# Patient Record
Sex: Male | Born: 1955 | Race: Asian | Hispanic: No | Marital: Married
Health system: Midwestern US, Community
[De-identification: ages and names within clinical notes are randomized; demographics above are authoritative.]

## PROBLEM LIST (undated history)

## (undated) DIAGNOSIS — R079 Chest pain, unspecified: Principal | ICD-10-CM

## (undated) DIAGNOSIS — L4050 Arthropathic psoriasis, unspecified: Secondary | ICD-10-CM

## (undated) DIAGNOSIS — M5030 Other cervical disc degeneration, unspecified cervical region: Secondary | ICD-10-CM

## (undated) DIAGNOSIS — I2510 Atherosclerotic heart disease of native coronary artery without angina pectoris: Secondary | ICD-10-CM

## (undated) DIAGNOSIS — T884XXA Failed or difficult intubation, initial encounter: Secondary | ICD-10-CM

## (undated) DIAGNOSIS — Z955 Presence of coronary angioplasty implant and graft: Secondary | ICD-10-CM

## (undated) DIAGNOSIS — Z87898 Personal history of other specified conditions: Secondary | ICD-10-CM

## (undated) DIAGNOSIS — Z8679 Personal history of other diseases of the circulatory system: Secondary | ICD-10-CM

## (undated) DIAGNOSIS — L409 Psoriasis, unspecified: Secondary | ICD-10-CM

## (undated) DIAGNOSIS — B0229 Other postherpetic nervous system involvement: Secondary | ICD-10-CM

## (undated) DIAGNOSIS — R1013 Epigastric pain: Secondary | ICD-10-CM

## (undated) DIAGNOSIS — R0789 Other chest pain: Secondary | ICD-10-CM

## (undated) DIAGNOSIS — Z0000 Encounter for general adult medical examination without abnormal findings: Secondary | ICD-10-CM

## (undated) DIAGNOSIS — R109 Unspecified abdominal pain: Secondary | ICD-10-CM

## (undated) DIAGNOSIS — M792 Neuralgia and neuritis, unspecified: Secondary | ICD-10-CM

## (undated) DIAGNOSIS — R202 Paresthesia of skin: Secondary | ICD-10-CM

## (undated) DIAGNOSIS — Z7184 Encounter for health counseling related to travel: Secondary | ICD-10-CM

## (undated) DIAGNOSIS — Z1211 Encounter for screening for malignant neoplasm of colon: Secondary | ICD-10-CM

## (undated) HISTORY — DX: Atherosclerotic heart disease of native coronary artery without angina pectoris: I25.10

## (undated) HISTORY — DX: Personal history of other specified conditions: Z87.898

## (undated) HISTORY — DX: Psoriasis, unspecified: L40.9

## (undated) HISTORY — DX: Other cervical disc degeneration, unspecified cervical region: M50.30

## (undated) HISTORY — PX: HERNIA REPAIR: SHX51

## (undated) HISTORY — DX: Arthropathic psoriasis, unspecified: L40.50

## (undated) HISTORY — DX: Personal history of other diseases of the circulatory system: Z86.79

## (undated) HISTORY — DX: Presence of coronary angioplasty implant and graft: Z95.5

## (undated) HISTORY — PX: COLONOSCOPY: SHX174

## (undated) HISTORY — PX: UPPER GI ENDOSCOPY: SHX6162

---
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
## 1995-11-04 HISTORY — PX: THORACOTOMY: SUR1349

## 1999-11-04 HISTORY — PX: PILONIDAL CYST / SINUS EXCISION: SUR543

## 2002-11-03 HISTORY — PX: HEMORRHOID SURGERY: SHX153

## 2007-11-04 HISTORY — PX: CYSTOSCOPY: SUR368

## 2012-04-27 MED ORDER — ETANERCEPT 50 MG/ML (0.98 ML) SUB-Q SYRINGE
50 mg/mL (1 mL) | INJECTION | SUBCUTANEOUS | Status: DC
Start: 2012-04-27 — End: 2018-01-01

## 2012-04-27 NOTE — Patient Instructions (Signed)
Well Visit, Men 50 to 65: After Your Visit  Your Care Instructions  Physical exams can help you stay healthy. Your doctor has checked your overall health and may have suggested ways to take good care of yourself. He or she also may have recommended tests. At home, you can help prevent illness with healthy eating, regular exercise, and other steps.  Follow-up care is a key part of your treatment and safety. Be sure to make and go to all appointments, and call your doctor if you are having problems. It's also a good idea to know your test results and keep a list of the medicines you take.  How can you care for yourself at home?  ?? Reach and stay at a healthy weight. This will lower your risk for many problems, such as obesity, diabetes, heart disease, and high blood pressure.   ?? Get at least 30 minutes of exercise on most days of the week. Walking is a good choice. You also may want to do other activities, such as running, swimming, cycling, or playing tennis or team sports.   ?? Do not smoke. Smoking can make health problems worse. If you need help quitting, talk to your doctor about stop-smoking programs and medicines. These can increase your chances of quitting for good.   ?? Always wear sunscreen on exposed skin. Make sure the sunscreen blocks ultraviolet rays (both UVA and UVB) and has a sun protection factor (SPF) of at least 15. Use it every day, even when it is cloudy. Some doctors may recommend a higher SPF, such as 30.   ?? See a dentist one or two times a year for checkups and to have your teeth cleaned.   ?? Wear a seat belt in the car.   ?? Limit alcohol to 2 drinks a day. Too much alcohol can cause health problems.   Follow your doctor's advice about when to have certain tests. These tests can spot problems early.  ?? Cholesterol. Your doctor will tell you how often to have this done based on your overall health and other things that can increase your risk for heart disease. Usually, an adult who has  coronary artery disease or diabetes should have cholesterol testing at least once a year. If you are being treated for high cholesterol, how often you have tests depends on your cholesterol level and the type of treatment.   ?? Blood pressure. Experts suggest that healthy adults with normal blood pressure (119/79 mm Hg or below) have their blood pressure checked at least every 1 to 2 years. This can be done during a routine doctor visit. If you have slightly higher or high blood pressure, or are at risk for heart disease, your doctor will suggest more frequent tests.   ?? Prostate exam. Talk to your doctor about whether and how often you should have a blood test (called a PSA test) for prostate cancer. Experts differ on how often men should have this test. They recommend that you discuss the benefits and risks of the test with your doctor.   ?? Diabetes. Ask your doctor whether you should have tests for diabetes.   ?? Vision. Some experts recommend that you have yearly exams for glaucoma and other age-related eye problems starting at age 50.   ?? Hearing. Tell your doctor if you notice any change in your hearing. You can have tests to find out how well you hear.   ?? Colon cancer. You should begin tests for colon cancer   at age 50. You may have one of several tests. Your doctor will tell you how often to have tests based on your age and risk. Risks include whether you already had a precancerous polyp removed from your colon or whether your parent, brother, sister, or child has had colon cancer.   ?? Coronary artery disease. Every 5 years, you should have your risks for heart disease assessed. This test uses factors such as your age, blood pressure, cholesterol, and whether you smoke or have diabetes to show what your risk for a heart attack is over the next 10 years.   ?? Abdominal aortic aneurysm. Ask your doctor whether you should have a test to check for an aneurysm. You may need a test if you ever smoked or if your  parent, brother, sister, or child has had an aneurysm.   When should you call for help?  Watch closely for changes in your health, and be sure to contact your doctor if you have any problems or symptoms that concern you.    Where can you learn more?    Go to http://www.healthwise.net/[facility]   Enter K916 in the search box to learn more about "Well Visit, Men 50 to 65: After Your Visit."    ?? 2006-2013 Healthwise, Inc[employer]. Care instructions adapted under license by [employer] (which disclaims liability or warranty for this information). This care instruction is for use with your licensed healthcare professional. If you have questions about a medical condition or this instruction, always ask your healthcare professional. Healthwise, Incorporated disclaims any warranty or liability for your use of this information.  Content Version: 9.7.130178; Last Revised: May 29, 2010

## 2012-04-27 NOTE — Progress Notes (Signed)
Patient here for wellness physical.  Patient's father history:  Cardiac  by pass and Abdominal aneurysm.

## 2012-04-27 NOTE — Progress Notes (Signed)
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 He has no complaints. He has history of psoriatic arthritis, stable at this time.    Patient Active Problem List   Diagnoses Code   ??? Psoriatic arthritis 696.0   ??? Vitamin d deficiency 268.9     ROS:  Feeling well. No dyspnea, no  chest pain.   No abdominal pain, change in bowel habits, black or bloody stools.   No urinary tract or prostatic symptoms.   No neurological complaints.    Specific concerns today: Needs refill for medications     Objective:  Blood pressure 123/82, pulse 60, temperature 97.7 ??F (36.5 ??C), temperature source Oral, resp. rate 16,   height 5' 8.25" (1.734 m),  weight 162 lb (73.483 kg), SpO2 98.00%.  The patient appears well, alert, oriented x 3, in no distress.   ENT:  Neck supple. No adenopathy or thyromegaly. PERL.   Lungs are clear with good air entry.  CVS:  S1 and S2 normal, no murmurs,   Abdomen: soft without tenderness, guarding, mass or organomegaly.   Skin: psoriatic lesions on face and trunk  GU exam: Deferred.   Ext: no edema, normal peripheral pulses.   Neurological: without focal findings.    Assessment/Plan:  1. Physical exam, annual  METABOLIC PANEL, COMPREHENSIVE, CBC WITH AUTOMATED DIFF, TSH, 3RD GENERATION, LIPID PANEL   2. Thyroid disorder screen  TSH, 3RD GENERATION   3. Screening cholesterol level  LIPID PANEL   4. Vitamin d deficiency  VITAMIN D, 1, 25 DIHYDROXY   5. Psoriatic arthritis  etanercept (ENBREL) 50 mg/mL (0.98 mL) injection

## 2012-04-27 NOTE — Telephone Encounter (Signed)
[facility] pharmacy called to get prior authorization for medication Enbrel 50 mg/ mL.  Patient was approved for one year :   Mar 28, 2012 to April 27, 2013.  Patient can receive 5 per month / 15 for 90 days.  [facility] was called and informed of approval.

## 2012-04-30 LAB — CBC WITH AUTOMATED DIFF
ABS. BASOPHILS: 0.1 10*3/uL (ref 0.0–0.2)
ABS. EOSINOPHILS: 0.3 10*3/uL (ref 0.0–0.4)
ABS. IMM. GRANS.: 0 10*3/uL (ref 0.0–0.1)
ABS. MONOCYTES: 0.5 10*3/uL (ref 0.1–1.0)
ABS. NEUTROPHILS: 3.2 10*3/uL (ref 1.8–7.8)
Abs Lymphocytes: 1.8 10*3/uL (ref 0.7–4.5)
BASOPHILS: 1 % (ref 0–3)
EOSINOPHILS: 5 % (ref 0–7)
HCT: 41.8 % (ref 37.5–51.0)
HGB: 13.9 g/dL (ref 12.6–17.7)
IMMATURE GRANULOCYTES: 0 % (ref 0–2)
Lymphocytes: 31 % (ref 14–46)
MCH: 30 pg (ref 26.6–33.0)
MCHC: 33.3 g/dL (ref 31.5–35.7)
MCV: 90 fL (ref 79–97)
MONOCYTES: 8 % (ref 4–13)
NEUTROPHILS: 55 % (ref 40–74)
PLATELET: 250 10*3/uL (ref 140–415)
RBC: 4.64 x10E6/uL (ref 4.14–5.80)
RDW: 13.9 % (ref 12.3–15.4)
WBC: 5.8 10*3/uL (ref 4.0–10.5)

## 2012-04-30 LAB — METABOLIC PANEL, COMPREHENSIVE
A-G Ratio: 1.7 (ref 1.1–2.5)
ALT (SGPT): 21 IU/L (ref 0–44)
AST (SGOT): 17 IU/L (ref 0–40)
Albumin: 4.5 g/dL (ref 3.5–5.5)
Alk. phosphatase: 62 IU/L (ref 25–150)
BUN/Creatinine ratio: 16 (ref 9–20)
BUN: 14 mg/dL (ref 6–24)
Bilirubin, total: 0.4 mg/dL (ref 0.0–1.2)
CO2: 26 mmol/L (ref 19–28)
Calcium: 9.5 mg/dL (ref 8.7–10.2)
Chloride: 101 mmol/L (ref 97–108)
Creatinine: 0.86 mg/dL (ref 0.76–1.27)
GFR est non-AA: 98 mL/min/{1.73_m2} (ref >59)
GLOBULIN, TOTAL: 2.7 g/dL (ref 1.5–4.5)
Glucose: 81 mg/dL (ref 65–99)
Potassium: 4.1 mmol/L (ref 3.5–5.2)
Protein, total: 7.2 g/dL (ref 6.0–8.5)
Sodium: 139 mmol/L (ref 134–144)
eGFR If African American: 113 mL/min/{1.73_m2} (ref >59)

## 2012-04-30 LAB — LIPID PANEL
Cholesterol, total: 201 mg/dL — ABNORMAL HIGH (ref 100–199)
HDL Cholesterol: 40 mg/dL (ref >39)
LDL, calculated: 124 mg/dL — ABNORMAL HIGH (ref 0–99)
Triglyceride: 186 mg/dL — ABNORMAL HIGH (ref 0–149)
VLDL, calculated: 37 mg/dL (ref 5–40)

## 2012-04-30 LAB — TSH 3RD GENERATION: TSH: 1.57 u[IU]/mL (ref 0.450–4.500)

## 2012-05-02 LAB — VITAMIN D, 1, 25 DIHYDROXY: Calcitriol (Vit D 1, 25 di-OH): 36.6 pg/mL (ref 10.0–75.0)

## 2012-05-04 NOTE — Progress Notes (Signed)
Quick Note:    Pls let pt know that his triglyceride and LDL components are elevated. He needs to come for discussion, or we can start him on treatment. He also needs to begin lifestyle modification( low fat/cholesterol diet and exercise).  ______

## 2012-05-07 NOTE — Progress Notes (Signed)
Quick Note:    Patient was called and left voice mail that lab results are available on MyChart , any questions to call our office. Informed that will send out lab note via mail to him at address on file.  ______

## 2014-08-22 ENCOUNTER — Encounter: Attending: Internal Medicine | Primary: Internal Medicine

## 2014-09-19 ENCOUNTER — Encounter: Attending: Internal Medicine | Primary: Internal Medicine

## 2014-10-24 ENCOUNTER — Encounter: Attending: Internal Medicine | Primary: Internal Medicine

## 2014-11-28 ENCOUNTER — Encounter: Attending: Internal Medicine | Primary: Internal Medicine

## 2015-01-16 ENCOUNTER — Ambulatory Visit
Admit: 2015-01-16 | Discharge: 2015-01-16 | Payer: PRIVATE HEALTH INSURANCE | Attending: Family | Primary: Internal Medicine

## 2015-01-16 ENCOUNTER — Inpatient Hospital Stay: Admit: 2015-01-16 | Payer: PRIVATE HEALTH INSURANCE | Primary: Internal Medicine

## 2015-01-16 DIAGNOSIS — Z8611 Personal history of tuberculosis: Secondary | ICD-10-CM

## 2015-01-16 DIAGNOSIS — J101 Influenza due to other identified influenza virus with other respiratory manifestations: Secondary | ICD-10-CM

## 2015-01-16 LAB — AMB POC SOFIA INFLUENZA A/B TEST
Influenza A Ag POC: NEGATIVE Pos/Neg
Influenza B Ag POC: POSITIVE Pos/Neg

## 2015-01-16 LAB — AMB POC RAPID STREP A: Group A Strep Ag: NEGATIVE

## 2015-01-16 MED ORDER — OSELTAMIVIR PHOSPHATE 75 MG CAP
75 mg | ORAL_CAPSULE | Freq: Two times a day (BID) | ORAL | Status: AC
Start: 2015-01-16 — End: 2015-01-21

## 2015-01-16 NOTE — Patient Instructions (Signed)
Influenza (Flu): Care Instructions  Your Care Instructions  Influenza (flu) is an infection in the lungs and breathing passages. It is caused by the influenza virus. There are different strains, or types, of the flu virus from year to year. Unlike the common cold, the flu comes on suddenly and the symptoms, such as a cough, congestion, fever, chills, fatigue, aches, and pains, are more severe. These symptoms may last up to 10 days. Although the flu can make you feel very sick, it usually doesn't cause serious health problems.  Home treatment is usually all you need for flu symptoms. But your doctor may prescribe antiviral medicine to prevent other health problems, such as pneumonia, from developing. Older people and those who have a long-term health condition, such as lung disease, are most at risk for having pneumonia or other health problems.  Follow-up care is a key part of your treatment and safety. Be sure to make and go to all appointments, and call your doctor if you are having problems. It???s also a good idea to know your test results and keep a list of the medicines you take.  How can you care for yourself at home?  ?? Get plenty of rest.  ?? Drink plenty of fluids, enough so that your urine is light yellow or clear like water. If you have kidney, heart, or liver disease and have to limit fluids, talk with your doctor before you increase the amount of fluids you drink.  ?? Take an over-the-counter pain medicine if needed, such as acetaminophen (Tylenol), ibuprofen (Advil, Motrin), or naproxen (Aleve), to relieve fever, headache, and muscle aches. Read and follow all instructions on the label. No one younger than 20 should take aspirin. It has been linked to Reye syndrome, a serious illness.  ?? Do not smoke. Smoking can make the flu worse. If you need help quitting, talk to your doctor about stop-smoking programs and medicines. These can increase your chances of quitting for good.   ?? Breathe moist air from a hot shower or from a sink filled with hot water to help clear a stuffy nose.  ?? Before you use cough and cold medicines, check the label. These medicines may not be safe for young children or for people with certain health problems.  ?? If the skin around your nose and lips becomes sore, put some petroleum jelly on the area.  ?? To ease coughing:  ?? Drink fluids to soothe a scratchy throat.  ?? Suck on cough drops or plain hard candy.  ?? Take an over-the-counter cough medicine that contains dextromethorphan to help you get some sleep. Read and follow all instructions on the label.  ?? Raise your head at night with an extra pillow. This may help you rest if coughing keeps you awake.  ?? Take any prescribed medicine exactly as directed. Call your doctor if you think you are having a problem with your medicine.  To avoid spreading the flu  ?? Wash your hands regularly, and keep your hands away from your face.  ?? Stay home from school, work, and other public places until you are feeling better and your fever has been gone for at least 24 hours. The fever needs to have gone away on its own without the help of medicine.  ?? Ask people living with you to talk to their doctors about preventing the flu. They may get antiviral medicine to keep from getting the flu from you.  ?? To prevent the flu in the   future, get a flu vaccine every fall. Encourage people living with you to get the vaccine.  ?? Cover your mouth when you cough or sneeze.  When should you call for help?  Call 911 anytime you think you may need emergency care. For example, call if:  ?? You have severe trouble breathing.  Call your doctor now or seek immediate medical care if:  ?? You have new or worse trouble breathing.  ?? You seem to be getting much sicker.  ?? You feel very sleepy or confused.  ?? You have a new or higher fever.  ?? You get a new rash.  Watch closely for changes in your health, and be sure to contact your doctor if:   ?? You begin to get better and then get worse.  ?? You are not getting better after 1 week.   Where can you learn more?   Go to http://www.healthwise.net/[facility]  Enter L652 in the search box to learn more about "Influenza (Flu): Care Instructions."   ?? 2006-2015 Healthwise, Incorporated. Care instructions adapted under license by [employer] (which disclaims liability or warranty for this information). This care instruction is for use with your licensed healthcare professional. If you have questions about a medical condition or this instruction, always ask your healthcare professional. Healthwise, Incorporated disclaims any warranty or liability for your use of this information.  Content Version: 10.7.482551; Current as of: June 23, 2014

## 2015-01-16 NOTE — Progress Notes (Signed)
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 Temp max over weekend 102.3. Last dose of Tylenol yesterday evening.  He denies dyspnea or wheezing.  Smoking status: non-smoker.    Flu vaccine status: vaccinated currently.  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    Review of Systems  Pertinent items are noted in HPI.    Patient Active Problem List   Diagnosis Code   ??? Psoriatic arthritis (HCC) L40.50   ??? Vitamin d deficiency E55.9     Current Outpatient Prescriptions   Medication Sig Dispense Refill   ??? oseltamivir (TAMIFLU) 75 mg capsule Take 1 Cap by mouth two (2) times a day for 5 days. 10 Cap 0   ??? etanercept (ENBREL) 50 mg/mL (0.98 mL) injection 50 mg by SubCUTAneous route every seven (7) days. 4 Syringe 4     No Known Allergies  Past Medical History   Diagnosis Date   ??? Arthritis    ??? Psoriatic arthritis [facility](HCC)      Past Surgical History   Procedure Laterality Date   ??? Hx hernia repair     ??? Hx thoracotomy       Family History   Problem Relation Age of Onset   ??? Family history unknown: Yes     History   Substance Use Topics   ??? Smoking status: Never Smoker    ??? Smokeless tobacco: Not on file   ??? Alcohol Use: Yes      Comment: Social       Objective:   BP 102/68 mmHg   Pulse 97   Temp(Src) 100.3 ??F (37.9 ??C) (Oral)   Resp 17   Ht 5' 8.31" (1.735 m)   Wt 160 lb (72.576 kg)   BMI 24.11 kg/m2   SpO2 97%    Appears moderately ill but not toxic; temperature as noted in vitals.   Throat and pharynx erythematous.  Neck supple. No adenopathy in the neck.   Heart sounds S1 & S2 normal.  The chest is clear.    Assessment/Plan:   Rapid flu positive type B, rapid strep negative  Start Tamiflu  Symptomatic therapy suggested: rest, increase fluids, OTC acetaminophen.   Call or return to clinic prn if these symptoms worsen or fail to improve as anticipated.     ICD-10-CM ICD-9-CM     1. Influenza B J10.1 487.1 oseltamivir (TAMIFLU) 75 mg capsule   2. History of TB (tuberculosis) Z86.11 V12.01 XR CHEST PA LAT   .

## 2015-01-16 NOTE — Addendum Note (Signed)
Addended by: Lorane GellSTANLEY, Zacarias Krauter L on: 01/16/2015 10:48 AM      Modules accepted: Orders

## 2015-01-16 NOTE — Progress Notes (Signed)
Chief Complaint   Patient presents with   ??? Cough     pt c/o cough that started Friday and fever of 102.3 that started Sunday with generalized body aches.

## 2015-01-18 NOTE — Telephone Encounter (Signed)
Patient called about xray results from Tuesday, March 15. He would also like to speak with Normajean BaxterMelissa Stanley about releasing him back to work. He can be reached on his cell phone at [phone].    Thanks!   Whitney

## 2015-01-18 NOTE — Telephone Encounter (Signed)
Follow up call from Noralee Spaceavi Sahota, MD for visit on 3.15.16. Pt verified self and birth date for privacy precautions. Patient states he is feeling better and would like to return to work sooner then expected. He was informed he needs to make an appointment and be seen by EWS to be cleared. Pt was also advised per MLS, NP his xray results were normal. Daleen BoRavi was advised the office is open Monday through Friday 8AM until 6PM should he need to be seen again. Patient verbalizes understanding.

## 2015-01-24 NOTE — Telephone Encounter (Signed)
Left detailed message on home number listed.  Ashyr Hedgepath W Eryka Dolinger, LPN

## 2015-01-25 NOTE — Telephone Encounter (Signed)
No return call as of now.  Kary Colaizzi W Brock Mokry, LPN

## 2015-02-13 ENCOUNTER — Ambulatory Visit
Admit: 2015-02-13 | Discharge: 2015-02-13 | Payer: PRIVATE HEALTH INSURANCE | Attending: Internal Medicine | Primary: Internal Medicine

## 2015-02-13 DIAGNOSIS — Z0000 Encounter for general adult medical examination without abnormal findings: Secondary | ICD-10-CM

## 2015-02-13 NOTE — Progress Notes (Signed)
RM #[id number]    Chief Complaint   Patient presents with   ??? Establish Care     Patient is not fasting.    Patient [facility]     Patient states no advance directive. Information given with AVS.

## 2015-02-13 NOTE — Patient Instructions (Addendum)
Reviewed 10-year ASCVD risk with ASCVD calculator--currently with:  Age 59yo; Total Cholesterol 201; HDL40; SBP 123, no-treated BP, no-- diabetes mellitus, non-smoker, risk 8.1%.  With lipid panel 2013.  Usually goal is 7.5% or lower.    Please obtain labs at one of the [facility] locations as reviewed.  Can review by phone or in MyChart once completed.      Please have colonoscopy report sent to [place] once you complete.    If you can provide [place] a copy of your employee health vaccines and screenings, can update in your computer record also.    Advance Directives: Care Instructions  Your Care Instructions  An advance directive is a legal way to state your wishes at the end of your life. It tells your family and your doctor what to do if you can no longer say what you want.  There are two main types of advance directives. You can change them any time that your wishes change.  ?? A living will tells your family and your doctor your wishes about life support and other treatment.  ?? A medical power of attorney lets you name a person to make treatment decisions for you when you can't speak for yourself. This person is called a health care agent.  If you do not have an advance directive, decisions about your medical care may be made by a doctor or a judge who doesn't know you.  It may help to think of an advance directive as a gift to the people who care for you. If you have one, they won't have to make tough decisions by themselves.  Follow-up care is a key part of your treatment and safety. Be sure to make and go to all appointments, and call your doctor if you are having problems. It's also a good idea to know your test results and keep a list of the medicines you take.  How can you care for yourself at home?  ?? Discuss your wishes with your loved ones and your doctor. This way, there are no surprises.  ?? Many states have a unique form. Or you might use a universal form that  has been approved by many states. This kind of form can sometimes be completed and stored online. Your electronic copy will then be available wherever you have a connection to the Internet. In most cases, doctors will respect your wishes even if you have a form from a different state.  ?? You don't need a lawyer to do an advance directive. But you may want to get legal advice.  ?? Think about these questions when you prepare an advance directive:  ?? Who do you want to make decisions about your medical care if you are not able to? Many people choose a family member, close friend, or doctor.  ?? Do you know enough about life support methods that might be used? If not, talk to your doctor so you understand.  ?? What are you most afraid of that might happen? You might be afraid of having pain, losing your independence, or being kept alive by machines.  ?? Where would you prefer to die? Choices include your home, a hospital, or a nursing home.  ?? Would you like to have information about hospice care to support you and your family?  ?? Do you want to donate organs when you die?  ?? Do you want certain religious practices performed before you die? If so, put your wishes in the advance  directive.  ?? Read your advance directive every year, and make changes as needed.  When should you call for help?  Be sure to contact your doctor if you have any questions.   Where can you learn more?   Go to Metropolitan[employer]  Enter R264 in the search box to learn more about "Advance Directives: Care Instructions."   ?? 2006-2015 Healthwise, Incorporated. Care instructions adapted under license by [employer] (which disclaims liability or warranty for this information). This care instruction is for use with your licensed healthcare professional. If you have questions about a medical condition or this instruction, always ask your healthcare professional. Healthwise,  Incorporated disclaims any warranty or liability for your use of this information.  Content Version: 10.7.482551; Current as of: December 23, 2013              Well Visit, Men 50 to 76: Care Instructions  Your Care Instructions  Physical exams can help you stay healthy. Your doctor has checked your overall health and may have suggested ways to take good care of yourself. He or she also may have recommended tests. At home, you can help prevent illness with healthy eating, regular exercise, and other steps.  Follow-up care is a key part of your treatment and safety. Be sure to make and go to all appointments, and call your doctor if you are having problems. It's also a good idea to know your test results and keep a list of the medicines you take.  How can you care for yourself at home?  ?? Reach and stay at a healthy weight. This will lower your risk for many problems, such as obesity, diabetes, heart disease, and high blood pressure.  ?? Get at least 30 minutes of exercise on most days of the week. Walking is a good choice. You also may want to do other activities, such as running, swimming, cycling, or playing tennis or team sports.  ?? Do not smoke. Smoking can make health problems worse. If you need help quitting, talk to your doctor about stop-smoking programs and medicines. These can increase your chances of quitting for good.  ?? Always wear sunscreen on exposed skin. Make sure to use a broad-spectrum sunscreen that has a sun protection factor (SPF) of 30 or higher. Use it every day, even when it is cloudy.  ?? See a dentist one or two times a year for checkups and to have your teeth cleaned.  ?? Wear a seat belt in the car.  ?? Limit alcohol to 2 drinks a day. Too much alcohol can cause health problems.  Follow your doctor's advice about when to have certain tests. These tests can spot problems early.  ?? Cholesterol. Your doctor will tell you how often to have this done based  on your overall health and other things that can increase your risk for heart attack and stroke.  ?? Blood pressure. You will likely have your blood pressure checked at any visit to your doctor. If you are healthy and have a blood pressure below 120/80 mm Hg, have your blood pressure checked at least every 1 to 2 years. This can be done during a routine doctor visit. If you have slightly higher or high blood pressure, or are at risk for heart disease, your doctor will suggest more frequent tests.  ?? Prostate exam. Talk to your doctor about whether you should have a blood test (called a PSA test) for prostate cancer. Experts disagree on whether men should have this  test. Some experts recommend that you discuss the benefits and risks of the test with your doctor.  ?? Diabetes. Ask your doctor whether you should have tests for diabetes.  ?? Vision. Some experts recommend that you have yearly exams for glaucoma and other age-related eye problems starting at age 59.  ?? Hearing. Tell your doctor if you notice any change in your hearing. You can have tests to find out how well you hear.  ?? Colon cancer. You should begin tests for colon cancer at age 59. You may have one of several tests. Your doctor will tell you how often to have tests based on your age and risk. Risks include whether you already had a precancerous polyp removed from your colon or whether your parent, brother, sister, or child has had colon cancer.  ?? Heart attack and stroke risk. At least every 4 to 6 years, you should have your risk for heart attack and stroke assessed. Your doctor uses factors such as your age, blood pressure, cholesterol, and whether you smoke or have diabetes to show what your risk for a heart attack or stroke is over the next 10 years.  ?? Abdominal aortic aneurysm. Ask your doctor whether you should have a test to check for an aneurysm. You may need a test if you ever smoked or  if your parent, brother, sister, or child has had an aneurysm.  When should you call for help?  Watch closely for changes in your health, and be sure to contact your doctor if you have any problems or symptoms that concern you.   Where can you learn more?   Go to [email or web address].[email or web address].net/[email or web address]  Enter [phone] [id number] in the search box to learn more about "Well Visit, Men 50 to 65: Care Instructions."   ?? 2006-2015 Healthwise, Incorporated. Care instructions adapted under license by [employer] (which disclaims liability or warranty for this information). This care instruction is for use with your licensed healthcare professional. If you have questions about a medical condition or this instruction, always ask your healthcare professional. Healthwise, Incorporated disclaims any warranty or liability for your use of this information.  Content Version: 10.7.482551; Current as of: December 23, 2013

## 2015-02-13 NOTE — Progress Notes (Signed)
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    HPI  Here to establish care.    He does labs with Rheum--Dr. Coutlakis.    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    Reviewed 10-year ASCVD risk with ASCVD calculator--currently with:  Age 89yo; Total Cholesterol 201; HDL40; SBP 123, no-treated BP, no-- diabetes mellitus, non-smoker, risk 8.1%.  With lipid panel 2013.  Plan review with updated lipids as below.    Past Medical History   Diagnosis Date   ??? Arthritis    ??? Psoriatic arthritis (HCC)      Dr. Towanda Malkinoutlakis   ??? 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  Last seizure 1992-1993.   ??? Right lateral epicondylitis          ROS      Blood pressure 123/75, pulse 78, temperature 98.2 ??F (36.8 ??C), temperature source Oral, resp. rate 16, height 5' 8.5" (1.74 m), weight 159 lb 9.6 oz (72.394 kg), SpO2 97 %.    Physical Exam   Constitutional: He appears well-developed and well-nourished. No distress.   HENT:   Head: Normocephalic and atraumatic.   Mouth/Throat: Oropharynx is clear and moist. No oropharyngeal exudate.   Eyes: Conjunctivae are normal. Right eye exhibits no discharge. Left eye exhibits no discharge. No scleral icterus.   Undilated fundoscopic exam normal bilaterally.   Neck: Normal range of motion. Neck supple. No tracheal deviation present. No thyromegaly present.   Cardiovascular: Normal rate, regular rhythm, normal heart sounds and intact distal pulses.  Exam reveals no gallop and no friction rub.    No murmur heard.  Pulmonary/Chest: Effort normal and breath sounds normal. No stridor. No respiratory distress. He has no wheezes. He has no rales.   Abdominal: Soft. Bowel sounds are normal. He exhibits no distension. There is no tenderness. There is no rebound and no guarding.   Genitourinary:   Pt deferred.   Musculoskeletal: He exhibits no edema or tenderness.   Lymphadenopathy:     He has no cervical adenopathy.    Neurological: He is alert. He exhibits normal muscle tone. Coordination normal.   Skin: Skin is warm. No rash noted. He is not diaphoretic. No erythema. No pallor.   Psychiatric: He has a normal mood and affect. His behavior is normal. Judgment and thought content normal.       ASSESSMENT and PLAN    ICD-10-CM ICD-9-CM    1. Well adult exam Z00.00 V70.0 CBC WITH AUTOMATED DIFF      METABOLIC PANEL, COMPREHENSIVE      HEMOGLOBIN A1C      LIPID PANEL      CK   2. FH: CAD (coronary artery disease) Z82.49 V17.3 REFERRAL TO CARDIOLOGY      HEMOGLOBIN A1C      LIPID PANEL      CK    Father with CABG at 6760; AAA at 8447yr old; non-smoker.   3. Screening for colon cancer Z12.11 V76.51 REFERRAL TO GASTROENTEROLOGY   4. Screening for prostate cancer Z12.5 V76.44 PSA W/ REFLX FREE PSA       2.  Cardiology referral reviewed if pt interested.    3,4.  Screening reviewed.      Follow-up Disposition:  Return in about 1 year (around 02/13/2016), or if symptoms worsen or fail to improve, for yearly physical.  lab results and schedule of future lab studies reviewed with patient  reviewed  diet, exercise and weight control  reviewed medications and side effects in detail    For additional documentation of information and/or recommendations discussed this visit, please see notes in instructions.

## 2015-03-07 ENCOUNTER — Encounter

## 2015-03-08 ENCOUNTER — Inpatient Hospital Stay: Admit: 2015-03-08 | Payer: PRIVATE HEALTH INSURANCE | Attending: Gastroenterology | Primary: Internal Medicine

## 2015-03-08 DIAGNOSIS — R109 Unspecified abdominal pain: Secondary | ICD-10-CM

## 2015-03-08 LAB — CBC WITH AUTOMATED DIFF
ABS. BASOPHILS: 0.1 10*3/uL (ref 0.0–0.2)
ABS. EOSINOPHILS: 0.2 10*3/uL (ref 0.0–0.4)
ABS. IMM. GRANS.: 0 10*3/uL (ref 0.0–0.1)
ABS. MONOCYTES: 0.4 10*3/uL (ref 0.1–0.9)
ABS. NEUTROPHILS: 3.5 10*3/uL (ref 1.4–7.0)
Abs Lymphocytes: 1.9 10*3/uL (ref 0.7–3.1)
BASOPHILS: 1 %
EOSINOPHILS: 4 %
HCT: 42.5 % (ref 37.5–51.0)
HGB: 14 g/dL (ref 12.6–17.7)
IMMATURE GRANULOCYTES: 0 %
Lymphocytes: 31 %
MCH: 29.9 pg (ref 26.6–33.0)
MCHC: 32.9 g/dL (ref 31.5–35.7)
MCV: 91 fL (ref 79–97)
MONOCYTES: 7 %
NEUTROPHILS: 57 %
PLATELET: 291 10*3/uL (ref 150–379)
RBC: 4.69 x10E6/uL (ref 4.14–5.80)
RDW: 13.9 % (ref 12.3–15.4)
WBC: 6.1 10*3/uL (ref 3.4–10.8)

## 2015-03-08 LAB — LIPID PANEL
Cholesterol, total: 189 mg/dL (ref 100–199)
HDL Cholesterol: 39 mg/dL — ABNORMAL LOW (ref >39)
LDL, calculated: 114 mg/dL — ABNORMAL HIGH (ref 0–99)
Triglyceride: 181 mg/dL — ABNORMAL HIGH (ref 0–149)
VLDL, calculated: 36 mg/dL (ref 5–40)

## 2015-03-08 LAB — METABOLIC PANEL, COMPREHENSIVE
A-G Ratio: 1.8 (ref 1.1–2.5)
ALT (SGPT): 18 IU/L (ref 0–44)
AST (SGOT): 18 IU/L (ref 0–40)
Albumin: 4.5 g/dL (ref 3.5–5.5)
Alk. phosphatase: 64 IU/L (ref 39–117)
BUN/Creatinine ratio: 15 (ref 9–20)
BUN: 11 mg/dL (ref 6–24)
Bilirubin, total: 0.4 mg/dL (ref 0.0–1.2)
CO2: 26 mmol/L (ref 18–29)
Calcium: 9.7 mg/dL (ref 8.7–10.2)
Chloride: 101 mmol/L (ref 97–108)
Creatinine: 0.73 mg/dL — ABNORMAL LOW (ref 0.76–1.27)
GLOBULIN, TOTAL: 2.5 g/dL (ref 1.5–4.5)
Glucose: 84 mg/dL (ref 65–99)
Potassium: 4.3 mmol/L (ref 3.5–5.2)
Protein, total: 7 g/dL (ref 6.0–8.5)
Sodium: 142 mmol/L (ref 134–144)

## 2015-03-08 LAB — PSA W/ REFLX FREE PSA: Prostate Specific Ag: 0.7 ng/mL (ref 0.0–4.0)

## 2015-03-08 LAB — HEMOGLOBIN A1C W/O EAG: Hemoglobin A1c: 5.6 % (ref 4.8–5.6)

## 2015-03-08 LAB — CK: Creatine Kinase,Total: 124 U/L (ref 24–204)

## 2015-03-20 ENCOUNTER — Inpatient Hospital Stay: Payer: PRIVATE HEALTH INSURANCE

## 2015-03-20 MED ORDER — SODIUM CHLORIDE 0.9 % IJ SYRG
INTRAMUSCULAR | Status: DC | PRN
Start: 2015-03-20 — End: 2015-03-20

## 2015-03-20 MED ORDER — SODIUM CHLORIDE 0.9 % IJ SYRG
Freq: Three times a day (TID) | INTRAMUSCULAR | Status: DC
Start: 2015-03-20 — End: 2015-03-20

## 2015-03-20 MED ORDER — FLUMAZENIL 0.1 MG/ML IV SOLN
0.1 mg/mL | INTRAVENOUS | Status: DC | PRN
Start: 2015-03-20 — End: 2015-03-20

## 2015-03-20 MED ORDER — PROPOFOL 10 MG/ML IV EMUL
10 mg/mL | INTRAVENOUS | Status: DC | PRN
Start: 2015-03-20 — End: 2015-03-20
  Administered 2015-03-20 (×25): via INTRAVENOUS

## 2015-03-20 MED ORDER — PHENYLEPHRINE 10 MG/ML INJECTION
10 mg/mL | INTRAMUSCULAR | Status: DC | PRN
Start: 2015-03-20 — End: 2015-03-20
  Administered 2015-03-20 (×2): via INTRAVENOUS

## 2015-03-20 MED ORDER — NALOXONE 0.4 MG/ML INJECTION
0.4 mg/mL | INTRAMUSCULAR | Status: DC | PRN
Start: 2015-03-20 — End: 2015-03-20

## 2015-03-20 MED ORDER — FENTANYL CITRATE (PF) 50 MCG/ML IJ SOLN
50 mcg/mL | INTRAMUSCULAR | Status: DC | PRN
Start: 2015-03-20 — End: 2015-03-20

## 2015-03-20 MED ORDER — ATROPINE 0.1 MG/ML SYRINGE
0.1 mg/mL | Freq: Once | INTRAMUSCULAR | Status: DC | PRN
Start: 2015-03-20 — End: 2015-03-20

## 2015-03-20 MED ORDER — MIDAZOLAM 1 MG/ML IJ SOLN
1 mg/mL | INTRAMUSCULAR | Status: DC | PRN
Start: 2015-03-20 — End: 2015-03-20

## 2015-03-20 MED ORDER — SODIUM CHLORIDE 0.9 % IV
INTRAVENOUS | Status: DC
Start: 2015-03-20 — End: 2015-03-20

## 2015-03-20 MED ORDER — EPINEPHRINE 0.1 MG/ML SYRINGE
0.1 mg/mL | Freq: Once | INTRAMUSCULAR | Status: DC | PRN
Start: 2015-03-20 — End: 2015-03-20

## 2015-03-20 MED ORDER — SIMETHICONE 40 MG/0.6 ML ORAL DROPS, SUSP
40 mg/0.6 mL | ORAL | Status: DC | PRN
Start: 2015-03-20 — End: 2015-03-20

## 2015-03-20 MED ORDER — SODIUM CHLORIDE 0.9 % IV
INTRAVENOUS | Status: DC | PRN
Start: 2015-03-20 — End: 2015-03-20
  Administered 2015-03-20 (×2): via INTRAVENOUS

## 2015-03-20 MED FILL — SODIUM CHLORIDE 0.9 % IV: INTRAVENOUS | Qty: 1000

## 2015-03-20 MED FILL — DIPRIVAN 10 MG/ML INTRAVENOUS EMULSION: 10 mg/mL | INTRAVENOUS | Qty: 420

## 2015-03-20 MED FILL — SODIUM CHLORIDE 0.9 % IV: INTRAVENOUS | Qty: 500

## 2015-03-20 MED FILL — VAZCULEP 10 MG/ML INJECTION SOLUTION: 10 mg/mL | INTRAMUSCULAR | Qty: 80

## 2015-03-20 NOTE — Other (Signed)
Anthony Spaceavi Haskett, MD  1955-11-29  [id number]    Situation:  Verbal report received from: Zella RicherHadassah Martin  Procedure: Procedure(s):  ESOPHAGOGASTRODUODENOSCOPY (EGD)  COLONOSCOPY  ESOPHAGOGASTRODUODENAL (EGD) BIOPSY    Background:    Preoperative diagnosis: Abdominal pain  Screening colonoscopy  Postoperative diagnosis: 1. Normal EGD  2. Normal Colon    Operator:  Dr. Janetta HoraAbou-Assi  Assistant(s): Endoscopy Technician-1: Barkley BoardsJoe Durham  Endoscopy RN-1: Red ChristiansHadassah B Martin, RN    Specimens:   ID Type Source Tests Collected by Time Destination   1 : Duodenal Biopsy Preservative   Wilfred CurtisSouheil Abou-Assi, MD 03/20/2015 1017 Pathology   2 : Gastric Biopsy Preservative   Souheil Abou-Assi, MD 03/20/2015 1017 Pathology     H. Pylori  no    Assessment:  Intra-procedure medications       Anesthesia gave intra-procedure sedation and medications, see anesthesia flow sheet yes    Intravenous fluids: NS@ KVO     Vital signs stable  yes    Abdominal assessment: round and soft  yes    Recommendation:  Discharge patient per MD order yes.    Family   Permission to share finding with family  yes

## 2015-03-20 NOTE — Anesthesia Post-Procedure Evaluation (Signed)
Post-Anesthesia Evaluation and Assessment    Patient: Anthony SpaceRavi Reposa, MD MRN: [id number]  SSN: [id number]    Date of Birth: 1956/05/28  Age: 59 y.o.  Sex: male       Cardiovascular Function/Vital Signs  Visit Vitals   Item Reading   ??? BP 118/89 mmHg   ??? Pulse 69   ??? Temp 36.8 ??C (98.3 ??F)   ??? Resp 15   ??? SpO2 100%       Patient is status post MAC anesthesia for Procedure(s):  ESOPHAGOGASTRODUODENOSCOPY (EGD)  COLONOSCOPY  ESOPHAGOGASTRODUODENAL (EGD) BIOPSY.    Nausea/Vomiting: None    Postoperative hydration reviewed and adequate.    Pain:  Pain Scale 1: Numeric (0 - 10) (03/20/15 1117)  Pain Intensity 1: 0 (03/20/15 1117)   Managed    Neurological Status:       At baseline    Mental Status and Level of Consciousness: Alert and oriented     Pulmonary Status:   O2 Device: Room air (03/20/15 1117)   Adequate oxygenation and airway patent    Complications related to anesthesia: None    Post-anesthesia assessment completed. No concerns    Signed By: Rexene EdisonMary K Young Brim, MD     Mar 20, 2015

## 2015-03-20 NOTE — Anesthesia Pre-Procedure Evaluation (Signed)
Anesthetic History   No history of anesthetic complications            Review of Systems / Medical History  Patient summary reviewed, nursing notes reviewed and pertinent labs reviewed    Pulmonary  Within defined limits                 Neuro/Psych     seizures         Cardiovascular  Within defined limits                     GI/Hepatic/Renal  Within defined limits              Endo/Other        Arthritis     Other Findings   Comments: Remote sz history  Psoriasis  Psoriatic arthritis               Physical Exam    Airway  Mallampati: II  TM Distance: > 6 cm  Neck ROM: normal range of motion   Mouth opening: Normal     Cardiovascular  Regular rate and rhythm,  S1 and S2 normal,  no murmur, click, rub, or gallop             Dental  No notable dental hx       Pulmonary  Breath sounds clear to auscultation               Abdominal  GI exam deferred       Other Findings            Anesthetic Plan    ASA: 2  Anesthesia type: MAC          Induction: Intravenous  Anesthetic plan and risks discussed with: Patient

## 2015-03-20 NOTE — Procedures (Signed)
Procedures  by Wilfred CurtisAbou-Assi, Ashlay Altieri, MD at 03/20/15 1043                Author: Wilfred CurtisAbou-Assi, Muadh Creasy, MD  Service: Gastroenterology  Author Type: Physician       Filed: 03/20/15 1044  Date of Service: 03/20/15 1043  Status: Signed          Editor: Wilfred CurtisAbou-Assi, Hiba Garry, MD (Physician)            Pre-procedure Diagnoses        1. Personal history of colonic polyps [Z86.010]                           Post-procedure Diagnoses        1. Personal history of colonic polyps [Z86.010]                           Procedures        1. COLONOSCOPY,DIAGNOSTIC [[id number]][[id number]]                              [facility]   [address]   [place], [place] [id number]         Colonoscopy Operative Report      Noralee SpaceRavi Pilat, MD   [id number]   1956-02-24         Procedure Type:   Colonoscopy --diagnostic       Indications:    Personal history of colon polyps (screening only)    Date of last colonoscopy: 5 years, Polyps  Yes      Pre-operative Diagnosis: see indication above      Post-operative Diagnosis:  See findings below      Operator:  Wilfred CurtisSouheil  Abou-Assi, MD         Referring Provider: Donzetta MattersLARENCE G CHILDRESS, MD         Sedation:  MAC anesthesia Propofol         Procedure Details:  After informed consent was obtained with all risks and benefits of procedure explained and preoperative exam completed,  the patient was taken to the endoscopy suite and placed in the left lateral decubitus position.  Upon sequential sedation as per above, a digital rectal exam was performed demonstrating internal  hemorrhoids.  The Olympus videocolonoscope  was inserted in the rectum and carefully advanced to the cecum, which was identified by the ileocecal valve and appendiceal orifice .  The cecum was identified by the ileocecal valve and appendiceal orifice.  The quality of preparation was excellent.  The colonoscope was slowly withdrawn with careful evaluation between folds. Retroflexion in the rectum was completed .       Findings:     Rectum: normal   Sigmoid: normal   Descending Colon: normal   Transverse Colon: normal   Ascending Colon: normal   Cecum: normal   Terminal Ileum: normal         Specimen Removed:  none      Complications: None.       EBL:  None.      Impression:    normal colonic mucosa throughout      Recommendations: --Repeat colonoscopy in 5 years.        F/u with me in 6 to 8 weeks         Signed By:  Wilfred CurtisSouheil  Abou-Assi, MD  03/20/2015  10:43 AM

## 2015-03-20 NOTE — Procedures (Signed)
[place] [facility]  [address]  [place], [place] [id number]        [facility]- Gastroduodenoscopy (EGD) Procedure Note    Noralee SpaceRavi Mosco, MD  1956-01-01  [id number]      Procedure: Endoscopic Gastroduodenoscopy with biopsy    Indication:  Abdominal pain, epigastric , h/o gastritis and intestinal dysplasia    Pre-operative Diagnosis: see indication above    Post-operative Diagnosis: see findings below    Operator: Wilfred CurtisSouheil  Abou-Assi, MD    Referring Provider:  Donzetta MattersLARENCE G CHILDRESS, MD      Anesthesia/Sedation:  MAC anesthesia Propofol        Procedure Details     After infomed consent was obtained for the procedure, with all risks and benefits of procedure explained the patient was taken to the endoscopy suite and placed in the left lateral decubitus position.  Following sequential administration of sedation as per above, the endoscope was inserted into the mouth and advanced under direct vision to third portion of the duodenum.  A careful inspection was made as the gastroscope was withdrawn, including a retroflexed view of the proximal stomach; findings and interventions are described below.      Findings:   Esophagus:normal  Stomach: normal , random biopsies taken from antrum  Duodenum/jejunum: normal, random biopsies taken      Therapies:  none    Specimens: as above         EBL: None      Complications:   None; patient tolerated the procedure well.           Impression:    -See post-procedure diagnoses.    Recommendations:  -colonoscopy today    Signed By: Wilfred CurtisSouheil  Abou-Assi, MD     03/20/2015  10:21 AM

## 2015-03-20 NOTE — Procedures (Signed)
[place] [facility]  [address]  [place], [place] [id number]      Colonoscopy Operative Report    Noralee SpaceRavi Huberty, MD  [id number]  1956-04-21      Procedure Type:   Colonoscopy --diagnostic     Indications:    Personal history of colon polyps (screening only)   Date of last colonoscopy: 5 years, Polyps  Yes    Pre-operative Diagnosis: see indication above    Post-operative Diagnosis:  See findings below    Operator:  Wilfred CurtisSouheil  Abou-Assi, MD      Referring Provider: Donzetta MattersLARENCE G CHILDRESS, MD      Sedation:  MAC anesthesia Propofol      Procedure Details:  After informed consent was obtained with all risks and benefits of procedure explained and preoperative exam completed, the patient was taken to the endoscopy suite and placed in the left lateral decubitus position.  Upon sequential sedation as per above, a digital rectal exam was performed demonstrating internal hemorrhoids.  The Olympus videocolonoscope  was inserted in the rectum and carefully advanced to the cecum, which was identified by the ileocecal valve and appendiceal orifice.  The cecum was identified by the ileocecal valve and appendiceal orifice.  The quality of preparation was excellent.  The colonoscope was slowly withdrawn with careful evaluation between folds. Retroflexion in the rectum was completed .     Findings:   Rectum: normal  Sigmoid: normal  Descending Colon: normal  Transverse Colon: normal  Ascending Colon: normal  Cecum: normal  Terminal Ileum: normal      Specimen Removed:  none    Complications: None.     EBL:  None.    Impression:    normal colonic mucosa throughout    Recommendations: --Repeat colonoscopy in 5 years.      F/u with me in 6 to 8 weeks    Signed By: Wilfred CurtisSouheil  Abou-Assi, MD     03/20/2015  10:43 AM

## 2015-03-20 NOTE — Procedures (Signed)
Procedures  by Wilfred CurtisAbou-Assi, Destiny Trickey, MD at 03/20/15 1021                Author: Wilfred CurtisAbou-Assi, Thereasa Iannello, MD  Service: Gastroenterology  Author Type: Physician       Filed: 03/20/15 1022  Date of Service: 03/20/15 1021  Status: Signed          Editor: Wilfred CurtisAbou-Assi, Caydn Justen, MD (Physician)            Pre-procedure Diagnoses        1. Epigastric pain [R10.13]                           Post-procedure Diagnoses        1. Epigastric pain [R10.13]                           Procedures        1. UPPER GI ENDOSCOPY,BIOPSY [[id number]][[id number]]                              [facility]   [address]   [place], [place] [id number]           Esophago- Gastroduodenoscopy (EGD) Procedure Note      Noralee SpaceRavi Valadez, MD   07-Jun-1956   [id number]         Procedure: Endoscopic Gastroduodenoscopy with biopsy      Indication:  Abdominal pain, epigastric , h/o gastritis and intestinal dysplasia      Pre-operative Diagnosis: see indication above      Post-operative Diagnosis: see findings below      Operator: Wilfred CurtisSouheil  Abou-Assi, MD      Referring Provider:  Donzetta MattersLARENCE G CHILDRESS, MD         Anesthesia/Sedation:  MAC anesthesia Propofol           Procedure Details       After infomed consent was obtained for the procedure, with all risks and benefits of procedure explained the patient was taken to the endoscopy suite and placed in the left lateral decubitus position.  Following sequential administration of sedation as  per above, the endoscope was inserted into the mouth and advanced under direct vision to third portion of the duodenum.  A careful inspection was made as the gastroscope was withdrawn, including a retroflexed view of the proximal stomach; findings and  interventions are described below.        Findings:    Esophagus:normal   Stomach: normal , random biopsies taken from antrum   Duodenum/jejunum: normal, random  biopsies taken         Therapies:  none      Specimens: as above           EBL: None        Complications:   None;  patient tolerated the procedure well.             Impression:     -See post-procedure diagnoses.      Recommendations:   -colonoscopy today         Signed By:  Wilfred CurtisSouheil  Abou-Assi, MD           03/20/2015  10:21 AM

## 2015-03-20 NOTE — Progress Notes (Signed)
Initial RN admission and assessment performed and documented in Endoscopy navigator.     Patient evaluated by anesthesia in pre-procedure holding.     All procedural vital signs, airway assessment, and level of consciousness information monitored and recorded by anesthesia staff on the anesthesia record.     Report received from CRNA post procedure.  Patient transported to recovery area by RN.

## 2015-03-20 NOTE — H&P (Signed)
The patient is a 59 year old male who presents with a complaint of Abdominal Pain. The patient presents for new symptoms. The onset of the abdominal pain has been sudden and has been occurring for 1 week. The abdominal pain is described as is described as being located in the epigastrium and upper abdomen . ??The symptoms have been associated with abdominal distention, ??while the symptoms have not been associated with bloating, bloody stools, black stools, bulky stools, bone pain, chest pain, constipation, dark urine, diarrhea, dysuria, early satiety, family history of colon cancer, fever, heartburn, hematemesis, hematuria, jaundice, melena, nausea, passing worms, pica, use of alcohol, vomiting, weight loss or other. Note for "Abdominal Pain": he had colonoscopy in 2010= one polyp, egd then= gastritis with intestinal metaplasia, h/o right kidney stones, symptoms started after eating at [employer] last week, the pain is not severe, exacerbated by eating, also c/o nausea, c/o more flatulence, works as physician at [employer]      Problem List/Past Medical ([place]; 03/07/2015 10:42 AM)  Psoriasis??  Arthritis??    Past Surgical History (Valescia A Walker; 03/07/2015 10:46 AM)  Hernia Repair??  Lymph Node Biopsy??  Basal Cell Carcinoma Excision??  Hemorrhoidectomy??2004  Sinus Surgery??    Allergies Lonell Face A Walker; 03/07/2015 10:45 AM)  No Known Allergies??03/07/2015  No Known Drug Allergies??03/07/2015    Medication History (Valescia A Walker; 03/07/2015 10:47 AM)  Enbrel SureClick?? (50MG/ML Soln Auto-inj, Subcutaneous EVERY TWO WEEKS) Active.  Medications Reconciled??    Family History (Valescia A Walker; 03/07/2015 10:45 AM)  Non-Contributory Family History?? First Degree Relatives.    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  Tobacco Use?? Never smoker.  Blood Transfusion?? No.  Marital status?? Married.  Alcohol Use?? Occasional alcohol use.     Diagnostic Studies History Lonell Face A Walker; 03/07/2015 10:43 AM)  Colonoscopy??2010  Endoscopy??2010    Review of Systems (Valescia A. Walker; 03/07/2015 10:49 AM)  General Not Present- Chronic Fatigue, Poor Appetite, Weight Gain and Weight Loss.  Skin Not Present- Itching, Rash and Skin Color Changes.  HEENT Not Present- Hearing Loss and Vertigo.  Respiratory Not Present- Difficulty Breathing and TB exposure.  Cardiovascular Not Present- Chest Pain, Use of Antibiotics before Dental Procedures and Use of Blood Thinners.  Gastrointestinal Present- Abdominal Pain and Nausea. Not Present- Black, Tarry Stool, Bloody Stool, Cirrhosis, Colon Cancer, Constipation, Crohns disease, Diarrhea, Difficulty Swallowing, Dysphagia, Esophageal Cancer, Gallbladder Disease, Heartburn, Hepatitis, Hiatal Hernia, History of Previous Colonoscopy, History of Previous Endoscopy, History of Previous GI X-rays, Indigestion, Jaundice, Polyps, Rectal Bleeding, Stomach Cancer, Ulcer, Ulcerative Colitis and Vomiting.  Musculoskeletal Not Present- Arthritis, Hip Replacement Surgery and Knee Replacement Surgery.  Neurological Not Present- Weakness.  Psychiatric Not Present- Depression.  Endocrine Not Present- Diabetes and Thyroid Problems.  Hematology Not Present- Anemia.      Vitals (Valescia A. Walker; 03/07/2015 10:42 AM)  03/07/2015 10:41 AM  Weight: 157 lb???? Height: 68??in????  Body Surface Area: 1.85 m?????? Body Mass Index: 23.87 kg/m??        Physical Exam Chales Salmon MD; 03/07/2015 5:45 PM)  General  Mental Status??-??Alert.  General Appearance??-??Cooperative, Pleasant, Not in acute distress.  Orientation??-??Oriented X3.  Build & Nutrition??-??Well nourished and Well developed.    Integumentary  General Characteristics  Overall examination of the patient's skin reveals - no rashes, no bruises and no spider angiomas. Color - normal coloration of skin.    Head and Neck  Neck  Global Assessment - full range of  motion and supple, no bruit auscultated  on the right, no bruit auscultated on the left, non-tender, no lymphadenopathy.  Thyroid  Gland Characteristics - normal size and consistency.    Eye  Eyeball - Left??-??No Exophthalmos.  Eyeball - Right??-??No Exophthalmos.  Sclera/Conjunctiva - Left??-??No Jaundice.  Sclera/Conjunctiva - Right??-??No Jaundice.    Chest and Lung Exam  Chest and lung exam reveals ??-??quiet, even and easy respiratory effort with no use of accessory muscles.  Auscultation  Breath sounds - Normal. Adventitious sounds - No Adventitious sounds.    Cardiovascular  Auscultation  Rhythm - Regular, No Tachycardia, No Bradycardia . Heart Sounds - Normal heart sounds , S1 WNL and S2 WNL, No S3, No Summation Gallop. Murmurs & Other Heart Sounds - Auscultation of the heart reveals - No Murmurs.    Abdomen  Palpation/Percussion  Tenderness - Non-Tender. Rebound tenderness - No rebound. Rigidity (guarding) - No Rigidity. Dullness to percussion - No abnormal dullness to percussion. Liver - No hepatosplenomegaly. Abdominal Mass Palpable - No masses. Other Characteristics - No Ascites.  Auscultation  Auscultation of the abdomen reveals - Bowel sounds normal, No Abdominal bruits and No Succussion splash.    Rectal - Did not examine.    Peripheral Vascular  Upper Extremity  Inspection - Left - Normal - No Clubbing, No Cyanosis, No Edema, Pulses Intact. Right - Normal - No Clubbing, No Cyanosis, No Edema, Pulses Intact. Palpation - Edema - Left - No edema. Right - No edema.  Lower Extremity  Inspection - Left - Inspection Normal. Right - Inspection Normal. Palpation - Edema - Left - No edema. Right - No edema.    Neurologic  Neurologic evaluation reveals ??-??Cranial nerves grossly intact, no focal neurologic deficits.  Motor  Involuntary Movements - Asterixis - not present.    Musculoskeletal  Global Assessment  Gait and Station - normal gait and station.        Assessment & Plan Chales Salmon MD; 03/07/2015 5:47 PM)  Abdominal pain (789.00   R10.9)   Epigastric pain (789.06   R10.13)  Impression: he had colonoscopy in 2010= one polyp, egd then= gastritis with intestinal metaplasia, h/o right kidney stones, symptoms started after eating at [employer] last week, the pain is not severe, exacerbated by eating, also c/o nausea, c/o more flatulence, works as physician at [employer]  EGD to look for any ulcers, or gastritis  [place] to look for any gallstones or kidney stones  Current Plans    CBC, PLATELETS & AUT DIFF  METABOLIC PANEL, COMPREHENSIVE  LIPASE  AMYLASE  CELIAC DISEASE, COMP (14970)  Abdominal Ultrasound (26378)  [place] OF BOTH KIDNEYS (58850)  Started Bentyl 10MG, 1 (one) Capsule TID as needed for abdominal pain, #30, 10 days starting 03/07/2015, Ref. x3.  Endoscopy (27741) (Discussed risks and benefits with the patient to include: perforation, post polypectomy, or post biopsy bleeding, missed lesions, and sedation reactions.)  Pt Education - How to access health information online: discussed with patient and provided information.  Right upper quadrant pain (789.01   R10.11)  Nausea (787.02   R11.0)  Current Plans  Started Ondansetron 4MG, 1 (one) Tablet Disperse TID as needed for nausea, #20, 10 days starting 03/07/2015, Ref. x3.  Bloating symptom (787.3   R14.0)  Impression: trial of low FOADMAP diet and align  History of colon polyps (V12.72   Z86.010)  Current Plans  Discussed the risks and benefits of colonoscopy with the patient.  Started MoviPrep 100GM, 1 (one) For Solution as  directed before colonoscopy, 1 Kit, 1 day starting 03/07/2015, No Refill.  Colonoscopy [id number]) (Discussed risks and benefits with the patient to include:; perforation, post polypectomy, or post biopsy bleeding, missed lesions, and sedation reactions.)  Date of Surgery Update:  Tama High, MD was seen and examined.  History and physical has been reviewed. The patient has been examined. There have been no significant clinical changes since the completion of  the originally dated History and Physical.    Signed By: Corinna Lines, MD     Mar 20, 2015 9:55 AM

## 2015-05-14 NOTE — Telephone Encounter (Signed)
Dr.Pile called to check the status of his Biometric Paperwork.I informed him that Dr.Childress is going to complete it he said just fax it to the fax number on the form then it will directly [facility] to [facility].Friberg's # [phone].

## 2015-05-15 NOTE — Telephone Encounter (Signed)
Form signed/reviewed to fax.    MyChart message to pt informing would be faxed today.

## 2015-05-15 NOTE — Telephone Encounter (Signed)
Form faxed. Pt notified. Form to be scanned in house.

## 2015-06-11 ENCOUNTER — Telehealth

## 2015-06-11 NOTE — Telephone Encounter (Signed)
Per Dr Theda Belfast, forward the message to him and he will call pt directly.

## 2015-06-11 NOTE — Telephone Encounter (Signed)
Patient called to make sure Dr.Childress received his message.

## 2015-06-11 NOTE — Telephone Encounter (Addendum)
Spoke with pt.    He notes for past week, discomfort/paresthesia initially around umbilicus, and slightly worse on one side.    He is a physician, and cares for pregnant moms/as perinatolgist.    He is concerned about possible zoster given character of pain, even though he has not notes rash or skin change over course of the past week.    Reviewed and agree with trial ValACV pending eval in clinic.  He prefers/agrees to this, and prefers dosing for infection control and early treatment prior to rash onset.    Agrees to be seen tomorrow afternoon or early Wed AM (8/9 or 8/10) for evaluation symptoms/exam.    He will call tomorrow to schedule appt in clinic.    Receipt electronically verified by pharmacy.  Requested Prescriptions     Signed Prescriptions Disp Refills   ??? valACYclovir (VALTREX) 1 gram tablet 42 Tab 0     Sig: Take 1 Tab by mouth three (3) times daily for 14 days.     Authorizing Provider: Donzetta MattersHILDRESS, Tomasa Dobransky G

## 2015-06-11 NOTE — Telephone Encounter (Signed)
Pt called requesting a call back from Dr. Theda Belfast regarding symptoms that he is having. He didn't want to leave and specific information. [phone]

## 2015-06-12 ENCOUNTER — Ambulatory Visit
Admit: 2015-06-12 | Discharge: 2015-06-12 | Payer: PRIVATE HEALTH INSURANCE | Attending: Internal Medicine | Primary: Internal Medicine

## 2015-06-12 DIAGNOSIS — M792 Neuralgia and neuritis, unspecified: Secondary | ICD-10-CM

## 2015-06-12 MED ORDER — VALACYCLOVIR 1 G TAB
1 gram | ORAL_TABLET | Freq: Three times a day (TID) | ORAL | Status: AC
Start: 2015-06-12 — End: 2015-06-25

## 2015-06-12 MED ORDER — GABAPENTIN 100 MG CAP
100 mg | ORAL_CAPSULE | Freq: Three times a day (TID) | ORAL | Status: DC | PRN
Start: 2015-06-12 — End: 2015-06-20

## 2015-06-12 NOTE — Progress Notes (Signed)
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    HPI  Here for evaluation of dermatomal pain in bilat lower thoracic area.    Notes pain in band-like pattern from back to anterior abdomen for past week.  He reviewed with his wife (she is an adult infectious disease specialist) and had reviewed with me by phone yesterday.    He notes pain is manageable but intense at times.  Not relieved by some PRN current OTC measures.    When reviewed prior by phone/yesterday, empirically started [facility] at zoster dosing as trial to see if would improve.  Reviewed with pt at visit today.    He filled and started meds last PM.  Plan reviewed with pt--would expect improvement in 3-5 days with medication but would continue for 7 days at least for full trial medication.  Reviewed if not improved by 7 days would not expect improvement, and would seek alternative dx if not at that time with more clear findings consistent with zoster.    Had reviewed with pt that due to immunocompromised status, atypical presentation could be likely.    He notes pharmacy only covered 10 of 14 days prescribed.  Reviewed duration and dosing with pt at visit.    He also had questions about indications for and dosing of pneumococcal vaccines.  Notes has not had either prior.  Reviewed dosing and schedule as per instructions with pt.  Reviewed available here if needed, as well as in pharmacy.    No problems noted with current ValACV dosing--too early to determine if improvement.    Reviewed neuro eval if not improving on therapy.    Reviewed use if needed of gabapentin.  Reviewed dosing and titration with pt at visit.      ROS      Blood pressure 108/73, pulse 80, temperature 98.1 ??F (36.7 ??C), temperature source Oral, resp. rate 16, height 5' 8.5" (1.74 m), weight 159 lb 12.8 oz (72.5 kg), SpO2 96 %.    Physical Exam   Constitutional: He appears well-developed and well-nourished. No distress.   HENT:   Head: Normocephalic and atraumatic.    Eyes: Conjunctivae are normal. Right eye exhibits no discharge. Left eye exhibits no discharge. No scleral icterus.   Neck: Normal range of motion. Neck supple.   Cardiovascular: Normal rate, regular rhythm, normal heart sounds and intact distal pulses.  Exam reveals no gallop and no friction rub.    No murmur heard.  Pulmonary/Chest: Effort normal and breath sounds normal. No respiratory distress. He has no wheezes. He has no rales.   Abdominal: Soft. Bowel sounds are normal. He exhibits no distension. There is no tenderness.   Musculoskeletal: He exhibits no edema or tenderness.        Arms:  Neurological: He is alert. He exhibits normal muscle tone. Coordination normal.   Motor 5/5 LE's and UE's bilat.  SILT bilat LE's and UE's.   Skin: Skin is warm. He is not diaphoretic. No erythema. No pallor.   Psychiatric: He has a normal mood and affect. His behavior is normal. Judgment and thought content normal.   Reviewed skin findings with pt at visit.      ASSESSMENT and PLAN    ICD-10-CM ICD-9-CM    1. 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 Immunization counseling Z71.89 V65.49        1.  Given dermatomal (bilateral T10/umbilical) continue empiric trial ValACV.  Use of gabapentin and titration reviewed with pt at visit.  SE's reviewed at visit.    Plan Neuro eval if needed/not improving.  Pt would prefer probably [facility] location if needed.    2.  Reviewed Pneumococcal vaccination schedules and coverage with pt at visit.      Follow-up Disposition:  Return if symptoms worsen or fail to improve.  reviewed medications and side effects in detail    For additional documentation of information and/or recommendations discussed this visit, please see notes in instructions.

## 2015-06-12 NOTE — Progress Notes (Signed)
Rm #15    Chief Complaint   Patient presents with   ??? Abdominal Pain     pt states "burning" feeling on lower abdomen     Patient is not fasting.    Patient states no advance directive. Information given with AVS.    1. Have you been to the ER, urgent care clinic since your last visit?  Hospitalized since your last visit?No    2. Have you seen or consulted any other health care providers outside of the [facility] since your last visit?  Include any pap smears or colon screening. No

## 2015-06-12 NOTE — Patient Instructions (Addendum)
Check with pharmacy about your pneumococcal vaccines.    Get the Prevnar-13 first, followed in 8wks by the Pneumovax (PPSV)-23.  This is the best order to optimize your vaccinations for this.    If covered in clinic can get here, but pharmacy can check/confirm coverage and give there also.    If not improving on Valtrex, would see neurology to evaluate.      Advance Directives: Care Instructions  Your Care Instructions  An advance directive is a legal way to state your wishes at the end of your life. It tells your family and your doctor what to do if you can no longer say what you want.  There are two main types of advance directives. You can change them any time that your wishes change.  ?? A living will tells your family and your doctor your wishes about life support and other treatment.  ?? A medical power of attorney lets you name a person to make treatment decisions for you when you can't speak for yourself. This person is called a health care agent.  If you do not have an advance directive, decisions about your medical care may be made by a doctor or a judge who doesn't know you.  It may help to think of an advance directive as a gift to the people who care for you. If you have one, they won't have to make tough decisions by themselves.  Follow-up care is a key part of your treatment and safety. Be sure to make and go to all appointments, and call your doctor if you are having problems. It's also a good idea to know your test results and keep a list of the medicines you take.  How can you care for yourself at home?  ?? Discuss your wishes with your loved ones and your doctor. This way, there are no surprises.  ?? Many states have a unique form. Or you might use a universal form that has been approved by many states. This kind of form can sometimes be completed and stored online. Your electronic copy will then be available wherever you have a connection to the Internet. In most cases, doctors  will respect your wishes even if you have a form from a different state.  ?? You don't need a lawyer to do an advance directive. But you may want to get legal advice.  ?? Think about these questions when you prepare an advance directive:  ?? Who do you want to make decisions about your medical care if you are not able to? Many people choose a family member, close friend, or doctor.  ?? Do you know enough about life support methods that might be used? If not, talk to your doctor so you understand.  ?? What are you most afraid of that might happen? You might be afraid of having pain, losing your independence, or being kept alive by machines.  ?? Where would you prefer to die? Choices include your home, a hospital, or a nursing home.  ?? Would you like to have information about hospice care to support you and your family?  ?? Do you want to donate organs when you die?  ?? Do you want certain religious practices performed before you die? If so, put your wishes in the advance directive.  ?? Read your advance directive every year, and make changes as needed.  When should you call for help?  Be sure to contact your doctor if you have any questions.  Where  can you learn more?  Go to InsuranceStats.cahttp://www.healthwise.net/GoodHelpConnections  Enter R264 in the search box to learn more about "Advance Directives: Care Instructions."  ?? 2006-2016 [employer]. Care instructions adapted under license by [employer] (which disclaims liability or warranty for this information). This care instruction is for use with your licensed healthcare professional. If you have questions about a medical condition or this instruction, always ask your healthcare professional. Healthwise, Incorporated disclaims any warranty or liability for your use of this information.  Content Version: 10.9.538570; Current as of: December 27, 2014

## 2015-06-20 ENCOUNTER — Encounter

## 2015-06-20 ENCOUNTER — Ambulatory Visit
Admit: 2015-06-20 | Discharge: 2015-06-20 | Payer: PRIVATE HEALTH INSURANCE | Attending: Neurology | Primary: Internal Medicine

## 2015-06-20 DIAGNOSIS — M792 Neuralgia and neuritis, unspecified: Secondary | ICD-10-CM

## 2015-06-20 MED ORDER — GABAPENTIN 300 MG CAP
300 mg | ORAL_CAPSULE | Freq: Three times a day (TID) | ORAL | 3 refills | Status: DC
Start: 2015-06-20 — End: 2018-01-01

## 2015-06-20 MED ORDER — LIDOCAINE 5 % (700 MG/PATCH) ADHESIVE PATCH
5 % | CUTANEOUS | 2 refills | Status: DC
Start: 2015-06-20 — End: 2015-06-21

## 2015-06-20 NOTE — Progress Notes (Signed)
Patient here for C/O having pain to sides, extremities while walking.

## 2015-06-20 NOTE — Progress Notes (Signed)
Chief Complaint   Patient presents with   ??? Neurologic Problem         HISTORY OF PRESENT ILLNESS  Anthony Space, Anthony Short is a 59 y.o. 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 For the past 2 weeks he has been experiencing pain in a bandlike fashion starting in the lower back and extending forwards in to the umbilicus. It is 2-3 cm wide strip which is extremely sensitive to touch and is painful. He started taking gabapentin which has helped some but allodynia persists. There are no motor symptoms in the lower extremities. No bladder or bowel control issues. No back pain. Denies ever having any lesions or rashes.   He is on etanercept for psoriatic arthritis. No symptoms in the upper extremities.     Past Medical History   Diagnosis Date   ??? Arthritis    ??? 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  Last seizure 1992-1993.   ??? Psoriatic arthritis (HCC)      Dr. Towanda Malkin   ??? Right lateral epicondylitis      Current Outpatient Prescriptions   Medication Sig   ??? gabapentin (NEURONTIN) 300 mg capsule Take 1 Cap by mouth three (3) times daily.   ??? lidocaine (LIDODERM) 5 % 1 Patch by TransDERmal route every twenty-four (24) hours. Apply patch to the affected area for 12 hours a day and remove for 12 hours a day.   ??? valACYclovir (VALTREX) 1 gram tablet Take 1 Tab by mouth three (3) times daily for 14 days.   ??? fluticasone (FLONASE) 50 mcg/actuation nasal spray as needed.   ??? loratadine (CLARITIN) 10 mg tablet Take 10 mg by mouth daily.   ??? etanercept (ENBREL) 50 mg/mL (0.98 mL) injection 50 mg by SubCUTAneous route every seven (7) days.     No current facility-administered medications for this visit.      No Known Allergies  Family History   Problem Relation Age of Onset   ??? Heart Disease Father      Social History   Substance Use Topics   ??? Smoking status: Never Smoker   ??? Smokeless tobacco: Never Used   ??? Alcohol use 0.0 oz/week     0 Standard drinks or equivalent per week       Comment: Social     Past Surgical History   Procedure Laterality Date   ??? Hx hernia repair     ??? Hx thoracotomy     ??? Colonoscopy N/A 03/20/2015     COLONOSCOPY performed by Wilfred Curtis, Anthony Short at [facility] ENDOSCOPY         REVIEW OF SYSTEMS  Review of Systems - History obtained from the patient  Psychological ROS: negative  ENT ROS: negative  Hematological and Lymphatic ROS: negative  Endocrine ROS: negative  Respiratory ROS: no cough, shortness of breath, or wheezing  Cardiovascular ROS: no chest pain or dyspnea on exertion  Gastrointestinal ROS: no abdominal pain, change in bowel habits, or black or bloody stools  Genito-Urinary ROS: no dysuria, trouble voiding, or hematuria  Musculoskeletal ROS: negative  Dermatological ROS: negative      PHYSICAL EXAMINATION:    Visit Vitals   ??? BP 110/70 (BP 1 Location: Right arm, BP Patient Position: Sitting)   ??? Pulse 68   ??? Temp 97.4 ??F (36.3 ??C)   ??? Resp 18   ??? Ht 5' 8.5" (1.74 m)   ??? Wt 71.2 kg (157  lb)   ??? SpO2 98%   ??? BMI 23.52 kg/m2     General:  Well defined, nourished, and groomed individual in no acute distress.    Neck: Supple, nontender, thyroid within normal limits, no JVD, no bruits, no pain with resistance to active range of motion.    Heart: Regular rate and rhythm, no murmurs, rub, or gallop.  Normal S1S2.  Lungs:  Clear to auscultation bilaterally with equal chest expansion, no cough, no wheeze  Musculoskeletal:  Extremities revealed no edema and had full range of motion of joints.    Psych:  Good mood and bright affect    NEUROLOGICAL EXAMINATION:     Mental Status:   Alert and oriented to person, place, and time with recent and remote memory intact.  Attention span and concentration are normal. Speech is fluent with a full fund of knowledge.      Cranial Nerves:    II, III, IV, VI:  Visual acuity grossly intact. Visual fields are normal.    Pupils are equal, round, and reactive to light and accommodation.     Extra-ocular movements are full and fluid.  Fundoscopic exam was benign, no ptosis or nystagmus.   V-XII: Hearing is grossly intact.  Facial features are symmetric, with normal sensation and strength.  The palate rises symmetrically and the tongue protrudes midline.  Sternocleidomastoids 5/5.      Motor Examination: Normal tone, bulk, and strength. 5/5 muscle strength throughout.  No cogwheel rigidity or clonus present.      Sensory exam:  Normal throughout to pinprick, temperature, and vibration sense.  Normal proprioception.  Allodynia in the T10 dermatome bilaterally.     Coordination:  Heel-to-shin was smooth and symmetrical bilaterally.  Finger to nose and rapid arm movement testing was normal.   No resting or intention tremor    Gait and Station:  Steady while walking on toes, heels, and with tandem walking.  Normal arm swing.  No Rhomberg or pronator drift.   No muscle wasting or fasiculations noted.      Reflexes:  DTRs 2+ throughout.  Toes downgoing.      ASSESSMENT    ICD-10-CM ICD-9-CM    1. 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 Carlis Blanchard, Anthony Short has sensory disturbance/allodynia in the T10 dermatome bilaterally. Potential causes of such symptoms were discussed. Herpetic neuralgia is atypical in a bilateral distribution and without any cutaneous lesions. I have recommended MRI scan of the thoracic spine with and without contrast to rule out any focal structural lesion.   We'll continue to treat him symptomatically with increased dose of gabapentin and lidocaine topically.   Follow up after the imaging is completed.       Lance Bosch, Anthony Short  Diplomate, American Board of Psychiatry & Neurology (Neurology)  Diplomate, American Board of Psychiatry & Neurology (Clinical Neurophysiology)  Diplomate, American Board of Electrodiagnostic Medicine

## 2015-06-20 NOTE — Patient Instructions (Signed)
PRESCRIPTION REFILL POLICY    [facility]   Statement to Patients  February 01, 2013      In an effort to ensure the large volume of patient prescription refills is processed in the most efficient and expeditious manner, we are asking our patients to assist us by calling your Pharmacy for all prescription refills, this will include also your  Mail Order Pharmacy. The pharmacy will contact our office electronically to continue the refill process.    Please do not wait until the last minute to call your pharmacy. We need at least 48 hours (2days) to fill prescriptions. We also encourage you to call your pharmacy before going to pick up your prescription to make sure it is ready.     With regard to controlled substance prescription refill requests (narcotic refills) that need to be picked up at our office, we ask your cooperation by providing us with at least 72 hours (3days) notice that you will need a refill.    We will not refill narcotic prescription refill requests after 4:00pm on any weekday, Monday through Thursday, or after 2:00pm on Fridays, or on the weekends.      We encourage everyone to explore another way of getting your prescription refill request processed using MyChart, our patient web portal through our electronic medical record system. MyChart is an efficient and effective way to communicate your medication request directly to the office and  downloadable as an app on your smart phone . MyChart also features a review functionality that allows you to view your medication list as well as leave messages for your physician. Are you ready to get connected? If so please review the attatched instructions or speak to any of our staff to get you set up right away!    Thank you so much for your cooperation. Should you have any questions please contact our Practice Administrator.    The Physicians and Staff,  [facility]

## 2015-06-20 NOTE — Telephone Encounter (Signed)
Would like a call back from Dr. Lauree Chandler would like information on blood test.

## 2015-06-21 ENCOUNTER — Encounter

## 2015-06-21 ENCOUNTER — Inpatient Hospital Stay: Admit: 2015-06-21 | Payer: PRIVATE HEALTH INSURANCE | Primary: Internal Medicine

## 2015-06-21 DIAGNOSIS — M792 Neuralgia and neuritis, unspecified: Secondary | ICD-10-CM

## 2015-06-21 LAB — VZV AB, IGG: VARICELLA ZOSTER IGG: 1382 index (ref >165)

## 2015-06-21 LAB — VZV AB, IGM: V. zoster Ab, IgM: <0.91 index (ref 0.00–0.90)

## 2015-06-21 MED ORDER — GADOBUTROL 7.5 MMOL/7.5 ML (1 MMOL/ML) IV
7.5 mmol/ mL (1 mmol/mL) | Freq: Once | INTRAVENOUS | Status: AC
Start: 2015-06-21 — End: 2015-06-21
  Administered 2015-06-21: 15:00:00 via INTRAVENOUS

## 2015-06-21 MED FILL — GADAVIST 7.5 MMOL/7.5 ML (1 MMOL/ML) INTRAVENOUS SOLUTION: 7.5 mmol/ mL (1 mmol/mL) | INTRAVENOUS | Qty: 7.5

## 2015-06-21 NOTE — Telephone Encounter (Signed)
Pt was into see Dr.Childress on 06/12/15 the pt has some additional question's from that visit and would like to have Dr.Childress call him to discuss.Pt's # [phone].Please call and advise.

## 2015-06-21 NOTE — Telephone Encounter (Signed)
Pt would like to speak to Dr. Lauree ChandlerSangha about pain and also his MRI. Please give him a call.

## 2015-06-22 ENCOUNTER — Encounter

## 2015-06-22 MED ORDER — LIDOCAINE 5 % (700 MG/PATCH) ADHESIVE PATCH
5 % | CUTANEOUS | 2 refills | Status: DC
Start: 2015-06-22 — End: 2018-01-01

## 2015-06-22 NOTE — Telephone Encounter (Signed)
Pt calling re medication. Patch needs pre Berkley Harvey and has questions about another medication. Please call back he needs a call back before 4:30.

## 2015-06-25 LAB — LYME AB, IGM, WITH REFLEX WBLOT: Lyme Disease Ab, IgM, QT: <0.8 index (ref 0.00–0.79)

## 2015-06-25 NOTE — Telephone Encounter (Signed)
Spoke with patient reports Pharmacy will need Prior Authorization for Lidocaine 5% patch order.

## 2015-06-26 LAB — CBC WITH AUTOMATED DIFF
ABS. BASOPHILS: 0.1 10*3/uL (ref 0.0–0.2)
ABS. EOSINOPHILS: 0.4 10*3/uL (ref 0.0–0.4)
ABS. IMM. GRANS.: 0 10*3/uL (ref 0.0–0.1)
ABS. MONOCYTES: 0.4 10*3/uL (ref 0.1–0.9)
ABS. NEUTROPHILS: 4.3 10*3/uL (ref 1.4–7.0)
Abs Lymphocytes: 1.8 10*3/uL (ref 0.7–3.1)
BASOPHILS: 1 %
EOSINOPHILS: 5 %
HCT: 41.7 % (ref 37.5–51.0)
HGB: 14.5 g/dL (ref 12.6–17.7)
IMMATURE GRANULOCYTES: 0 %
Lymphocytes: 26 %
MCH: 30.9 pg (ref 26.6–33.0)
MCHC: 34.8 g/dL (ref 31.5–35.7)
MCV: 89 fL (ref 79–97)
MONOCYTES: 6 %
NEUTROPHILS: 62 %
PLATELET: 283 10*3/uL (ref 150–379)
RBC: 4.69 x10E6/uL (ref 4.14–5.80)
RDW: 13.9 % (ref 12.3–15.4)
WBC: 7 10*3/uL (ref 3.4–10.8)

## 2015-06-26 LAB — METABOLIC PANEL, COMPREHENSIVE
A-G Ratio: 1.5 (ref 1.1–2.5)
ALT (SGPT): 16 IU/L (ref 0–44)
AST (SGOT): 14 IU/L (ref 0–40)
Albumin: 4.3 g/dL (ref 3.5–5.5)
Alk. phosphatase: 70 IU/L (ref 39–117)
BUN/Creatinine ratio: 11 (ref 9–20)
BUN: 11 mg/dL (ref 6–24)
Bilirubin, total: 0.4 mg/dL (ref 0.0–1.2)
CO2: 25 mmol/L (ref 18–29)
Calcium: 9.2 mg/dL (ref 8.7–10.2)
Chloride: 104 mmol/L (ref 97–108)
Creatinine: 1 mg/dL (ref 0.76–1.27)
GFR est AA: 95 mL/min/{1.73_m2} (ref >59)
GFR est non-AA: 83 mL/min/{1.73_m2} (ref >59)
GLOBULIN, TOTAL: 2.9 g/dL (ref 1.5–4.5)
Glucose: 96 mg/dL (ref 65–99)
Potassium: 4.5 mmol/L (ref 3.5–5.2)
Protein, total: 7.2 g/dL (ref 6.0–8.5)
Sodium: 143 mmol/L (ref 134–144)

## 2015-06-26 LAB — LYME AB, IGG & IGM BY WB
IgG P18 Ab: ABSENT
IgG P23 Ab: ABSENT
IgG P28 Ab: ABSENT
IgG P30 Ab: ABSENT
IgG P39 Ab: ABSENT
IgG P45 Ab: ABSENT
IgG P58 Ab: ABSENT
IgG P66 Ab: ABSENT
IgG P93 Ab: ABSENT
IgM P23 Ab: ABSENT
IgM P39 Ab: ABSENT
IgM P41 Ab: ABSENT
Lyme Ab, IgG WB Interp.: NEGATIVE
Lyme Ab, IgM WB Interp.: NEGATIVE

## 2015-06-26 NOTE — Telephone Encounter (Signed)
Faxed Lidocaine PA request via CMM w/ clinicals to [facility]

## 2015-06-28 LAB — VARICELLA ZOSTER BY PCR: VZV by PCR: NEGATIVE

## 2015-07-03 MED ORDER — VALACYCLOVIR 1 G TAB
1 gram | ORAL_TABLET | Freq: Every day | ORAL | 0 refills | Status: DC
Start: 2015-07-03 — End: 2018-01-01

## 2016-02-19 ENCOUNTER — Ambulatory Visit
Admit: 2016-02-19 | Discharge: 2016-02-19 | Payer: PRIVATE HEALTH INSURANCE | Attending: Rheumatology | Primary: Internal Medicine

## 2016-02-19 DIAGNOSIS — L4050 Arthropathic psoriasis, unspecified: Secondary | ICD-10-CM

## 2016-02-19 NOTE — Progress Notes (Signed)
CHIEF COMPLAINT  The patient was sent for rheumatology consultation by Dr. Evette Doffing, MD for evaluation of joint pain.    HISTORY OF PRESENT ILLNESS  This is a 60 y.o. [place] male.  Today, the patient complains of pain in the joints.  Location: NA  Severity:  0 on a scale of 0-10  Timing: all day   Duration:  many years  Modifying factors: Enbrel  Context/Associated signs and symptoms: The patient states that he was diagnosed with psoriasis in 1998. At the same time he had wrist pain. He was later diagnosed with psoriatic arthritis and was started on methotrexate in 2002. This helped with his joint symptoms, but his psoriasis did not change. His arthralgia worsened, so he started on Enbrel in July 2007. This provided significant relief and completely resolved his joint symptoms. Enbrel did not treat his psoriasis. He states that when he started Enbrel he had minimal psoriasis, but now his rash is much worse. He began spacing his Enbrel and when he spaced it to every 5 weeks his joint symptoms returned. He believes that Enbrel every 2-3 weeks if sufficient to treat his arthritis. He increased his Enbrel back to weekly, with no additional improvement. He considered switching to Humira, but has reservations about its possible adverse effects.     Today, he denies any joint swelling, joint pain, or morning stiffness. He admits to right sided plantar pain. He denies dactylitis or lower back pain, but he admits to having back pain when he was a child and states that he had been diagnosed with Ankylosing Spondylitis at the time. He has reported that his psoriasis has been worsening, especially on his shins. He states that his rash is only itchy over his shins. He admits to using some topicals to control his itch.     RHEUMATOLOGY REVIEW OF SYSTEMS   Positives as per HPI  Negatives as follows:  CONSTITUTlONAL:  Denies unexplained persistent fevers, weight change, chronic fatigue   HEAD/EYES:   Denies eye redness, blurry vision or sudden loss of vision, dry eyes, HA, temporal artery pain  ENT:    Denies oral/nasal ulcers, recurrent sinus infections, dry mouth  RESPIRATORY:  No pleuritic pain, history of pleural effusions, hemoptysis, exertional dyspnea  CARDIOVASCULAR:  Denies chest pain, history of pericardial effusions  GASTRO:   Denies heartburn, esophageal dysmotility, abdominal pain, nausea, vomiting, diarrhea, blood in the stool  HEMATOLOGIC:  No easy bruising, purpura, swollen lymph nodes  SKIN:    Denies alopecia, ulcers, nodules, sun sensitivity  VASCULAR:   Denies edema, cyanosis, raynaud phenomenon  NEUROLOGIC:  Denies specific muscle weakness, paresthesias   PSYCHIATRIC:  No sleep disturbance / snoring, depression, anxiety  MSK:    No morning stiffness >1 hour, SI joint pain, persistent joint swelling, persistent joint pain    MEDICAL AND SOCIAL HISTORY  This was reviewed with the patient and reviewed in the medical records.      Past Medical History:   Diagnosis Date   ??? Arthritis    ??? 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  Last seizure 1992-1993.   [place] CLenna Sci[place]  ??? Right lateral epicondylitis      Past Surgical History:   Procedure Laterality Date   ??? COLONOSCOPY N/A 03/20/2015    [facility]   ??? HX HERNIA REPAIR     ??? HX  THORACOTOMY       Social History   Substance Use Topics   ??? Smoking status: Never Smoker   ??? Smokeless tobacco: Never Used   ??? Alcohol use 0.0 oz/week     0 Standard drinks or equivalent per week      Comment: Social     Employment - Yes  Sleep - Good, but snores  Exercise - no    FAMILY HISTORY  psoriasis - Uncle  MEDICATIONS  All the current medications were reviewed in detail.      PHYSICAL EXAM  Blood pressure 124/85, pulse 68, temperature 96.3 ??F (35.7 ??C),  temperature source Oral, resp. rate 18, height 5' 8.5" (1.74 m), weight 160 lb 12.8 oz (72.9 kg), SpO2 98 %.  GENERAL APPEARANCE: Well-nourished adult in no acute distress.  EYES: No scleral erythema, conjunctival injection.  ENT: No oral ulcer, parotid enlargement.  NECK: No adenopathy, thyroid enlargement.  CARDIOVASCULAR: Heart rhythm is regular. No murmur, rub, gallop.  CHEST: Normal vesicular breath sounds. No wheezes, rales, pleural friction rubs.  ABDOMINAL: The abdomen is soft and nontender. Liver and spleen are nonpalpable. Bowel sounds are normal.  EXTREMITIES: There is no evidence of clubbing, cyanosis, edema.  SKIN: No palpable purpura, digital ulcer, abnormal thickening. Psoriatic plaques over bilateral elbow, shins, scalp, lower back, face covering ~10% BSA.   NEUROLOGICAL: Normal gait and station, full strength in upper and lower extremities, normal sensation to light touch.  MUSCULOSKELETAL:   Upper extremities - full range of motion, no tenderness, no swelling, no synovial thickening and no deformity of joints.  Lower extremities - full range of motion, no tenderness, no swelling, no synovial thickening and no deformity of joints.      LABS, RADIOLOGY AND PROCEDURES  Previous labs reviewed -Yes  Previous radiology reviewed -Yes  Previous procedures reviewed -Yes  Previous medical records reviewed/summarized -Yes    ASSESSMENT1. Psoriatic arthritis - history of psoriasis, arthralgia, arthritis, and achilles enthesitis since 1998, diagnosed with PsA in 2002. Previously treated with methotrexate and Enbrel. Arthritis well controlled on Enbrel. (New problem - Progressive disease) - The patient has a history of PsA with arthritis, arthralgia, achilles enthesitis, and psoriasis. He has been treated with methotrexate and Enbrel, with Enbrel providing great relief in his joint symptoms. However, his psoriasis has not responded to Enbrel or methotrexate, and it is worsening. We discussed his greater risk  of heart disease because of his persistent inflammation from his psorisis. We have also discussed switching his Enbrel to either Humira or Cosentyx. He prefers to switch to Humira, so I will get him approved for this. If Humira is ineffective in treating his psoriasis, then I will switch him to Cosentyx. He should return in 2 months for a follow up.   2. New medication - Anti-TNF therapy (humira) - A written summary, as prepared by the [employer] of Rheumatology was provided.  The patient was given the opportunity to ask questions, and verbalized understanding of the following: The most common side effect seen with the injectable drugs (adalimumab) are skin reactions, commonly referred to as "injection site reactions."  This can last up to 1 week.  The most significant side effects of anti-TNF therapy are increased risk for all types of infections, including TB and fungal infections. Some of these infections may be severe.  The patient will be tested for TB and hepatitis prior to starting therapy.  The medication should be stopped if there is high fever or if the patient  is being treated with antibiotics for an infection.  Long term anti-TNF agents may increase the risk of cancers such as lymphoma and skin cancer.  There are rare neurologic complications from the use of these medications. People who have a history of multiple sclerosis should not use them. People with significant heart failure should not be on anti-TNF therapy.  Using these medications may make the vaccination less effective and live vaccines should not be given.  PLAN  1. Switch Enbrel to Humira   2. Rule out hepatitis and tuberculosis in anticipation of possible anti-TNF therapy  3. Check CBC, CMP, markers of inflammation (ESR, CRP)  4. Return in 2 months    Maurico Perrell M. Posey Pronto, MD  Adult and Pediatric Rheumatology     [facility] [place], [place] [address], Phone [phone], Fax  [phone]     Visiting Assistant Professor of Pediatrics    Department of Pediatrics, [facility] [place], [place] [address], Phone [phone], Fax [phone]    There are no Patient Instructions on file for this visit.    cc:  Evette Doffing, MD    Written by Shelbie Proctor, scribe, as dictated by Mikal Plane. Posey Pronto, M.D.

## 2016-02-20 LAB — HEPATITIS B CORE ANTIBODY, TOTAL: Hep B Core Total Ab: NEGATIVE

## 2016-02-20 LAB — HCV AB W/RFLX TO NAA
HCV AB, 144035: <0.1 s/co ratio (ref 0.0–0.9)
HCV Ab: <0.1 s/co ratio (ref 0.0–0.9)

## 2016-02-20 LAB — HEPATITIS B SURF AB QUANT
HEPATITIS B SURF AB QUANT, 006531: 55.7 m[IU]/mL (ref >9.9)
Hepatitis B surf Ab, QT: 55.7 m[IU]/mL (ref >9.9)

## 2016-02-20 LAB — HEP B SURFACE AG: Hep B surface Ag screen: NEGATIVE

## 2016-02-20 LAB — HEPATITIS B CORE AB, TOTAL: Hep B Core Ab, total: NEGATIVE

## 2016-02-20 LAB — SED RATE (ESR): Sed rate (ESR): 7 mm/hr (ref 0–30)

## 2016-02-20 LAB — INTERPRETATION

## 2016-02-21 LAB — METABOLIC PANEL, COMPREHENSIVE
A-G Ratio: 1.7 (ref 1.2–2.2)
ALT (SGPT): 18 IU/L (ref 0–44)
AST (SGOT): 19 IU/L (ref 0–40)
Albumin: 4.4 g/dL (ref 3.5–5.5)
Alk. phosphatase: 66 IU/L (ref 39–117)
BUN/Creatinine ratio: 15 (ref 9–20)
BUN: 12 mg/dL (ref 6–24)
Bilirubin, total: 0.3 mg/dL (ref 0.0–1.2)
CO2: 24 mmol/L (ref 18–29)
Calcium: 9.1 mg/dL (ref 8.7–10.2)
Chloride: 101 mmol/L (ref 96–106)
Creatinine: 0.82 mg/dL (ref 0.76–1.27)
GFR est AA: 112 mL/min/{1.73_m2} (ref >59)
GFR est non-AA: 97 mL/min/{1.73_m2} (ref >59)
GLOBULIN, TOTAL: 2.6 g/dL (ref 1.5–4.5)
Glucose: 87 mg/dL (ref 65–99)
Potassium: 4.3 mmol/L (ref 3.5–5.2)
Protein, total: 7 g/dL (ref 6.0–8.5)
Sodium: 142 mmol/L (ref 134–144)

## 2016-02-21 LAB — CBC+PLATELET+HEM REVIEW
ABS. BASOPHILS: 0.1 10*3/uL (ref 0.0–0.2)
ABS. EOSINOPHILS: 0.3 10*3/uL (ref 0.0–0.4)
ABS. LYMPHOCYTES: 1.9 10*3/uL (ref 0.7–3.1)
ABS. MONOCYTES: 0.5 10*3/uL (ref 0.1–0.9)
ABS. NEUTROPHILS: 3.2 10*3/uL (ref 1.4–7.0)
BASOPHILS: 1 %
EOSINOPHILS: 5 %
HCT: 42.6 % (ref 37.5–51.0)
HGB: 14.2 g/dL (ref 12.6–17.7)
LYMPHOCYTES: 32 %
MCH: 30 pg (ref 26.6–33.0)
MCHC: 33.3 g/dL (ref 31.5–35.7)
MCV: 90 fL (ref 79–97)
MONOCYTES: 8 %
NEUTROPHILS: 54 %
PLATELET COMMENT: ADEQUATE
PLATELET: 276 10*3/uL (ref 150–379)
RBC COMMENT: NORMAL
RBC: 4.74 x10E6/uL (ref 4.14–5.80)
RDW: 13.7 % (ref 12.3–15.4)
WBC: 5.9 10*3/uL (ref 3.4–10.8)

## 2016-02-21 LAB — C REACTIVE PROTEIN, QT: C-Reactive Protein, Qt: 2 mg/L (ref 0.0–4.9)

## 2016-02-22 LAB — QUANTIFERON IN TUBE REFL
QFT TB Ag minus Nil Value: 0 IU/mL
QuantiFERON Mitogen Value: 7.26 IU/mL
QuantiFERON Nil Value: 0.02 IU/mL
QuantiFERON TB Ag Value: 0.02 IU/mL
QuantiFERON TB Gold: NEGATIVE

## 2016-02-22 LAB — QUANTIFERON TB GOLD

## 2016-02-26 MED ORDER — ADALIMUMAB 40 MG/0.8 ML SUBCUTANEOUS PEN KIT
40 mg/0.8 mL | PACK | SUBCUTANEOUS | 3 refills | Status: AC
Start: 2016-02-26 — End: 2016-03-27

## 2016-02-28 NOTE — Telephone Encounter (Signed)
-----   Message from Cleon GustinJo A Newington sent at 02/28/2016  8:35 AM EDT -----  Regarding: FW: Dr Patel/refill      ----- Message -----     From: Collene Mareseresa A Abernathy     Sent: 02/27/2016   5:05 PM       To: [facility] Front Office Pool  Subject: Dr Carolanne GrumblingPatel/refill                                  Lauren with [facility] need diagnosis code for reason why pt is needing "Humira". Best contact number [phone].

## 2016-02-28 NOTE — Telephone Encounter (Signed)
Called and spoke with Anthony Short - pharmacist with [facility] provided him with the diagnosis code of L40.50 PSA to process the Humira request.

## 2016-04-08 ENCOUNTER — Inpatient Hospital Stay
Admit: 2016-04-08 | Discharge: 2016-04-08 | Disposition: A | Discharge status: Home / Self Care | Payer: PRIVATE HEALTH INSURANCE | Attending: Emergency Medicine

## 2016-04-08 DIAGNOSIS — K2900 Acute gastritis without bleeding: Secondary | ICD-10-CM

## 2016-04-08 LAB — CBC WITH AUTOMATED DIFF
ABS. BASOPHILS: 0 10*3/uL (ref 0.0–0.1)
ABS. EOSINOPHILS: 0.3 10*3/uL (ref 0.0–0.4)
ABS. LYMPHOCYTES: 0.7 10*3/uL — ABNORMAL LOW (ref 0.8–3.5)
ABS. MONOCYTES: 0.5 10*3/uL (ref 0.0–1.0)
ABS. NEUTROPHILS: 12 10*3/uL — ABNORMAL HIGH (ref 1.8–8.0)
BASOPHILS: 0 % (ref 0–1)
EOSINOPHILS: 2 % (ref 0–7)
HCT: 42.8 % (ref 36.6–50.3)
HGB: 14.6 g/dL (ref 12.1–17.0)
LYMPHOCYTES: 5 % — ABNORMAL LOW (ref 12–49)
MCH: 30.7 PG (ref 26.0–34.0)
MCHC: 34.1 g/dL (ref 30.0–36.5)
MCV: 90.1 FL (ref 80.0–99.0)
MONOCYTES: 4 % — ABNORMAL LOW (ref 5–13)
NEUTROPHILS: 89 % — ABNORMAL HIGH (ref 32–75)
PLATELET: 244 10*3/uL (ref 150–400)
RBC: 4.75 M/uL (ref 4.10–5.70)
RDW: 12.8 % (ref 11.5–14.5)
WBC: 13.5 10*3/uL — ABNORMAL HIGH (ref 4.1–11.1)

## 2016-04-08 LAB — METABOLIC PANEL, COMPREHENSIVE
A-G Ratio: 1.1 (ref 1.1–2.2)
ALT (SGPT): 26 U/L (ref 12–78)
AST (SGOT): 21 U/L (ref 15–37)
Albumin: 3.9 g/dL (ref 3.5–5.0)
Alk. phosphatase: 72 U/L (ref 45–117)
Anion gap: 9 mmol/L (ref 5–15)
BUN/Creatinine ratio: 15 (ref 12–20)
BUN: 14 MG/DL (ref 6–20)
Bilirubin, total: 0.5 MG/DL (ref 0.2–1.0)
CO2: 25 mmol/L (ref 21–32)
Calcium: 8.5 MG/DL (ref 8.5–10.1)
Chloride: 106 mmol/L (ref 97–108)
Creatinine: 0.93 MG/DL (ref 0.70–1.30)
GFR est AA: >60 mL/min/{1.73_m2} (ref >60)
GFR est non-AA: >60 mL/min/{1.73_m2} (ref >60)
Globulin: 3.7 g/dL (ref 2.0–4.0)
Glucose: 115 mg/dL — ABNORMAL HIGH (ref 65–100)
Potassium: 3.5 mmol/L (ref 3.5–5.1)
Protein, total: 7.6 g/dL (ref 6.4–8.2)
Sodium: 140 mmol/L (ref 136–145)

## 2016-04-08 LAB — URINALYSIS W/MICROSCOPIC
Bacteria: NEGATIVE /hpf
Bilirubin: NEGATIVE
Blood: NEGATIVE
Glucose: NEGATIVE mg/dL
Ketone: NEGATIVE mg/dL
Leukocyte Esterase: NEGATIVE
Nitrites: NEGATIVE
Protein: NEGATIVE mg/dL
Specific gravity: 1.008 (ref 1.003–1.030)
Urobilinogen: 0.2 EU/dL (ref 0.2–1.0)
pH (UA): 7.5 (ref 5.0–8.0)

## 2016-04-08 LAB — LIPASE: Lipase: 188 U/L (ref 73–393)

## 2016-04-08 MED ORDER — ONDANSETRON HCL 4 MG TAB
4 mg | ORAL_TABLET | Freq: Three times a day (TID) | ORAL | 0 refills | Status: DC | PRN
Start: 2016-04-08 — End: 2018-04-15

## 2016-04-08 MED ORDER — PROCHLORPERAZINE MALEATE 5 MG TAB
5 mg | ORAL | Status: AC
Start: 2016-04-08 — End: 2016-04-08
  Administered 2016-04-08: 18:00:00 via ORAL

## 2016-04-08 MED ORDER — SODIUM CHLORIDE 0.9 % INJECTION
5 mg/mL | Freq: Once | INTRAMUSCULAR | Status: DC
Start: 2016-04-08 — End: 2016-04-08
  Administered 2016-04-08: 16:00:00 via INTRAVENOUS

## 2016-04-08 MED ORDER — SODIUM CHLORIDE 0.9 % INJECTION
40 mg | INTRAMUSCULAR | Status: AC
Start: 2016-04-08 — End: 2016-04-08
  Administered 2016-04-08: 16:00:00 via INTRAVENOUS

## 2016-04-08 MED ORDER — PROCHLORPERAZINE MALEATE 10 MG TAB
10 mg | ORAL_TABLET | Freq: Three times a day (TID) | ORAL | 0 refills | Status: AC | PRN
Start: 2016-04-08 — End: 2016-04-15

## 2016-04-08 MED ORDER — SODIUM CHLORIDE 0.9% BOLUS IV
0.9 % | Freq: Once | INTRAVENOUS | Status: AC
Start: 2016-04-08 — End: 2016-04-08
  Administered 2016-04-08: 13:00:00 via INTRAVENOUS

## 2016-04-08 MED ORDER — PANTOPRAZOLE 40 MG TAB, DELAYED RELEASE
40 mg | ORAL_TABLET | Freq: Every day | ORAL | 0 refills | Status: AC
Start: 2016-04-08 — End: 2016-05-08

## 2016-04-08 MED ORDER — PROCHLORPERAZINE MALEATE 10 MG TAB
10 mg | ORAL_TABLET | Freq: Three times a day (TID) | ORAL | 0 refills | Status: DC | PRN
Start: 2016-04-08 — End: 2016-04-08

## 2016-04-08 MED ORDER — PANTOPRAZOLE 40 MG TAB, DELAYED RELEASE
40 mg | ORAL_TABLET | Freq: Every day | ORAL | 0 refills | Status: DC
Start: 2016-04-08 — End: 2016-04-08

## 2016-04-08 MED ORDER — SODIUM CHLORIDE 0.9 % IV
5 mg/mL | Freq: Once | INTRAVENOUS | Status: DC
Start: 2016-04-08 — End: 2016-04-08
  Administered 2016-04-08: 16:00:00 via INTRAVENOUS

## 2016-04-08 MED ORDER — ONDANSETRON HCL 8 MG TAB
8 mg | ORAL_TABLET | Freq: Three times a day (TID) | ORAL | 0 refills | Status: DC | PRN
Start: 2016-04-08 — End: 2016-04-08

## 2016-04-08 MED ORDER — PROCHLORPERAZINE EDISYLATE 5 MG/ML INJECTION
5 mg/mL | Freq: Once | INTRAMUSCULAR | Status: DC
Start: 2016-04-08 — End: 2016-04-08

## 2016-04-08 MED ORDER — ONDANSETRON (PF) 4 MG/2 ML INJECTION
4 mg/2 mL | INTRAMUSCULAR | Status: AC
Start: 2016-04-08 — End: 2016-04-08
  Administered 2016-04-08: 13:00:00 via INTRAVENOUS

## 2016-04-08 MED ORDER — ONDANSETRON HCL 4 MG TAB
4 mg | ORAL_TABLET | Freq: Three times a day (TID) | ORAL | 0 refills | Status: DC | PRN
Start: 2016-04-08 — End: 2016-04-08

## 2016-04-08 MED ORDER — ONDANSETRON (PF) 4 MG/2 ML INJECTION
4 mg/2 mL | INTRAMUSCULAR | Status: AC
Start: 2016-04-08 — End: 2016-04-08

## 2016-04-08 MED ORDER — ONDANSETRON (PF) 4 MG/2 ML INJECTION
4 mg/2 mL | INTRAMUSCULAR | Status: AC
Start: 2016-04-08 — End: 2016-04-08
  Administered 2016-04-08: 16:00:00 via INTRAVENOUS

## 2016-04-08 MED ORDER — SODIUM CHLORIDE 0.9 % IV
5 mg/mL | Freq: Once | INTRAVENOUS | Status: DC
Start: 2016-04-08 — End: 2016-04-08
  Administered 2016-04-08: 17:00:00 via INTRAVENOUS

## 2016-04-08 MED FILL — ONDANSETRON (PF) 4 MG/2 ML INJECTION: 4 mg/2 mL | INTRAMUSCULAR | Qty: 2

## 2016-04-08 MED FILL — PROTONIX 40 MG INTRAVENOUS SOLUTION: 40 mg | INTRAVENOUS | Qty: 40

## 2016-04-08 MED FILL — PROCHLORPERAZINE MALEATE 5 MG TAB: 5 mg | ORAL | Qty: 1

## 2016-04-08 MED FILL — SODIUM CHLORIDE 0.9 % IV: INTRAVENOUS | Qty: 1000

## 2016-04-08 MED FILL — PROCHLORPERAZINE EDISYLATE 5 MG/ML INJECTION: 5 mg/mL | INTRAMUSCULAR | Qty: 2

## 2016-04-08 NOTE — ED Notes (Signed)
Dr Williams at bedside

## 2016-04-08 NOTE — ED Notes (Signed)
Patient verbalizes understanding of discharge instructions. Pt alert and oriented, appears in no acute distress, respirations equal and unlabored. Ambulatory upon discharge with steady gait.

## 2016-04-08 NOTE — ED Notes (Signed)
Pt requesting zofran instead of compazine, since it relieved his nausea. Dr Mayford KnifeWilliams notified.

## 2016-04-08 NOTE — ED Notes (Signed)
Pt reports abd pain is returning and has questions re: his discharge. Dr Mayford KnifeWilliams aware.

## 2016-04-08 NOTE — ED Provider Notes (Signed)
HPI Comments: 60 y.o. male with past medical history significant for psoriatic arthritis who presents from home, accompanied by wife, with chief complaint of vomiting.  Patient arrived home last night after traveling to [place] and 4 hours ago began with nausea and vomiting.  Patient has had 4 episodes of watery, pink-tinged emesis.  Patient reports mid abdominal pain after vomiting.  Yesterday evening he ate chips with chili powder, 24 hours ago he drank a double shot of espresso that caused "my stomach to burn," and 36 hours ago he took Celebrex for plantar fasciitis.  Patient had a normal colonoscopy 1 year ago and states he has regular BMs twice daily.  Patient has no history of GI bleeds or diverticulitis.  Patient denies melena, hematochezia, cough, congestion, headache, blurry/double vision, chest pain/pressure, shortness of breath, diarrhea, constipation, leg pain/swelling, back pain.  There are no other acute medical concerns at this time.  Social hx: Married, Nonsmoker.  PCP: [name], MD    Note written by Jake Church Alben Spittle, Neurosurgeon, as dictated by Evelena Leyden, MD 9:14 AM      The history is provided by the patient and the spouse.        Past Medical History:   Diagnosis Date   ??? Arthritis    ??? 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  Last seizure 1992-1993.   ??? Psoriatic arthritis (HCC)     Dr. Towanda Malkin   ??? Right lateral epicondylitis        Past Surgical History:   Procedure Laterality Date   ??? COLONOSCOPY N/A 03/20/2015    COLONOSCOPY performed by Wilfred Curtis, MD at [facility] ENDOSCOPY   ??? HX HERNIA REPAIR     ??? HX THORACOTOMY           Family History:   Problem Relation Age of Onset   ??? Heart Disease Father        Social History     Social History   ??? Marital status: MARRIED     Spouse name: N/A   ??? Number of children: N/A   ??? Years of education: N/A     Occupational History   ??? Not on file.     Social History Main Topics    ??? Smoking status: Never Smoker   ??? Smokeless tobacco: Never Used   ??? Alcohol use 0.0 oz/week     0 Standard drinks or equivalent per week      Comment: Social   ??? Drug use: No   ??? Sexual activity: Yes     Partners: Female     Birth control/ protection: None     Other Topics Concern   ??? Not on file     Social History Narrative         ALLERGIES: Review of patient's allergies indicates no known allergies.    Review of Systems   Constitutional: Negative for appetite change, chills and fever.   HENT: Negative for congestion.    Eyes: Negative for visual disturbance.   Respiratory: Negative for cough, chest tightness, shortness of breath and wheezing.    Cardiovascular: Negative for chest pain.   Gastrointestinal: Positive for abdominal pain, nausea and vomiting. Negative for diarrhea.   Genitourinary: Negative for dysuria and frequency.   Musculoskeletal: Negative for joint swelling.   Skin: Negative for rash.   Neurological: Negative for speech difficulty and headaches.   All other systems reviewed and are negative.  Vitals:    04/08/16 0849   BP: 127/75   Pulse: 98   Resp: 18   Temp: 98.5 ??F (36.9 ??C)   SpO2: 95%   Weight: 74 kg (163 lb 4 oz)   Height: 5\' 9"  (1.753 m)            Physical Exam   Constitutional: He is oriented to person, place, and time. He appears well-developed and well-nourished. No distress.   HENT:   Head: Normocephalic and atraumatic.   Nose: Nose normal.   Eyes: Conjunctivae are normal. Pupils are equal, round, and reactive to light. No scleral icterus.   Neck: Normal range of motion. Neck supple. No JVD present. No tracheal deviation present. No thyromegaly present.   Cardiovascular: Normal rate, regular rhythm and normal heart sounds.    No murmur heard.  Pulmonary/Chest: Effort normal and breath sounds normal. No respiratory distress. He has no wheezes. He has no rales.   Abdominal: Soft. Bowel sounds are normal. He exhibits no mass. There is  tenderness in the epigastric area. There is no rebound and no guarding.   Musculoskeletal: Normal range of motion. He exhibits no edema.   Neurological: He is alert and oriented to person, place, and time. No cranial nerve deficit. Coordination normal.   Skin: Skin is warm and dry. No rash noted. He is not diaphoretic. No erythema.   Psychiatric: He has a normal mood and affect. His behavior is normal.   Nursing note and vitals reviewed.  Note written by Leotis ShamesLauren A. Alben SpittleWeaver, Neurosurgeoncribe, as dictated by Evelena Leydenean C Marium Ragan, MD 9:14 AM       MDM  Number of Diagnoses or Management Options  Acute gastritis, presence of bleeding unspecified, unspecified gastritis type:   Nausea and vomiting, intractability of vomiting not specified, unspecified vomiting type:     ED Course   Comment By Time   The pt feels better with less nasuea - no vomiting now Evelena Leydenean C Harlym Gehling, MD 06/06 1006       Procedures      11:52 AM  Patient is feeling better and is ready to go home.  Plan to discharge with Protonix and Zofran.    11:54 AM  Nausea has returned - will order compazine prior to discharge.    12:19 PM  Patient is requesting zofran instead of compazine.  Patient also states abdominal pain has returned.    12:53 PM  Patient is refusing CT but is still nauseated.  Will order compazine; zofran rx given.    2:40 PM  Patient is feeling better and wants to go home.  Plan to discharge.

## 2016-04-08 NOTE — ED Notes (Signed)
Pt ambulatory to BR

## 2016-04-08 NOTE — ED Triage Notes (Signed)
Triage note: pt arrived home from [place] last night and this am pt started vomiting blood x4.

## 2016-04-10 NOTE — Progress Notes (Signed)
Patient on report as eligible for Care Coordination CM.  Left discreet message on voicemail with this CM contact information.  Will attempt to contact again to offer [facility] Care Management services.

## 2016-04-22 ENCOUNTER — Encounter: Attending: Rheumatology | Primary: Internal Medicine

## 2016-04-24 NOTE — Progress Notes (Signed)
Transition Of Care Note    Patient discharged from ED visit for severe gastritis.     Medical History:     Past Medical History:   Diagnosis Date   ??? Arthritis    ??? 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  Last seizure 1992-1993.   ??? Psoriatic arthritis (HCC)     Dr. Towanda Malkinoutlakis   ??? Right lateral epicondylitis        Care Manager contacted the patient by telephone to perform post ED discharge assessment.  Verified DOB and zip code with patient as identifiers.  Provided introduction to self, and explanation of the Nurse Care Manager role.      Patient states he is feeling so much better,  No further symptoms of gastrititis and no longer needs to take any medications for nausea.     Medication:   Performed medication reconciliation with patient, and patient verbalizes understanding of administration of home medications.  There were no barriers to obtaining medications identified at this time.    Support system:  patient    Discharge Instructions :  Reviewed discharge instructions with patient.  Patient verbalizes understanding of discharge instructions and follow-up care.       Red Flags:  Nausea returns, pain worsens give your PCP a call    Advance Care Planning:   Patient was offered the opportunity to discuss advance care planning:  no     Does patient have an Advance Directive:  no   If no, did you provide information on Caring Connections?  no     PCP/Specialist follow up: Patient scheduled to follow up with PCP as needed.  Reviewed red flags with patient, and patient verbalizes understanding.  Patient given an opportunity to ask questions. No other clinical/social/functional needs noted.   The patient agrees to contact the PCP office for questions related to their healthcare.  The patient expressed thanks, offered no additional questions and ended the call.

## 2016-05-15 NOTE — Progress Notes (Signed)
Resolving current episode, Transitions of care complete.  No further ED/UC or hospital admissions within 30 days post discharge. Patient attended follow-up appointments as directed.  No outreach from patient to ECMT.

## 2016-05-28 ENCOUNTER — Encounter: Attending: Rheumatology | Primary: Internal Medicine

## 2016-06-03 ENCOUNTER — Encounter: Attending: Rheumatology | Primary: Internal Medicine

## 2016-06-03 ENCOUNTER — Ambulatory Visit
Admit: 2016-06-03 | Discharge: 2016-06-03 | Payer: PRIVATE HEALTH INSURANCE | Attending: Rheumatology | Primary: Internal Medicine

## 2016-06-03 DIAGNOSIS — L4050 Arthropathic psoriasis, unspecified: Secondary | ICD-10-CM

## 2016-06-03 MED ORDER — ADALIMUMAB 40 MG/0.8 ML SUBCUTANEOUS PEN KIT
40 mg/0.8 mL | PACK | SUBCUTANEOUS | 6 refills | Status: DC
Start: 2016-06-03 — End: 2018-01-01

## 2016-06-03 NOTE — Progress Notes (Signed)
RHEUMATOLOGY PROBLEM LIST AND CHIEF COMPLAINT  1. Psoriatic arthritis - history of psoriasis, arthralgia, arthritis, and achilles enthesitis since 1998, diagnosed with PsA in 2002. Previously treated with methotrexate and Enbrel.    Therapy History:  Previous DMARDs: Enbrel   Current DMARDs: Humira (02/2016 - current)    INTERVAL HISTORY  This is a 60 y.o. Asian male.  Today, the patient complains of pain in the joints.  Location: foot  Severity:  6 on a scale of 0-10  Timing: all day   Duration:  4 months  Modifying factors:   Context/Associated signs and symptoms: The patient has started Humira 40 mg q2 weeks. There have been no adverse effects to the current medication.   He states that the arthritis and rash have improved but there is still some active psoriasis on his right lower leg. The patient denies any joint pain, joint swelling, or morning stiffness. The patient has no other complaints. He has questions regarding his medication and their infection rates.    He complains of plantar fasciitis and states he has done all of the exercises but the pain persists.       RHEUMATOLOGY REVIEW OF SYSTEMS   Positives as per history  Negatives as follows:  CONSTITUTlONAL:  Denies unexplained persistent fevers or weight change  RESPIRATORY:  No pleuritic pain, exertional dyspnea  CARDIOVASCULAR:  Denies chest pain  GASTRO:   Denies heartburn, abdominal pain, nausea, vomiting, diarrhea  SKIN:      MSK:    No morning stiffness >1 hour, persistent joint swelling, persistent joint pain    PAST MEDICAL HISTORY  Reviewed with patient, significant changes in medical history - yes, ER due to acute gastritis    FAMILY HISTORY  psoriasis - Uncle    MEDICATIONS  All the current medications were reviewed in detail.      PHYSICAL EXAM  Blood pressure 120/79, pulse 72, temperature 98.6 ??F (37 ??C), temperature source Oral, resp. rate 16, height  (1.753 m), weight 162 lb (73.5 kg), SpO2 94 %.   GENERAL APPEARANCE: Well-nourished adult in no acute distress.  EYES: No scleral erythema, conjunctival injection.  ENT: No oral ulcer, parotid enlargement.  NECK: No adenopathy, thyroid enlargement.  CARDIOVASCULAR: Heart rhythm is regular. No murmur, rub, gallop.  CHEST: Normal vesicular breath sounds. No wheezes, rales, pleural friction rubs.  ABDOMINAL: The abdomen is soft and nontender. Liver and spleen are nonpalpable. Bowel sounds are normal.  EXTREMITIES: There is no evidence of clubbing, cyanosis, edema.  SKIN: No palpable purpura, digital ulcer, abnormal thickening. Psoriatic plaques and active lesion over shins  covering ~2% BSA.   NEUROLOGICAL: Normal gait and station, full strength in upper and lower extremities, normal sensation to light touch.  MUSCULOSKELETAL:   Upper extremities - full range of motion, no tenderness, no swelling, no synovial thickening and no deformity of joints.  Lower extremities - full range of motion, no tenderness, no swelling, no synovial thickening and no deformity of joints.      LABS, RADIOLOGY AND PROCEDURES  Previous labs reviewed -Yes  Previous radiology reviewed -Yes  Previous procedures reviewed -Yes  Previous medical records reviewed/summarized -Yes    ASSESSMENT  1. Psoriatic arthritis - (Established problem - Partial response) - The patient's psoriasis has improved on humira 40 mg q2 weeks. His psoriatic lesions now only cover ~2% BSA. He continues to have active lesions on his left shin but I explained that Humira does not reach full efficacy until after 3 months.  If his rash does not improve by his next visit, I mentioned I may switch him to Cosyntex. For now, he should continue with his medication and return in 3 months for a follow up. I will check labs next visit.   2. Drug therapy monitoring for toxicity (biologic) - CBC, BUN, Cr, AST, ALT and albumin every 4 months    PLAN  1. Humira 40 mg q2 weeks  2. Return in 2 months    Violeta Lecount M. Allena Katz, MD   Adult and Pediatric Rheumatology     [facility]  [address] [place], [place] [address], Phone [phone], Fax [phone]     Visiting Assistant Professor of Pediatrics    Department of Pediatrics, [facility]   [address], [place], [place] [address], Phone [phone], Fax [phone]    There are no Patient Instructions on file for this visit.    cc:  [name], MD    Written by Everette Rank, scribe, as dictated by Neomia Dear. Allena Katz, M.D.

## 2016-07-18 NOTE — Telephone Encounter (Signed)
Patient informed through [facility] [employer] [facility] Humira approved by [facility], PA# [id number], good 07/17/2016-07/17/2018.

## 2016-09-02 ENCOUNTER — Ambulatory Visit
Admit: 2016-09-02 | Discharge: 2016-09-02 | Payer: PRIVATE HEALTH INSURANCE | Attending: Rheumatology | Primary: Internal Medicine

## 2016-09-02 DIAGNOSIS — L4050 Arthropathic psoriasis, unspecified: Secondary | ICD-10-CM

## 2016-09-02 MED ORDER — SECUKINUMAB 150 MG/ML SUBCUTANEOUS PEN INJECTOR
150 mg/mL | INJECTION | SUBCUTANEOUS | 11 refills | Status: AC
Start: 2016-09-02 — End: 2016-10-02

## 2016-09-02 NOTE — Progress Notes (Signed)
RHEUMATOLOGY PROBLEM LIST AND CHIEF COMPLAINT  1. Psoriatic arthritis - history of psoriasis, arthralgia, arthritis, and achilles enthesitis since 1998, diagnosed with PsA in 2002. Previously treated with methotrexate and Enbrel.    Therapy History:  Previous DMARDs: Enbrel, Humira (04/2016 - 08/2016; failed)   Current DMARDs: Cosentyx (ordered 08/2016)    INTERVAL HISTORY  This is a 60 y.o. Asian male.  Today, the patient complains of no pain in the joints.  Location: NA  Severity:  5 on a scale of 0-10  Timing: all day   Duration:  2 months  Modifying factors: Psoriatic Lesions on Legs  Context/Associated signs and symptoms: The patient presents with worsened psoriatic lesions over his LEs B/L. He continues with Humira 40 mg q2 weeks with no adverse effects. He discussed Cosentyx with his dermatologist and considering it for his treatment. He has no complaints regarding his joints. He denies new medications or medical issues.     RHEUMATOLOGY REVIEW OF SYSTEMS   Positives as per history  Negatives as follows:  CONSTITUTlONAL:  Denies unexplained persistent fevers or weight change  RESPIRATORY:  No pleuritic pain, exertional dyspnea  CARDIOVASCULAR:  Denies chest pain  GASTRO:   Denies heartburn, abdominal pain, nausea, vomiting, diarrhea  SKIN:      MSK:    No morning stiffness >1 hour, persistent joint swelling, persistent joint pain    PAST MEDICAL HISTORY  Reviewed with patient, significant changes in medical history - no    FAMILY HISTORY  psoriasis - Uncle    MEDICATIONS  All the current medications were reviewed in detail.      PHYSICAL EXAM  Blood pressure 124/86, pulse 76, temperature 97.7 ??F (36.5 ??C), temperature source Oral, resp. rate 16, height 5\' 9"  (1.753 m), weight 157 lb (71.2 kg), SpO2 98 %.  GENERAL APPEARANCE: Well-nourished adult in no acute distress.  EYES: No scleral erythema, conjunctival injection.  ENT: No oral ulcer, parotid enlargement.  NECK: No adenopathy, thyroid enlargement.   CARDIOVASCULAR: Heart rhythm is regular. No murmur, rub, gallop.  CHEST: Normal vesicular breath sounds. No wheezes, rales, pleural friction rubs.  ABDOMINAL: The abdomen is soft and nontender. Liver and spleen are nonpalpable. Bowel sounds are normal.  EXTREMITIES: There is no evidence of clubbing, cyanosis, edema.  SKIN: Psoriatic plaques and active lesion over shins covering ~6% BSA.   NEUROLOGICAL: Normal gait and station, full strength in upper and lower extremities, normal sensation to light touch.  MUSCULOSKELETAL:   Upper extremities - full range of motion, no tenderness, no swelling, no synovial thickening and no deformity of joints.  Lower extremities - full range of motion, no tenderness, no swelling, no synovial thickening and no deformity of joints.      LABS, RADIOLOGY AND PROCEDURES   Previous labs reviewed -Yes    ASSESSMENT  1. Psoriatic arthritis - (Established problem - Partial response) - The patient's psoriasis has worsened on exam, covering ~6% BSA. The patient has failed Humira so I want him to stop the medication and I will start him on Cosentyx 150 mg monthly after 5 week load. If he does not improve on this dosage, I will increase it to 300 mg monthly. He should return in 3 months for a follow up.   2. Drug therapy monitoring for toxicity (biologic) - CBC, BUN, Cr, AST, ALT and albumin every 4 months    PLAN  1. Stop Humira  2. Start Cosentyx 150 mg monthly  3. Return in 2 months  Ondra Deboard M. Allena KatzPatel, MD  Adult and Pediatric Rheumatology     [facility] [place]  [address], [place], [place] [id number], Phone [phone], Fax [phone]   E-mail: [email or web address]    Visiting Assistant Professor of Pediatrics    Department of Pediatrics, [facility]   [id number], [place], [place] [id number], Phone [phone], Fax [phone]  E-mail: [email or web address]    There are no Patient Instructions on file for this visit.    cc:   Donzetta Matterslarence G Childress, MD    Written by Everette RankJustin Quion, scribe, as dictated by Neomia DearAarat M. Allena KatzPatel, M.D.

## 2016-09-18 NOTE — Telephone Encounter (Signed)
Emailed patient through [facility] to inform him the Cosentyx has been approved by [facility], Loading dose approved , 09/11/2016-10/16/2016 PA# [id number] , and the maintenance doses approved 09/11/2016-09/11/2018, PA# [id number]. Instructed to let the office know if he has received the Cosentyx.

## 2016-10-01 NOTE — Telephone Encounter (Signed)
Would like to talk to Dr. Demetrius CharityP about changing the dosage of a medication. Stated he knows about going though MyChart but he would rather talk with Dr. PDemetrius Charity

## 2016-10-02 NOTE — Telephone Encounter (Signed)
Called Dr Dewayne ShorterShanker no answer, LM/Vm to call Dr Allena KatzPatel on his cell [phone] when he is available to talk.

## 2016-10-22 NOTE — Telephone Encounter (Signed)
Pt last visit 09/02/16

## 2016-10-23 MED ORDER — SECUKINUMAB 150 MG/ML SUBCUTANEOUS PEN INJECTOR
150 mg/mL | SUBCUTANEOUS | 6 refills | Status: DC
Start: 2016-10-23 — End: 2016-12-09

## 2016-12-02 ENCOUNTER — Ambulatory Visit
Admit: 2016-12-02 | Discharge: 2016-12-02 | Payer: PRIVATE HEALTH INSURANCE | Attending: Internal Medicine | Primary: Internal Medicine

## 2016-12-02 DIAGNOSIS — Z0000 Encounter for general adult medical examination without abnormal findings: Secondary | ICD-10-CM

## 2016-12-02 MED ORDER — AZITHROMYCIN 250 MG TAB
250 mg | ORAL_TABLET | ORAL | 0 refills | Status: DC
Start: 2016-12-02 — End: 2017-10-21

## 2016-12-02 MED ORDER — ATOVAQUONE-PROGUANIL 250 MG-100 MG TAB
250-100 mg | ORAL_TABLET | Freq: Every day | ORAL | 0 refills | Status: DC
Start: 2016-12-02 — End: 2017-10-18

## 2016-12-02 NOTE — Progress Notes (Signed)
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 Complete Physical     Here for physical.    He is seeing Rheum with Dr. Allena Katz with [facility] currently.  He had last labs in ED June 2017.    He will do next fasting labs with labs from Dr. Candy Sledge next week.    He is planning going to go to [place] for 4wks.  Visiting his father and helping with his care there.  Travel to [place] 3rd week Feb.  See separate encounter for travel visit.    Notes interested in seeing Cardiology--referral reviewed.    PSA and exam for screening reviewed today at visit.      Prior to Admission medications    Medication Sig Start Date End Date Taking? Authorizing Provider   secukinumab (COSENTYX PEN) 150 mg/mL pnij 1 Pen by SubCUTAneous route every seven (7) days. 10/23/16  Yes Rigoberto Noel, MD   fluticasone (FLONASE) 50 mcg/actuation nasal spray as needed. 02/01/15  Yes Historical Provider   loratadine (CLARITIN) 10 mg tablet Take 10 mg by mouth daily.   Yes Historical Provider   adalimumab (HUMIRA PEN) 40 mg/0.8 mL injection pen 0.8 mL by SubCUTAneous route every fourteen (14) days. 06/03/16   Rigoberto Noel, MD   ondansetron hcl (ZOFRAN, AS HYDROCHLORIDE,) 4 mg tablet Take 1 Tab by mouth every eight (8) hours as needed for Nausea.  Patient not taking: Reported on 06/03/2016 04/08/16   Evelena Leyden, MD   valACYclovir (VALTREX) 1 gram tablet Take 1 Tab by mouth daily.  Patient not taking: Reported on 06/03/2016 07/03/15   Lance Bosch, MD   lidocaine (LIDODERM) 5 % 1 Patch by TransDERmal route every twenty-four (24) hours. Apply patch to the affected area for 12 hours a day and remove for 12 hours a day.  Patient not taking: Reported on 06/03/2016 06/22/15   Lance Bosch, MD   gabapentin (NEURONTIN) 300 mg capsule Take 1 Cap by mouth three (3) times daily.  Patient not taking: Reported on 02/19/2016 06/20/15   Lance Bosch, MD    etanercept (ENBREL) 50 mg/mL (0.98 mL) injection 50 mg by SubCUTAneous route every seven (7) days.  Patient not taking: Reported on 06/03/2016 04/27/12   Erenest Rasher, MD        ROS    Vitals:    12/02/16 1450 12/02/16 1501   BP:  126/82   Pulse:  74   Resp:  17   Temp:  98.1 ??F (36.7 ??C)   SpO2:  97%   Weight:  163 lb 12.8 oz (74.3 kg)   Height: 5\' 9"  (1.753 m) 5\' 9"  (1.753 m)   PainSc:   0 - No pain   0 - No pain        Physical Exam:     Physical Exam   Constitutional: He appears well-developed and well-nourished. No distress.   HENT:   Head: Normocephalic and atraumatic.   Right Ear: External ear normal.   Left Ear: External ear normal.   Mouth/Throat: Oropharynx is clear and moist. No oropharyngeal exudate.   TM's normal bilat.   Eyes: Conjunctivae are normal. Right eye exhibits no discharge. Left eye exhibits no discharge. No scleral icterus.   Neck: Normal range of motion. Neck supple. No tracheal deviation present. No thyromegaly present.   Cardiovascular: Normal rate, regular  rhythm, normal heart sounds and intact distal pulses.  Exam reveals no gallop and no friction rub.    No murmur heard.  Pulmonary/Chest: Effort normal and breath sounds normal. No stridor. No respiratory distress. He has no wheezes. He has no rales.   Abdominal: Soft. Bowel sounds are normal. He exhibits no distension and no mass. There is no tenderness. There is no rebound and no guarding.   Genitourinary: Rectum normal and prostate normal. Rectal exam shows guaiac negative stool.   Genitourinary Comments: Normal prostate size--slight prominence left lobe on lateral aspect, but no noduule noted.  Prostate smooth, symmetric, without tenderness or nodules.  No external hemorrhoids noted on DRE.   No pain with exam or internal hemorrhoids noted.  No blood on glove. Heme negative stool.   Musculoskeletal: He exhibits no edema, tenderness or deformity.   Midline spine.   Lymphadenopathy:     He has no cervical adenopathy.    Neurological: He is alert. He exhibits normal muscle tone. Coordination normal.   Skin: Skin is warm. No rash noted. He is not diaphoretic. No erythema. No pallor.   Psychiatric: He has a normal mood and affect. His behavior is normal. Judgment and thought content normal.       Assessment and Plan:       ICD-10-CM ICD-9-CM    1. Well adult exam Z00.00 V70.0 CBC WITH AUTOMATED DIFF      METABOLIC PANEL, COMPREHENSIVE      LIPID PANEL      HEMOGLOBIN A1C WITH EAG      PSA W/ REFLX FREE PSA      CK   2. FH: CAD (coronary artery disease) Z82.49 V17.3 LIPID PANEL      CK      REFERRAL TO CARDIOLOGY   3. Encounter for immunization Z23 V03.89 TETANUS, DIPHTHERIA TOXOIDS AND ACELLULAR PERTUSSIS VACCINE (TDAP), IN INDIVIDS. >=7, IM   4. Encounter for counseling for travel Z71.89 V65.49 azithromycin (ZITHROMAX) 250 mg tablet      atovaquone-proguanil (MALARONE) 250-100 mg per tablet       1.  He will do fasting labs after seen next week with Dr. Beryl MeagerPatal with lab orders there.    2.  Interested in seeing Dr. Dahlia ClientBrowning to eval [facility] risk.    3.  Updating reviewed--last done 8-3974yrs ago per pt.    He will clarify coverage for PCV-13 and PPSV-23.  Reviewed Shingrix vaccine at visit.    4.  Reviewed at visit.      Follow-up Disposition:  Return in about 1 year (around 12/02/2017) for yearly physical.  lab results and schedule of future lab studies reviewed with patient  reviewed medications and side effects in detail    For additional documentation of information and/or recommendations discussed this visit, please see notes in instructions.      Plan and evaluation (above) reviewed with pt at visit  Patient voiced understanding of plan and provided with time to ask/review questions.  After Visit Summary (AVS) provided to pt after visit with additional instructions as needed/reviewed.

## 2016-12-02 NOTE — Progress Notes (Signed)
FOREIGN TRAVEL CLINIC ASSESSMENT                Date of Departure: 3rd week Feb 2018         Date of return/duration of travel:  28days         Countries to be visited, or transited, in exact chronological order:  [place]    Travel is primarily:  [employer] &  [place]  Combination of above  Y  Visiting Friends & Relatives (VFR).  Visiting father to coordinate his care there.    N  [employer]      Past Medical History:  Past Medical History:   Diagnosis Date   ??? Arthritis    ??? 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  Last seizure 1992-1993.   ??? Psoriatic arthritis (HCC)     Dr. Towanda Malkin   ??? Right lateral epicondylitis        Travel History:  Prior travel to other countries to which vaccines recommended/required? Yes--grew up in [place].    Notes clinical history of Hepatitis A in [place].    List of Countries:  [place].    Vaccination History:  Are routine childhood immunizations completed and up to date?    Immunization History   Administered Date(s) Administered   ??? Influenza Vaccine 07/31/2014, 08/03/2016   ??? Tdap 12/02/2016       Did you grow up in the [place]? No    Prior travel vaccines received:  Thinks last Typhoid injection Sept 2016.  He will travel return prior to Sept 2018.  Reviewed 63yr repeat vaccine if needed here as nurse visit.      Vaccine indications, contraindications, risks, benefits, schedules, routes discussed with patient and questions, comments discussed.    RECOMMENDED Vaccines, Not received:  None.  Updated Tdap reviewed.  Notes last 8-9 years ago--not done here prior.          PRIOR ANTI-MALARIALS: Malarone--reviewed dosing as below.    PRIOR ANTI-DIARRHEAL THERAPY:  Azithro reviewed as below.      OTHER PRIOR TRAVEL MEDS:  None.       PHYSICAL EXAM    Blood pressure 126/82, pulse 74, temperature 98.1 ??F (36.7 ??C), resp. rate 17, height 5\' 9"  (1.753 m), weight 163 lb 12.8 oz (74.3 kg), SpO2 97 %.     General appearance - alert, well appearing, and in no distress  Eye-Conjunctivae clear bilaterally  Chest - no tachypnea, retractions or cyanosis   Heart - normal rate      ASSESSMENT and PLAN    ICD-10-CM ICD-9-CM    1. Encounter for counseling for travel Z71.89 V65.49 azithromycin (ZITHROMAX) 250 mg tablet      atovaquone-proguanil (MALARONE) 250-100 mg per tablet   2. FH: CAD (coronary artery disease) Z82.49 V17.3 LIPID PANEL      CK      REFERRAL TO CARDIOLOGY   3. Well adult exam Z00.00 V70.0 CBC WITH AUTOMATED DIFF      METABOLIC PANEL, COMPREHENSIVE      LIPID PANEL      HEMOGLOBIN A1C WITH EAG      PSA W/ REFLX FREE PSA      CK   4. Encounter for immunization Z23 V03.89 TETANUS, DIPHTHERIA TOXOIDS AND ACELLULAR PERTUSSIS VACCINE (TDAP), IN INDIVIDS. >=7, IM       1.  Medications reviewed at visit.    4.  Updated as >51yrs for travel.  If typhodi IM needed, he will return here for nurse-visit as reveiwed.      Reviewed anti-malarial medications & dosing with pt at visit.  Reviewed travel diarrhea use of azithromycin.    All scripts reviewed with patient at visit.    Information given to and reviewed with patient included:  traveler's diarrhea symptoms and management; insect precautions.       Follow-up Disposition:  Return in about 1 year (around 12/02/2017) for yearly physical.  reviewed medications and side effects in detail    For additional documentation of information and/or recommendations discussed this visit, please see notes in instructions.      Plan and evaluation (above) reviewed with pt at visit  Patient voiced understanding of plan and provided with time to ask/review questions.  After Visit Summary (AVS) provided to pt after visit with additional instructions as needed/reviewed.

## 2016-12-02 NOTE — Patient Instructions (Addendum)
CPT codes for vaccines:    [phone]  Pneumococcal polysaccharide vaccine, 23-valent, adult or immunosuppressed patient dosage, for use in individuals 2 years or older, for subcutaneous or intramuscular use 33 pneumococcal polysaccharide PPV23    90670 Pneumococcal conjugate vaccine, 13 valent, for intramuscular use 133 Pneumococcal conjugate PCV 13      Please clarify with interval your can receive above, if limited by insurance--typically 8 weeks minimum.      Think this is the Shingrix vaccine code/info:  90750 Zoster (shingles) vaccine (HZV), recombinant, sub-unit, adjuvanted, for intramuscular use 187 zoster subunit      If you need the typhoid injection for travel, we have here.         Well Visit, Men 50 to 15: Care Instructions  Your Care Instructions    Physical exams can help you stay healthy. Your doctor has checked your overall health and may have suggested ways to take good care of yourself. He or she also may have recommended tests. At home, you can help prevent illness with healthy eating, regular exercise, and other steps.  Follow-up care is a key part of your treatment and safety. Be sure to make and go to all appointments, and call your doctor if you are having problems. It's also a good idea to know your test results and keep a list of the medicines you take.  How can you care for yourself at home?  ?? Reach and stay at a healthy weight. This will lower your risk for many problems, such as obesity, diabetes, heart disease, and high blood pressure.  ?? Get at least 30 minutes of exercise on most days of the week. Walking is a good choice. You also may want to do other activities, such as running, swimming, cycling, or playing tennis or team sports.  ?? Do not smoke. Smoking can make health problems worse. If you need help quitting, talk to your doctor about stop-smoking programs and medicines. These can increase your chances of quitting for good.   ?? Protect your skin from too much sun. When you're outdoors from 10 a.m. to 4 p.m., stay in the shade or cover up with clothing and a hat with a wide brim. Wear sunglasses that block UV rays. Even when it's cloudy, put broad-spectrum sunscreen (SPF 30 or higher) on any exposed skin.  ?? See a dentist one or two times a year for checkups and to have your teeth cleaned.  ?? Wear a seat belt in the car.  ?? Limit alcohol to 2 drinks a day. Too much alcohol can cause health problems.  Follow your doctor's advice about when to have certain tests. These tests can spot problems early.  ?? Cholesterol. Your doctor will tell you how often to have this done based on your overall health and other things that can increase your risk for heart attack and stroke.  ?? Blood pressure. Have your blood pressure checked during a routine doctor visit. Your doctor will tell you how often to check your blood pressure based on your age, your blood pressure results, and other factors.  ?? Prostate exam. Talk to your doctor about whether you should have a blood test (called a PSA test) for prostate cancer. Experts disagree on whether men should have this test. Some experts recommend that you discuss the benefits and risks of the test with your doctor.  ?? Diabetes. Ask your doctor whether you should have tests for diabetes.  ?? Vision. Some experts recommend that you have  yearly exams for glaucoma and other age-related eye problems starting at age 61.  ?? Hearing. Tell your doctor if you notice any change in your hearing. You can have tests to find out how well you hear.  ?? Colon cancer. You should begin tests for colon cancer at age 61. You may have one of several tests. Your doctor will tell you how often to have tests based on your age and risk. Risks include whether you already had a precancerous polyp removed from your colon or whether your parent, brother, sister, or child has had colon cancer.   ?? Heart attack and stroke risk. At least every 4 to 6 years, you should have your risk for heart attack and stroke assessed. Your doctor uses factors such as your age, blood pressure, cholesterol, and whether you smoke or have diabetes to show what your risk for a heart attack or stroke is over the next 10 years.  ?? Abdominal aortic aneurysm. Ask your doctor whether you should have a test to check for an aneurysm. You may need a test if you ever smoked or if your parent, brother, sister, or child has had an aneurysm.  When should you call for help?  Watch closely for changes in your health, and be sure to contact your doctor if you have any problems or symptoms that concern you.  Where can you learn more?  Go to InsuranceStats.cahttp://www.healthwise.net/GoodHelpConnections.  Enter [phone] in the search box to learn more about "Well Visit, Men 50 to 6465: Care Instructions."  Current as of: Mar 14, 2016  Content Version: 11.4  ?? 2006-2017 Healthwise, Inc[employer]. Care instructions adapted under license by [employer] (which disclaims liability or warranty for this information). If you have questions about a medical condition or this instruction, always ask your healthcare professional. Healthwise, Incorporated disclaims any warranty or liability for your use of this information.

## 2016-12-02 NOTE — Progress Notes (Signed)
Rm 16    Chief Complaint   Patient presents with   ??? Complete Physical         Health Maintenance Due   Topic Date Due   ??? DTaP/Tdap/Td series (1 - Tdap) 10/30/1977   ??? FOBT Q 1 YEAR AGE 61-75  10/30/2006   ??? Influenza Age 29 to Adult  06/03/2016   ??? ZOSTER VACCINE AGE 43>  08/30/2016     Patient is due for Dtap, zoxter, flu & FOBT    1. Have you been to the ER, urgent care clinic since your last visit?  Hospitalized since your last visit?No    2. Have you seen or consulted any other health care providers outside of the [facility] since your last visit?  Include any pap smears or colon screening. No    Learning Assessment 02/13/2015   PRIMARY LEARNER Patient   HIGHEST LEVEL OF EDUCATION - PRIMARY LEARNER  > 4 YEARS OF COLLEGE   BARRIERS PRIMARY LEARNER NONE   PRIMARY LANGUAGE ENGLISH   LEARNER PREFERENCE PRIMARY READING   ANSWERED BY patient   RELATIONSHIP SELF     Abuse Screening Questionnaire 12/02/2016   Do you ever feel afraid of your partner? N   Are you in a relationship with someone who physically or mentally threatens you? N   Is it safe for you to go home? Y     Fall Risk Assessment, last 12 mths 12/02/2016   Able to walk? Yes   Fall in past 12 months? No     PHQ over the last two weeks 12/02/2016   Little interest or pleasure in doing things Not at all   Feeling down, depressed or hopeless Not at all   Total Score PHQ 2 0     Living medical will information given with avs today

## 2016-12-09 ENCOUNTER — Ambulatory Visit
Admit: 2016-12-09 | Discharge: 2016-12-09 | Payer: PRIVATE HEALTH INSURANCE | Attending: Rheumatology | Primary: Internal Medicine

## 2016-12-09 DIAGNOSIS — L4050 Arthropathic psoriasis, unspecified: Secondary | ICD-10-CM

## 2016-12-09 MED ORDER — SECUKINUMAB 150 MG/ML SUBCUTANEOUS PEN INJECTOR
150 mg/mL | INJECTION | SUBCUTANEOUS | 11 refills | Status: AC
Start: 2016-12-09 — End: 2017-01-08

## 2016-12-09 MED ORDER — CELECOXIB 200 MG CAP
200 mg | ORAL_CAPSULE | Freq: Two times a day (BID) | ORAL | 3 refills | Status: AC
Start: 2016-12-09 — End: 2017-01-08

## 2016-12-09 NOTE — Progress Notes (Signed)
RHEUMATOLOGY PROBLEM LIST AND CHIEF COMPLAINT  1. Psoriatic arthritis - history of psoriasis, arthralgia, arthritis, and achilles enthesitis since 1998, diagnosed with PsA in 2002. Previously treated with methotrexate and Enbrel.    Therapy History:  Previous DMARDs: Enbrel, Humira (04/2016 - 08/2016; failed)   Current DMARDs: Cosentyx (10/2016 - current)    INTERVAL HISTORY  This is a 61 y.o. Asian male.  Today, the patient complains of pain in the joints.  Location: wrist, cervical spine  Severity:  2 on a scale of 0-10  Timing: all day   Duration:  2 months  Context/Associated signs and symptoms: The patient complains of intermittent pain, swelling, and stiffness of his right middle digit, sternal pain, neck, right wrist, and TMJ. These symptoms are not persistent and tend to resolve on their own. He mentions improvement of psoriasis over his face and legs, stating it is not as red and itchy. He continues with cosentyx 150 mg monthly. He is leaving for [place] in 2 weeks. He mentions previous improvement in joint pain with celebrex.    RHEUMATOLOGY REVIEW OF SYSTEMS   Positives as per history  Negatives as follows:  CONSTITUTlONAL:  Denies unexplained persistent fevers or weight change  RESPIRATORY:  No pleuritic pain, exertional dyspnea  CARDIOVASCULAR:  Denies chest pain  GASTRO:   Denies heartburn, abdominal pain, nausea, vomiting, diarrhea  SKIN:      MSK:    No morning stiffness >1 hour, persistent joint swelling, persistent joint pain    PAST MEDICAL HISTORY  Reviewed with patient, significant changes in medical history - no    FAMILY HISTORY  psoriasis - Uncle    MEDICATIONS  All the current medications were reviewed in detail.      PHYSICAL EXAM  Blood pressure 125/81, pulse 71, temperature 98.3 ??F (36.8 ??C), temperature source Oral, resp. rate 16, weight 161 lb 6.4 oz (73.2 kg), SpO2 97 %.  GENERAL APPEARANCE: Well-nourished adult in no acute distress.  EYES: No scleral erythema, conjunctival injection.   ENT: No oral ulcer, parotid enlargement.  NECK: No adenopathy, thyroid enlargement.  CARDIOVASCULAR: Heart rhythm is regular. No murmur, rub, gallop.  CHEST: Normal vesicular breath sounds. No wheezes, rales, pleural friction rubs.  ABDOMINAL: The abdomen is soft and nontender. Liver and spleen are nonpalpable. Bowel sounds are normal.  EXTREMITIES: There is no evidence of clubbing, cyanosis, edema.  SKIN: Psoriatic plaques and active lesion over shins covering ~5% BSA, active lesion over lateral left ankle  NEUROLOGICAL: Normal gait and station, full strength in upper and lower extremities, normal sensation to light touch.  MUSCULOSKELETAL:   Upper extremities - full range of motion, no tenderness, no swelling, no synovial thickening and no deformity of joints.  Lower extremities - full range of motion, no tenderness, no swelling, no synovial thickening and no deformity of joints.      LABS, RADIOLOGY AND PROCEDURES   Previous labs reviewed -Yes    ASSESSMENT  1. Psoriatic arthritis - (Established problem - Partial response) - The patient's psoriasis has improved on exam from ~6% BSA to ~5% BSA. However, he still has active psoriasis over his left ankle so we will increase his Cosentyx to 300 mg monthly. His enthesitis-related issues are interesting as they worsened coming off previous biologic. For now, he should take Celebrex 400 mg BID for this. He should return in 4 months for a follow up.   2. Drug therapy monitoring for toxicity (biologic) - CBC, BUN, Cr, AST, ALT and albumin every  4 months    PLAN  1. Increase Cosentyx to 300 mg monthly  2. Celebrex for joint pain  3. Return in 4 months    Marquavious Nazar M. Allena KatzPatel, MD  Adult and Pediatric Rheumatology     [facility]  [address], [place], [place] [id number], Phone [phone], Fax [phone]   E-mail: [email or web address]    Visiting Assistant Professor of Pediatrics     Department of Pediatrics, [facility]   [id number], [place], [place] [address], Phone [phone], Fax [phone]  E-mail: [email or web address]    There are no Patient Instructions on file for this visit.    cc:  Donzetta Matterslarence G Childress, MD    Written by Everette RankJustin Quion, scribe, as dictated by Neomia DearAarat M. Allena KatzPatel, M.D.

## 2016-12-20 LAB — CBC WITH AUTOMATED DIFF
ABS. BASOPHILS: 0.1 10*3/uL (ref 0.0–0.2)
ABS. EOSINOPHILS: 0.5 10*3/uL — ABNORMAL HIGH (ref 0.0–0.4)
ABS. IMM. GRANS.: 0 10*3/uL (ref 0.0–0.1)
ABS. MONOCYTES: 0.6 10*3/uL (ref 0.1–0.9)
ABS. NEUTROPHILS: 3.7 10*3/uL (ref 1.4–7.0)
Abs Lymphocytes: 1.9 10*3/uL (ref 0.7–3.1)
BASOPHILS: 1 %
EOSINOPHILS: 8 %
HCT: 39.5 % (ref 37.5–51.0)
HGB: 13.1 g/dL (ref 13.0–17.7)
IMMATURE GRANULOCYTES: 0 %
Lymphocytes: 28 %
MCH: 30 pg (ref 26.6–33.0)
MCHC: 33.2 g/dL (ref 31.5–35.7)
MCV: 91 fL (ref 79–97)
MONOCYTES: 9 %
NEUTROPHILS: 54 %
PLATELET: 233 10*3/uL (ref 150–379)
RBC: 4.36 x10E6/uL (ref 4.14–5.80)
RDW: 13.4 % (ref 12.3–15.4)
WBC: 6.8 10*3/uL (ref 3.4–10.8)

## 2016-12-20 LAB — METABOLIC PANEL, COMPREHENSIVE
A-G Ratio: 1.5 (ref 1.2–2.2)
ALT (SGPT): 19 IU/L (ref 0–44)
AST (SGOT): 18 IU/L (ref 0–40)
Albumin: 4.1 g/dL (ref 3.6–4.8)
Alk. phosphatase: 66 IU/L (ref 39–117)
BUN/Creatinine ratio: 12 (ref 10–24)
BUN: 11 mg/dL (ref 8–27)
Bilirubin, total: 0.3 mg/dL (ref 0.0–1.2)
CO2: 27 mmol/L (ref 18–29)
Calcium: 9.1 mg/dL (ref 8.6–10.2)
Chloride: 101 mmol/L (ref 96–106)
Creatinine: 0.91 mg/dL (ref 0.76–1.27)
GLOBULIN, TOTAL: 2.8 g/dL (ref 1.5–4.5)
Glucose: 95 mg/dL (ref 65–99)
Potassium: 4.2 mmol/L (ref 3.5–5.2)
Protein, total: 6.9 g/dL (ref 6.0–8.5)
Sodium: 143 mmol/L (ref 134–144)

## 2016-12-20 LAB — CK: Creatine Kinase,Total: 134 U/L (ref 24–204)

## 2016-12-20 LAB — HEMOGLOBIN A1C WITH EAG
Estimated average glucose: 105 mg/dL
Hemoglobin A1c: 5.3 % (ref 4.8–5.6)

## 2016-12-20 LAB — PSA W/ REFLX FREE PSA: Prostate Specific Ag: 0.6 ng/mL (ref 0.0–4.0)

## 2016-12-20 LAB — LIPID PANEL
Cholesterol, total: 172 mg/dL (ref 100–199)
HDL Cholesterol: 36 mg/dL — ABNORMAL LOW (ref >39)
LDL, calculated: 101 mg/dL — ABNORMAL HIGH (ref 0–99)
Triglyceride: 175 mg/dL — ABNORMAL HIGH (ref 0–149)
VLDL, calculated: 35 mg/dL (ref 5–40)

## 2017-01-21 ENCOUNTER — Institutional Professional Consult (permissible substitution): Attending: Internal Medicine | Primary: Internal Medicine

## 2017-01-21 NOTE — Progress Notes (Signed)
Scheduled for Prevnar-13 and Shingles vaccine.    He is immunocompromised on immunosuppressive therapy for psoriatic arthritis.  He would need to get Shingrix when available, but reviewed with nurse coordinator and have not ordered at this time.    He can get the PPSV-23 (other pneumococcal vaccine) in 8-12 weeks if covered by his insurance.    We can let him know if we would have the vaccine here by then, if covered in clinic.    Diagnoses and all orders for this visit:    1. Encounter for immunization  -     Pneumococcal conjugate 13 valent, IM (PREVNAR-13)      Appt re-scheduled due to weather.

## 2017-02-11 ENCOUNTER — Institutional Professional Consult (permissible substitution)
Admit: 2017-02-11 | Discharge: 2017-02-11 | Payer: PRIVATE HEALTH INSURANCE | Attending: Internal Medicine | Primary: Internal Medicine

## 2017-02-11 DIAGNOSIS — Z23 Encounter for immunization: Secondary | ICD-10-CM

## 2017-02-11 NOTE — Progress Notes (Signed)
Prior order from 01-21-17 visit not able to be charted against.    Prior visit cancelled/re-scheduled for vaccines only.    Scheduled for Prevnar-13 and Shingles vaccine.  ??  He is immunocompromised on immunosuppressive therapy for psoriatic arthritis.  He would need to get Shingrix when available, but reviewed with nurse coordinator and have not ordered at this time.  ??  He can get the PPSV-23 (other pneumococcal vaccine) in 8-12 weeks if covered by his insurance.  ??  We can let him know if we would have the Shingrix vaccine here by then, if covered in clinic.      Diagnoses and all orders for this visit:    1. Encounter for immunization  -     PNEUMOCOCCAL CONJ VACCINE 13 VALENT IM (Age 61 and over)

## 2017-02-17 ENCOUNTER — Ambulatory Visit
Admit: 2017-02-17 | Discharge: 2017-02-17 | Payer: PRIVATE HEALTH INSURANCE | Attending: Internal Medicine | Primary: Internal Medicine

## 2017-02-17 DIAGNOSIS — Z8249 Family history of ischemic heart disease and other diseases of the circulatory system: Secondary | ICD-10-CM

## 2017-02-17 NOTE — Progress Notes (Signed)
Anthony Short: Anthony Short  [phone]  Requesting/referring provider: Dr. 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  No chest pain. He does have PA, on cosyntax x 5 months, before that on Humira. Discussed cardiac risks of the PA.   He has his fitbit, walking 7-11k a day. Goes to the community gym 2-3 times a week. Runs 1.25 miles on the treadmill. More plant based diet. He was concerned about his abd weight and has lost 3-4 pounds.   Wants to know about aspirin.   He is not stressed, work is good.   He sleeps ok, more like 6 hours a night. No coffee after 3pm.     Assessment/Plan:  1. Psoriatic arthritis - history of psoriasis, arthralgia, arthritis, and achilles enthesitis since 1998, diagnosed with PsA in 2002. Previously treated with methotrexate and Enbrel.  -discussed PsA and CAD  2. Family history of CAD   -will pursue calcium score for risk stratification, discussed  3. Dyslipidemia- LDL 101, TG 175  -plant based diet, discussed aspirin, statin, etc.   4. Dad with AAA- abd ao [place] ok in 2016  5. Murmur of MR- echo to evaluate    Soc no tob no etoh  Fhx dad with CABG 1992, then AAA repair at 48 in [place], brother with stent at 44, mother with myeloproliferative anemia, transfusions, died at 18. Dad not tob no eoth.   He  has a past medical history of Arthritis; History of seizure; Psoriatic arthritis (HCC); and Right lateral epicondylitis.    Cardiovascular ROS: no chest pain or dyspnea on exertion  Respiratory ROS: no cough, shortness of breath, or wheezing  Neurological ROS: no TIA or stroke symptoms  All other systems negative except as above.     PE  Vitals:    02/17/17 0847   BP: 110/84   Pulse: 66   Resp: 16   Weight: 159 lb 12.8 oz (72.5 kg)   Height:  (1.753 m)    Body mass index is 23.6 kg/(m^2).   General appearance - alert, well appearing, and in no distress   Mental status - affect appropriate to mood  Eyes - sclera anicteric, moist mucous membranes  Neck - supple, no significant adenopathy  Lymphatics - no  lymphadenopathy  Chest - clear to auscultation, no wheezes, rales or rhonchi  Heart - normal rate, regular rhythm, normal S1, S2, no murmurs, rubs, clicks or gallops  Abdomen - soft, nontender, nondistended, no masses or organomegaly  Back exam - full range of motion, no tenderness  Neurological - cranial nerves II through XII grossly intact, no focal deficit  Musculoskeletal - no muscular tenderness noted, normal strength  Extremities - peripheral pulses normal, no pedal edema  Skin - normal coloration  no rashes    Recent Labs:  Lab Results   Component Value Date/Time    Cholesterol, total 172 12/19/2016 07:20 AM    HDL Cholesterol 36 (L) 12/19/2016 07:20 AM    LDL, calculated 101 (H) 12/19/2016 07:20 AM    Triglyceride 175 (H) 12/19/2016 07:20 AM     Lab Results   Component Value Date/Time    Creatinine 0.91 12/19/2016 07:20 AM     Lab Results   Component Value Date/Time    BUN 11 12/19/2016 07:20 AM     Lab Results   Component Value Date/Time    Potassium  4.2 12/19/2016 07:20 AM     Lab Results   Component Value Date/Time    Hemoglobin A1c 5.3 12/19/2016 07:20 AM     Lab Results   Component Value Date/Time    HGB 13.1 12/19/2016 07:20 AM     Lab Results   Component Value Date/Time    PLATELET 233 12/19/2016 07:20 AM       Reviewed:  Past Medical History:   Diagnosis Date   ??? Arthritis    ??? 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   ??? Psoriatic arthritis (HCC)     Dr. Towanda Malkin   ??? Right lateral epicondylitis      History   Smoking Status   ??? Former Smoker   Smokeless Tobacco   ??? Never Used     History   Alcohol Use   ??? 0.0 oz/week   ??? 0 Standard drinks or equivalent per week     Comment: Social     No Known Allergies    Current Outpatient Prescriptions   Medication Sig    ??? secukinumab (COSENTYX PEN, 2 PENS,) 150 mg/mL pnij by SubCUTAneous route every four (4) weeks.   ??? fluticasone (FLONASE) 50 mcg/actuation nasal spray 2 Sprays by Both Nostrils route as needed.   ??? loratadine (CLARITIN) 10 mg tablet Take 10 mg by mouth daily.   ??? azithromycin (ZITHROMAX) 250 mg tablet Take 2 tablets PO once, then take 1 tablet PO daily for 4 additional daily doses, as needed for diarrhea during travel.   ??? atovaquone-proguanil (MALARONE) 250-100 mg per tablet Take 1 Tab by mouth daily.   ??? adalimumab (HUMIRA PEN) 40 mg/0.8 mL injection pen 0.8 mL by SubCUTAneous route every fourteen (14) days.   ??? ondansetron hcl (ZOFRAN, AS HYDROCHLORIDE,) 4 mg tablet Take 1 Tab by mouth every eight (8) hours as needed for Nausea. (Patient not taking: Reported on 06/03/2016)   ??? valACYclovir (VALTREX) 1 gram tablet Take 1 Tab by mouth daily. (Patient not taking: Reported on 06/03/2016)   ??? lidocaine (LIDODERM) 5 % 1 Patch by TransDERmal route every twenty-four (24) hours. Apply patch to the affected area for 12 hours a day and remove for 12 hours a day. (Patient not taking: Reported on 06/03/2016)   ??? gabapentin (NEURONTIN) 300 mg capsule Take 1 Cap by mouth three (3) times daily. (Patient not taking: Reported on 02/19/2016)   ??? etanercept (ENBREL) 50 mg/mL (0.98 mL) injection 50 mg by SubCUTAneous route every seven (7) days. (Patient not taking: Reported on 06/03/2016)     No current facility-administered medications for this visit.        Marcellus Scott, MD  [facility]  [address]  [place], [place] [address]

## 2017-03-10 ENCOUNTER — Encounter: Attending: Internal Medicine | Primary: Internal Medicine

## 2017-03-10 ENCOUNTER — Institutional Professional Consult (permissible substitution): Admit: 2017-03-10 | Payer: PRIVATE HEALTH INSURANCE | Primary: Internal Medicine

## 2017-03-10 DIAGNOSIS — Z8249 Family history of ischemic heart disease and other diseases of the circulatory system: Secondary | ICD-10-CM

## 2017-03-11 ENCOUNTER — Institutional Professional Consult (permissible substitution)
Admit: 2017-03-11 | Discharge: 2017-03-11 | Payer: PRIVATE HEALTH INSURANCE | Attending: Internal Medicine | Primary: Internal Medicine

## 2017-03-11 DIAGNOSIS — Z23 Encounter for immunization: Secondary | ICD-10-CM

## 2017-03-11 NOTE — Progress Notes (Signed)
Scheduled for Shingrix vaccine #1.      Diagnoses and all orders for this visit:    1. Encounter for immunization  -     Zoster VACC Recominant Adjuvanted (Shingrix)    He should return for repeat dose in 2-3539mo.

## 2017-03-17 ENCOUNTER — Ambulatory Visit: Admit: 2017-03-17 | Payer: PRIVATE HEALTH INSURANCE | Attending: Rheumatology | Primary: Internal Medicine

## 2017-03-17 DIAGNOSIS — L4050 Arthropathic psoriasis, unspecified: Secondary | ICD-10-CM

## 2017-03-17 LAB — ECHOCARDIOGRAM COMPLETE 2D W DOPPLER W COLOR: Left Ventricular Ejection Fraction: 63

## 2017-03-17 MED ORDER — CLOBETASOL 0.05 % TOPICAL CREAM
0.05 % | Freq: Two times a day (BID) | CUTANEOUS | 0 refills | Status: AC
Start: 2017-03-17 — End: 2017-04-16

## 2017-03-17 NOTE — Progress Notes (Signed)
RHEUMATOLOGY PROBLEM LIST AND CHIEF COMPLAINT1. Psoriatic arthritis - history of psoriasis, arthralgia, arthritis, and achilles enthesitis since 1998, diagnosed with PsA in 2002. Previously treated with methotrexate and Enbrel.    Therapy History:  Previous DMARDs: Enbrel, Humira (04/2016 - 08/2016; failed)   Current DMARDs: Cosentyx (10/2016 - current)    INTERVAL HISTORY  This is a 61 y.o. Asian male.  Today, the patient complains of pain in the joints.  Location: right wrist  Severity:  2 on a scale of 0-10  Timing: all day   Duration:  2 months  Context/Associated signs and symptoms: The patient does have post inflammatory hyperpigmentation on his anterior lower legs. He was increased to Cosentyx 300 mg after his last visit which he has responded well to this. He has right wrist pain, but no other joint pain today. It does not bother him greatly and he can manage the pain. He went to [place] after his last visit and did well. He mentions previous improvement in joint pain with celebrex, which he has leftover tablets of at home.    RHEUMATOLOGY REVIEW OF SYSTEMS   Positives as per history  Negatives as follows:  CONSTITUTlONAL:  Denies unexplained persistent fevers or weight change  RESPIRATORY:  No pleuritic pain, exertional dyspnea  CARDIOVASCULAR:  Denies chest pain  GASTRO:   Denies heartburn, abdominal pain, nausea, vomiting, diarrhea  SKIN:    See above  MSK:    No morning stiffness >1 hour, persistent joint swelling, persistent joint pain    PAST MEDICAL HISTORY  Reviewed with patient, significant changes in medical history - no    FAMILY HISTORY  psoriasis - Uncle  MEDICATIONS  All the current medications were reviewed in detail.      PHYSICAL EXAM  Blood pressure 137/84, pulse 68, temperature 97.9 ??F (36.6 ??C), temperature source Oral, resp. rate 16, weight 157 lb 9.6 oz (71.5 kg), SpO2 97 %.  GENERAL APPEARANCE: Well-nourished adult in no acute distress.  EYES: No scleral erythema, conjunctival injection.   ENT: No oral ulcer, parotid enlargement.  NECK: No adenopathy, thyroid enlargement.  CARDIOVASCULAR: Heart rhythm is regular. No murmur, rub, gallop.  CHEST: Normal vesicular breath sounds. No wheezes, rales, pleural friction rubs.  ABDOMINAL: The abdomen is soft and nontender. Liver and spleen are nonpalpable. Bowel sounds are normal.  EXTREMITIES: There is no evidence of clubbing, cyanosis, edema.  SKIN: Only active lesion is on his left medial malleolus and left shin. Psoriatic plaques and active lesion over shins covering <1% BSA,  NEUROLOGICAL: Normal gait and station, full strength in upper and lower extremities, normal sensation to light touch.  MUSCULOSKELETAL:   Upper extremities -right wrist subtle warmth, full range of motion, no tenderness, no swelling, no synovial thickening and no deformity of joints.  Lower extremities - full range of motion, no tenderness, no swelling, no synovial thickening and no deformity of joints.    LABS, RADIOLOGY AND PROCEDURES   Previous labs reviewed -Yes    ASSESSMENT1. Psoriatic arthritis - (Established problem - Partial response) - The patient's psoriasis has improved on exam from <1% BSA. However, his psoriasis over his left ankle has improved after the increase of Cosentyx to 300 mg monthly. Will continue this dosage. His only current joint pain is in his right wrist. For now, he should take Celebrex 400 mg BID for this. He should return in 3 months for a follow up.  We discussed DMARDs to add to biologic for joint relief.  2. Drug  therapy monitoring for toxicity (biologic) - CBC, BUN, Cr, AST, ALT and albumin every 4 months  PLAN  1. Continue Cosentyx to 300 mg monthly  2. Celebrex for joint pain  3. Return in 3 months    Camiyah Friberg M. Allena KatzPatel, MD  Adult and Pediatric Rheumatology     [facility]  [address], [place], [place] [id number], Phone [phone], Fax [phone]   E-mail: [email or web address]     Visiting Assistant Professor of Pediatrics    Department of Pediatrics, [facility]   [id number], [place], [place] [id number], Phone [phone], Fax [phone]  E-mail: [email or web address]    There are no Patient Instructions on file for this visit.    cc:  Donzetta Matterslarence G Childress, MD    Written by Janifer AdieJeremy DiGennaro, scribe, as dictated by Neomia DearAarat M. Allena KatzPatel, M.D.

## 2017-04-03 ENCOUNTER — Encounter

## 2017-04-08 ENCOUNTER — Inpatient Hospital Stay: Admit: 2017-04-08 | Payer: Self-pay | Primary: Internal Medicine

## 2017-04-08 DIAGNOSIS — Z136 Encounter for screening for cardiovascular disorders: Secondary | ICD-10-CM

## 2017-04-08 NOTE — Progress Notes (Signed)
Very mild cad. Still recommend aspirin and statin. I will discuss with him.

## 2017-10-18 ENCOUNTER — Encounter

## 2017-10-19 NOTE — Telephone Encounter (Signed)
Please review with pt:    --He has taken Malarone for travel to [place].  Please clarify duration of travel, and can refill.    He would need 8 tabs more than number of days in malaria area of country to which traveling.    --We have not seen him for travel visit since done with physical Jan 2018.  Please have him schedule travel visit if possible prior to travel.    --He would be due for updated typhoid vaccine, and may also need azithromycin for travel.

## 2017-10-19 NOTE — Telephone Encounter (Signed)
Pt last seen for well visit 11/2016, no future appts. Please advise, thank you

## 2017-10-20 ENCOUNTER — Ambulatory Visit: Admit: 2017-10-20 | Payer: PRIVATE HEALTH INSURANCE | Attending: Rheumatology | Primary: Internal Medicine

## 2017-10-20 ENCOUNTER — Encounter
Admit: 2017-10-20 | Discharge: 2017-10-20 | Payer: PRIVATE HEALTH INSURANCE | Attending: Family Medicine | Primary: Internal Medicine

## 2017-10-20 DIAGNOSIS — L4050 Arthropathic psoriasis, unspecified: Secondary | ICD-10-CM

## 2017-10-20 MED ORDER — CLOBETASOL 0.05 % TOPICAL CREAM
0.05 % | Freq: Two times a day (BID) | CUTANEOUS | 6 refills | Status: AC
Start: 2017-10-20 — End: 2017-11-19

## 2017-10-20 MED ORDER — SECUKINUMAB 150 MG/ML SUBCUTANEOUS PEN INJECTOR
150 mg/mL | INJECTION | SUBCUTANEOUS | 0 refills | Status: AC
Start: 2017-10-20 — End: 2017-11-18

## 2017-10-20 NOTE — Progress Notes (Signed)
RHEUMATOLOGY PROBLEM LIST AND CHIEF COMPLAINT1. Psoriatic arthritis - history of psoriasis, arthralgia, arthritis, and achilles enthesitis since 1998, diagnosed with PsA in 2002. Previously treated with methotrexate and Enbrel.    Therapy History:  Previous DMARDs: Enbrel, Humira (04/2016 - 08/2016; failed)   Current DMARDs: Cosentyx (10/2016 - current)    INTERVAL HISTORY  This is a 61 y.o. Asian male.  Today, the patient complains of pain in the joints.  Location: right wrist  Severity:  2 on a scale of 0-10  Timing: all day   Duration:  7 months  Context/Associated signs and symptoms: The patient is doing well today. His worst complaint today is pain along the base of his right wrist that usually worsens with activity. He continues with Cosentyx 300 mg monthly, Clobetasol for rash.    RHEUMATOLOGY REVIEW OF SYSTEMS   Positives as per history  Negatives as follows:  CONSTITUTlONAL:  Denies unexplained persistent fevers or weight change  RESPIRATORY:  No pleuritic pain, exertional dyspnea  CARDIOVASCULAR:  Denies chest pain  GASTRO:   Denies heartburn, abdominal pain, nausea, vomiting, diarrhea  SKIN:    See above  MSK:    No morning stiffness >1 hour, persistent joint swelling, persistent joint pain    PAST MEDICAL HISTORY  Reviewed with patient, significant changes in medical history - no    FAMILY HISTORY  psoriasis - Uncle  MEDICATIONS  All the current medications were reviewed in detail.      PHYSICAL EXAM  Blood pressure 112/73, pulse 74, temperature 98.4 ??F (36.9 ??C), temperature source Oral, resp. rate 16, weight 147 lb (66.7 kg), SpO2 97 %.  GENERAL APPEARANCE: Well-nourished adult in no acute distress.  EYES: No scleral erythema, conjunctival injection.  ENT: No oral ulcer, parotid enlargement.  NECK: No adenopathy, thyroid enlargement.  CARDIOVASCULAR: Heart rhythm is regular. No murmur, rub, gallop.  CHEST: Normal vesicular breath sounds. No wheezes, rales, pleural friction rubs.   ABDOMINAL: The abdomen is soft and nontender. Liver and spleen are nonpalpable. Bowel sounds are normal.  EXTREMITIES: There is no evidence of clubbing, cyanosis, edema.  SKIN: Only active lesion is on his left medial malleolus and left shin. Psoriatic plaques and active lesion over shins covering <1% BSA,  NEUROLOGICAL: Normal gait and station, full strength in upper and lower extremities, normal sensation to light touch.  MUSCULOSKELETAL:   Upper extremities - full range of motion, no tenderness, no swelling, no synovial thickening and no deformity of joints.  Lower extremities - full range of motion, no tenderness, no swelling, no synovial thickening and no deformity of joints.    LABS, RADIOLOGY AND PROCEDURES   Previous labs reviewed -Yes    ASSESSMENT1. Psoriatic arthritis - (Established problem - Stable disease) - The patient's disease is stable on his medication Cosentyx 300 mg monthly. I will start him on Clobetasol BID. Which has helped with his rash. He should continue to monitor his wrist symptoms. I will check labs today. He should return in 6 months.  2. Drug therapy monitoring for toxicity (biologic) - CBC, BUN, Cr, AST, ALT and albumin every 4 months  PLAN  1. Continue Cosentyx 300 mg monthly  2. Celebrex for joint pain  3. Start Clobetasol BID  4. Check CBC, CMP, TB  5. Return in 6 months    Izen Petz M. Allena KatzPatel, MD  Adult and Pediatric Rheumatology     [facility]  [address], [place], [place] [address], Phone [phone], Fax [phone]   E-mail:  [email or web address]    Visiting Assistant Professor of Pediatrics    Department of Pediatrics, [facility]   [phone], [place], [place] [id number], Phone [phone], Fax [phone]  E-mail: [email or web address]    There are no Patient Instructions on file for this visit.    cc:  Donzetta Mattershildress, Clarence G, MD    Written by Carmie Endohan Sharma, scribe, as dictated by Neomia DearAarat M. Allena KatzPatel, M.D.

## 2017-10-21 ENCOUNTER — Institutional Professional Consult (permissible substitution)
Admit: 2017-10-21 | Discharge: 2017-10-22 | Payer: PRIVATE HEALTH INSURANCE | Attending: Internal Medicine | Primary: Internal Medicine

## 2017-10-21 DIAGNOSIS — Z23 Encounter for immunization: Secondary | ICD-10-CM

## 2017-10-21 MED ORDER — ATOVAQUONE-PROGUANIL 250 MG-100 MG TAB
250-100 mg | ORAL_TABLET | Freq: Every day | ORAL | 0 refills | Status: DC
Start: 2017-10-21 — End: 2018-09-20

## 2017-10-21 MED ORDER — AZITHROMYCIN 250 MG TAB
250 mg | ORAL_TABLET | ORAL | 0 refills | Status: DC
Start: 2017-10-21 — End: 2018-01-01

## 2017-10-21 NOTE — Telephone Encounter (Signed)
Malarone refilled as requested for malaria prevention.    Azithromycin also sent to pharmacy, in case needed for travel as well.  He used with travel prior with Jan 2018 visit.    Called pt and he also needed azithromycin as sent in.    He can come today at ~4PM for injectable typhoid vaccine.    Please add him to nurse schedule.

## 2017-10-21 NOTE — Progress Notes (Signed)
RM 17    Chief Complaint   Patient presents with   ??? Immunization/Injection     Patient traveling to [place] 10/31/17 returning 11/16/17       Immunization/s administered to left deltoid 10/21/2017 by Zollie ScaleUnique Jazlin Tapscott, LPN with patient's consent.    Patient tolerated procedure well.  No reactions noted.  VIS provided.

## 2017-10-21 NOTE — Progress Notes (Signed)
Added to schedule to receive IM typhoid vaccine.    He is traveling and needs updated.    IM injection (not live oral) due to use immunosuppressive meds.    Diagnoses and all orders for this visit:    1. Encounter for immunization  -     TYPHOID VACCINE, VI CAPSULAR POLYSACCHARIDE (VICPS), IM      Addendum:  Pt questions clarified with nursing regarding timing of PPSV-23.  He had PCV-13 February 2017.  Reviewed if covered with insurance, can get any time.  He will plan to do at another follow-up here, ~7176yr from PCV-13.

## 2017-10-21 NOTE — Telephone Encounter (Signed)
Pt comment--included with message below:    Patient comment: Dr. Kateri Mchidress, ??I will be spending 16 days in [place] from Dec 31st and would like to take malaria prophylaxis.The last prescription allows me only 10 tabs as refill, which will be inadequate considering I need to take for 7 days after coming back (I need a total of 25 tabs of Malarone at least). ??I would appreciate if you can send a prescription for Malarone (generic is fine too) [facility]. ??Thank you very much. ??Noralee Spaceavi Bley

## 2017-10-21 NOTE — Telephone Encounter (Signed)
Dr Childress/refill   Received: Today   Message Contents   Anthony Short, Anthony Short  P [employer]      ??      Pt needs a refill on Atovaquone proguanil need 25 tablets, [facility], ??going out of town on 10-31-2017, if you have any questions please call [phone].

## 2017-10-22 LAB — CBC+PLATELET+HEM REVIEW
ABS. BASOPHILS: 0 10*3/uL (ref 0.0–0.2)
ABS. EOSINOPHILS: 0.5 10*3/uL — ABNORMAL HIGH (ref 0.0–0.4)
ABS. LYMPHOCYTES: 2.4 10*3/uL (ref 0.7–3.1)
ABS. MONOCYTES: 0.5 10*3/uL (ref 0.1–0.9)
ABS. NEUTROPHILS: 2.8 10*3/uL (ref 1.4–7.0)
BASOPHILS: 0 %
EOSINOPHILS: 8 %
HCT: 39.2 % (ref 37.5–51.0)
HGB: 13.5 g/dL (ref 13.0–17.7)
LYMPHOCYTES: 39 %
MCH: 31.5 pg (ref 26.6–33.0)
MCHC: 34.4 g/dL (ref 31.5–35.7)
MCV: 92 fL (ref 79–97)
MONOCYTES: 8 %
NEUTROPHILS: 45 %
PLATELET COMMENT: ADEQUATE
PLATELET: 264 10*3/uL (ref 150–379)
RBC COMMENT: NORMAL
RBC: 4.28 x10E6/uL (ref 4.14–5.80)
RDW: 13.2 % (ref 12.3–15.4)
WBC: 6.2 10*3/uL (ref 3.4–10.8)

## 2017-10-22 LAB — METABOLIC PANEL, COMPREHENSIVE
A-G Ratio: 1.4 (ref 1.2–2.2)
ALT (SGPT): 14 IU/L (ref 0–44)
AST (SGOT): 17 IU/L (ref 0–40)
Albumin: 4.2 g/dL (ref 3.6–4.8)
Alk. phosphatase: 63 IU/L (ref 39–117)
BUN/Creatinine ratio: 15 (ref 10–24)
BUN: 12 mg/dL (ref 8–27)
Bilirubin, total: <0.2 mg/dL (ref 0.0–1.2)
CO2: 28 mmol/L (ref 20–29)
Calcium: 9.2 mg/dL (ref 8.6–10.2)
Chloride: 104 mmol/L (ref 96–106)
Creatinine: 0.82 mg/dL (ref 0.76–1.27)
GFR est AA: 111 mL/min/{1.73_m2} (ref >59)
GFR est non-AA: 96 mL/min/{1.73_m2} (ref >59)
GLOBULIN, TOTAL: 3.1 g/dL (ref 1.5–4.5)
Glucose: 87 mg/dL (ref 65–99)
Potassium: 4.6 mmol/L (ref 3.5–5.2)
Protein, total: 7.3 g/dL (ref 6.0–8.5)
Sodium: 143 mmol/L (ref 134–144)

## 2017-10-23 LAB — QUANTIFERON-TB GOLD PLUS
QuantiFERON Mitogen Value: >10 IU/mL
QuantiFERON Nil Value: 0.02 IU/mL
QuantiFERON Plus: NEGATIVE
QuantiFERON TB1 Ag: 0.05 IU/mL
QuantiFERON TB2 Ag: 0.02 IU/mL

## 2017-11-27 ENCOUNTER — Telehealth

## 2017-11-27 NOTE — Telephone Encounter (Signed)
Verified patient with two types of identifiers. Notified patient and order placed.  PSR scheduled stress echo.  Prep given.  Patient verbalizes understanding. And will call with any questions or concerns.

## 2017-11-27 NOTE — Telephone Encounter (Signed)
VO per Dr Dahlia ClientBrowning needs stress echo.

## 2017-11-27 NOTE — Telephone Encounter (Signed)
Verified patient with two types of identifiers. States that while he was working out yesterday and last week he got tightness in the chest and wanted to know what he should do.  He was not sure if he needed a stress test.  He states that when Dr Dahlia ClientBrowning returns the call if she could leave a message of where to call her back he is seeing patients today.

## 2017-11-27 NOTE — Telephone Encounter (Signed)
Needs stress echo.  Has CAD

## 2017-12-21 ENCOUNTER — Encounter: Primary: Internal Medicine

## 2017-12-24 ENCOUNTER — Encounter

## 2017-12-25 ENCOUNTER — Ambulatory Visit: Primary: Internal Medicine

## 2017-12-29 ENCOUNTER — Ambulatory Visit: Payer: PRIVATE HEALTH INSURANCE | Primary: Internal Medicine

## 2017-12-31 ENCOUNTER — Ambulatory Visit

## 2017-12-31 ENCOUNTER — Encounter

## 2017-12-31 ENCOUNTER — Ambulatory Visit: Admit: 2017-12-31 | Discharge: 2017-12-31 | Payer: PRIVATE HEALTH INSURANCE | Primary: Internal Medicine

## 2017-12-31 DIAGNOSIS — R079 Chest pain, unspecified: Secondary | ICD-10-CM

## 2017-12-31 LAB — ECHO STRESS
Angina Index: 2
Baseline Diastolic BP: 86 mmHg
Baseline HR: 74 {beats}/min
Baseline O2 Sat: 98 %
Baseline Systolic BP: 134 mmHg
Duke Treadmill Score: -18
Exercise Duration Time: 5:8 {titer}
Stress Diastolic BP: 84 mmHg
Stress Estimated Workload: 7 METS
Stress Peak HR: 141 {beats}/min
Stress Percent HR Achieved: 104 %
Stress Rate Pressure Product: 19740 bpm*mmHg
Stress ST Depression: 3 mm
Stress Systolic BP: 140 mmHg
Stress Target HR: 135 {beats}/min

## 2017-12-31 MED ORDER — ROSUVASTATIN 20 MG TAB
20 mg | ORAL_TABLET | Freq: Every evening | ORAL | 3 refills | Status: DC
Start: 2017-12-31 — End: 2018-01-01

## 2017-12-31 MED ORDER — PERFLUTREN LIPID MICROSPHERES 1.1 MG/ML IV
1.1 mg/mL | Freq: Once | INTRAVENOUS | Status: AC
Start: 2017-12-31 — End: 2017-12-31
  Administered 2017-12-31: 20:00:00 via INTRAVENOUS

## 2017-12-31 MED ORDER — NITROGLYCERIN 0.4 MG SUBLINGUAL TAB
0.4 mg | SUBLINGUAL | 11 refills | Status: AC | PRN
Start: 2017-12-31 — End: ?

## 2017-12-31 NOTE — Progress Notes (Signed)
Referred for urgent cath.

## 2018-01-01 ENCOUNTER — Encounter: Primary: Internal Medicine

## 2018-01-01 ENCOUNTER — Inpatient Hospital Stay
Admit: 2018-01-01 | Discharge: 2018-01-01 | Disposition: A | Discharge status: Other Acute Inpatient Hospital | Payer: PRIVATE HEALTH INSURANCE | Attending: Internal Medicine | Admitting: Internal Medicine

## 2018-01-01 DIAGNOSIS — I25119 Atherosclerotic heart disease of native coronary artery with unspecified angina pectoris: Secondary | ICD-10-CM

## 2018-01-01 LAB — STRESS ECHOCARDIOGRAM EXERCISE (CONTRAST/BUBBLE PRN)
Angina Index: 2
Baseline Diastolic BP: 86 mmHg
Baseline HR: 74 {beats}/min
Baseline O2 Sat: 98 %
Baseline Systolic BP: 134 mmHg
Duke Treadmill Score: -18
Exercise Duration Time: 5:8 {titer}
Stress Diastolic BP: 84 mmHg
Stress Estimated Workload: 7 METS
Stress Peak HR: 141 {beats}/min
Stress Percent HR Achieved: 104 %
Stress Rate Pressure Product: 19740 bpm*mmHg
Stress ST Depression: 3 mm
Stress Systolic BP: 140 mmHg
Stress Target HR: 135 {beats}/min

## 2018-01-01 LAB — CBC W/O DIFF
ABSOLUTE NRBC: 0 10*3/uL (ref 0.00–0.01)
HCT: 41 % (ref 36.6–50.3)
HGB: 13.3 g/dL (ref 12.1–17.0)
MCH: 30.4 PG (ref 26.0–34.0)
MCHC: 32.4 g/dL (ref 30.0–36.5)
MCV: 93.8 FL (ref 80.0–99.0)
MPV: 10.5 FL (ref 8.9–12.9)
NRBC: 0 PER 100 WBC
PLATELET: 243 10*3/uL (ref 150–400)
RBC: 4.37 M/uL (ref 4.10–5.70)
RDW: 12.5 % (ref 11.5–14.5)
WBC: 7.4 10*3/uL (ref 4.1–11.1)

## 2018-01-01 LAB — METABOLIC PANEL, COMPREHENSIVE
A-G Ratio: 1 — ABNORMAL LOW (ref 1.1–2.2)
ALT (SGPT): 21 U/L (ref 12–78)
AST (SGOT): 19 U/L (ref 15–37)
Albumin: 3.9 g/dL (ref 3.5–5.0)
Alk. phosphatase: 66 U/L (ref 45–117)
Anion gap: 8 mmol/L (ref 5–15)
BUN/Creatinine ratio: 17 (ref 12–20)
BUN: 14 MG/DL (ref 6–20)
Bilirubin, total: 0.4 MG/DL (ref 0.2–1.0)
CO2: 27 mmol/L (ref 21–32)
Calcium: 8.7 MG/DL (ref 8.5–10.1)
Chloride: 105 mmol/L (ref 97–108)
Creatinine: 0.82 MG/DL (ref 0.70–1.30)
GFR est AA: >60 mL/min/{1.73_m2} (ref >60)
GFR est non-AA: >60 mL/min/{1.73_m2} (ref >60)
Globulin: 3.8 g/dL (ref 2.0–4.0)
Glucose: 86 mg/dL (ref 65–100)
Potassium: 4 mmol/L (ref 3.5–5.1)
Protein, total: 7.7 g/dL (ref 6.4–8.2)
Sodium: 140 mmol/L (ref 136–145)

## 2018-01-01 LAB — POC INR: INR (POC): 1 (ref <1.2)

## 2018-01-01 MED ORDER — IOPAMIDOL 76 % IV SOLN
370 mg iodine /mL (76 %) | INTRAVENOUS | Status: DC | PRN
Start: 2018-01-01 — End: 2018-01-01
  Administered 2018-01-01: 17:00:00 via INTRA_ARTERIAL

## 2018-01-01 MED ORDER — HEPARIN (PORCINE) 1,000 UNIT/ML IJ SOLN
1000 unit/mL | INTRAMUSCULAR | Status: AC
Start: 2018-01-01 — End: ?

## 2018-01-01 MED ORDER — LIDOCAINE HCL 1 % (10 MG/ML) IJ SOLN
10 mg/mL (1 %) | INTRAMUSCULAR | Status: DC | PRN
Start: 2018-01-01 — End: 2018-01-01
  Administered 2018-01-01: 16:00:00 via SUBCUTANEOUS

## 2018-01-01 MED ORDER — SODIUM CHLORIDE 0.9 % IJ SYRG
Freq: Three times a day (TID) | INTRAMUSCULAR | Status: DC
Start: 2018-01-01 — End: 2018-01-01

## 2018-01-01 MED ORDER — ACETAMINOPHEN 325 MG TABLET
325 mg | ORAL | Status: DC | PRN
Start: 2018-01-01 — End: 2018-01-01

## 2018-01-01 MED ORDER — HEPARIN (PORCINE) 1,000 UNIT/ML IJ SOLN
1000 unit/mL | INTRAMUSCULAR | Status: DC | PRN
Start: 2018-01-01 — End: 2018-01-01
  Administered 2018-01-01: 17:00:00 via INTRAVENOUS

## 2018-01-01 MED ORDER — ASPIRIN 325 MG TAB
325 mg | Freq: Every day | ORAL | Status: DC
Start: 2018-01-01 — End: 2018-01-01
  Administered 2018-01-01: 16:00:00 via ORAL

## 2018-01-01 MED ORDER — SODIUM CHLORIDE 0.9 % IV
INTRAVENOUS | Status: DC
Start: 2018-01-01 — End: 2018-01-01

## 2018-01-01 MED ORDER — FENTANYL CITRATE (PF) 50 MCG/ML IJ SOLN
50 mcg/mL | INTRAMUSCULAR | Status: DC | PRN
Start: 2018-01-01 — End: 2018-01-01
  Administered 2018-01-01 (×2): via INTRAVENOUS

## 2018-01-01 MED ORDER — SODIUM CHLORIDE 0.9 % IV
INTRAVENOUS | 0 refills | Status: DC
Start: 2018-01-01 — End: 2018-04-15

## 2018-01-01 MED ORDER — NITROGLYCERIN 0.1 MG/ML (100 MCG/ML) D5W COMPOUNDED INJECTION
0.1 mg/mL | INTRAVENOUS | Status: AC
Start: 2018-01-01 — End: ?

## 2018-01-01 MED ORDER — SODIUM CHLORIDE 0.9 % IJ SYRG
INTRAMUSCULAR | Status: DC | PRN
Start: 2018-01-01 — End: 2018-01-01

## 2018-01-01 MED ORDER — ATORVASTATIN 20 MG TAB
20 mg | Freq: Every evening | ORAL | Status: DC
Start: 2018-01-01 — End: 2018-01-01

## 2018-01-01 MED ORDER — IOPAMIDOL 76 % IV SOLN
370 mg iodine /mL (76 %) | INTRAVENOUS | Status: AC
Start: 2018-01-01 — End: ?

## 2018-01-01 MED ORDER — VERAPAMIL 2.5 MG/ML IV
2.5 mg/mL | INTRAVENOUS | Status: AC
Start: 2018-01-01 — End: ?

## 2018-01-01 MED ORDER — HEPARIN (PORCINE) IN NS (PF) 1,000 UNIT/500 ML IV
1000 unit/500 mL | INTRAVENOUS | Status: AC | PRN
Start: 2018-01-01 — End: 2018-01-01
  Administered 2018-01-01: 16:00:00 via INTRA_ARTERIAL
  Administered 2018-01-01: 16:00:00

## 2018-01-01 MED ORDER — HEPARIN (PORCINE) IN D5W 25,000 UNIT/250 ML IV
25000 unit/250 mL(100 unit/mL) | INTRAVENOUS | Status: AC | PRN
Start: 2018-01-01 — End: 2018-01-01
  Administered 2018-01-01: 17:00:00 via INTRAVENOUS

## 2018-01-01 MED ORDER — MIDAZOLAM 1 MG/ML IJ SOLN
1 mg/mL | INTRAMUSCULAR | Status: DC | PRN
Start: 2018-01-01 — End: 2018-01-01
  Administered 2018-01-01 (×3): via INTRAVENOUS

## 2018-01-01 MED ORDER — HEPARIN (PORCINE) IN NS (PF) 1,000 UNIT/500 ML IV
1000 unit/500 mL | INTRAVENOUS | Status: AC
Start: 2018-01-01 — End: ?

## 2018-01-01 MED ORDER — ATORVASTATIN 20 MG TAB
20 mg | ORAL_TABLET | Freq: Every evening | ORAL | 0 refills | Status: AC
Start: 2018-01-01 — End: ?

## 2018-01-01 MED ORDER — FENTANYL CITRATE (PF) 50 MCG/ML IJ SOLN
50 mcg/mL | INTRAMUSCULAR | Status: AC
Start: 2018-01-01 — End: ?

## 2018-01-01 MED ORDER — HEPARIN (PORCINE) IN D5W 25,000 UNIT/250 ML IV
25000 unit/250 mL(100 unit/mL) | INTRAVENOUS | Status: AC
Start: 2018-01-01 — End: ?

## 2018-01-01 MED ORDER — MIDAZOLAM 1 MG/ML IJ SOLN
1 mg/mL | INTRAMUSCULAR | Status: AC
Start: 2018-01-01 — End: ?

## 2018-01-01 MED FILL — FENTANYL CITRATE (PF) 50 MCG/ML IJ SOLN: 50 mcg/mL | INTRAMUSCULAR | Qty: 2

## 2018-01-01 MED FILL — MIDAZOLAM 1 MG/ML IJ SOLN: 1 mg/mL | INTRAMUSCULAR | Qty: 5

## 2018-01-01 MED FILL — NITROGLYCERIN 0.1 MG/ML (100 MCG/ML) D5W COMPOUNDED INJECTION: 0.1 mg/mL | INTRAVENOUS | Qty: 10

## 2018-01-01 MED FILL — ISOVUE-370  76 % INTRAVENOUS SOLUTION: 370 mg iodine /mL (76 %) | INTRAVENOUS | Qty: 200

## 2018-01-01 MED FILL — SODIUM CHLORIDE 0.9 % IV: INTRAVENOUS | Qty: 250

## 2018-01-01 MED FILL — BD POSIFLUSH NORMAL SALINE 0.9 % INJECTION SYRINGE: INTRAMUSCULAR | Qty: 40

## 2018-01-01 MED FILL — VERAPAMIL 2.5 MG/ML IV: 2.5 mg/mL | INTRAVENOUS | Qty: 2

## 2018-01-01 MED FILL — ASPIRIN 325 MG TAB: 325 mg | ORAL | Qty: 1

## 2018-01-01 MED FILL — HEPARIN (PORCINE) IN D5W 25,000 UNIT/250 ML IV: 25000 unit/250 mL(100 unit/mL) | INTRAVENOUS | Qty: 250

## 2018-01-01 MED FILL — HEPARIN (PORCINE) IN NS (PF) 1,000 UNIT/500 ML IV: 1000 unit/500 mL | INTRAVENOUS | Qty: 500

## 2018-01-01 MED FILL — HEPARIN (PORCINE) 1,000 UNIT/ML IJ SOLN: 1000 unit/mL | INTRAMUSCULAR | Qty: 10

## 2018-01-01 NOTE — H&P (Signed)
Lilli Few, MD    [address] 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 Retrosternal chest tightness usually with exertion/relieved with stress/mild diaphoresis/dyspnea. His cardiologist Dr Dahlia Client ordered a stress test which was postive - high risk stress test. Here for cardiac cath. Hx of abd CT recently - no AAA    Past Medical History:   Diagnosis Date   ??? Arthritis    ??? 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   ??? Psoriatic arthritis (HCC)     Dr. Towanda Malkin   ??? Right lateral epicondylitis       Past Surgical History:   Procedure Laterality Date   ??? COLONOSCOPY N/A 03/20/2015    COLONOSCOPY performed by Wilfred Curtis, MD at [facility] ENDOSCOPY   ??? HX HERNIA REPAIR     ??? HX THORACOTOMY - hx of mediastinal lymphadenopathy/TB - treated       No Known Allergies  Family History   Problem Relation Age of Onset   ??? Heart Disease Father       Social History     Tobacco Use   ??? Smoking status: Former Smoker   ??? Smokeless tobacco: Never Used   Substance Use Topics   ??? Alcohol use: Yes     Alcohol/week: 0.0 oz     Comment: Social   ??? Drug use: No       Primary care physician:  [name], MD     Medications before admission:  This SmartLink is deprecated. Use AVSMEDLIST instead to display the medication list for a patient.    No current facility-administered medications for this encounter.                Review of Systems:  (bold if positive, if negative)    As in HPI        Physical Exam:  VS - as recorded     Telemetry:     Gen: Well-developed, well-nourished, in no acute distress  HEENT:  Pink conjunctivae, hearing intact to voice, moist mucous membranes  Neck: Supple,No JVD, No Carotid Bruit, Thyroid- non tender  Resp: No accessory muscle use, Clear breath sounds, No rales or rhonchi  Card: Regular Rate,Rythm,Normal S1, S2, No murmurs, rubs or gallop. No thrills.   Abd:  Soft, non-tender, non-distended, normoactive bowel sounds are present,   MSK: No cyanosis or clubbing  Skin: No rashes or ulcers  Neuro:   moving all four extremities, no focal deficit, follows commands appropriately  Psych:  Good insight, oriented to person, place and time, alert, Nml Affect  LE: No edema  Vascular:Radial Pulses 2+ and symmetric  EKG:       Cardiac Testing/ Procedures:    A.Cardiac Cath/PCI:    B.ECHO/TEE:    C.StressNuclear/Stress ECHO/Stress test:       D.Vascular:    E. EP:    F. Miscellaneous:    Cxray:    LABS:    No results for input(s): CPK, TROIQ in the last 72 hours.    No lab exists for component: CKQMB, CPKMB    Lab Results   Component Value Date/Time    WBC 6.2 10/20/2017 03:32 PM    HGB 13.5 10/20/2017 03:32 PM    HCT 39.2 10/20/2017 03:32 PM    PLATELET 264 10/20/2017 03:32 PM    MCV 92 10/20/2017 03:32 PM     Lab Results   Component Value Date/Time    Sodium 143 10/20/2017 03:32 PM    Potassium 4.6 10/20/2017 03:32 PM    Chloride 104 10/20/2017 03:32 PM    CO2 28 10/20/2017 03:32 PM    Anion gap 9 04/08/2016 09:10 AM    Glucose 87 10/20/2017 03:32 PM    BUN 12 10/20/2017 03:32 PM    Creatinine 0.82 10/20/2017 03:32 PM    BUN/Creatinine ratio 15 10/20/2017 03:32 PM    GFR est AA 111 10/20/2017 03:32 PM    GFR est non-AA 96 10/20/2017 03:32 PM    Calcium 9.2 10/20/2017 03:32 PM      No results found for: CPK, RCK1, RCK2, RCK3, RCK4, CKNDX, CKND1, TROPT, TROIQ, BNPP, BNP  No results found for: APTT  No results found for: INR, PTMR, PTP, PT1, PT2  No results found for: BNP, BNPP, XBNPT    Care Plan discussed with:     Total time spent with patient:     Lilli FewShashi K Cj Beecher, MD

## 2018-01-01 NOTE — Undefined (Addendum)
[facility] for [facility]. Accepting MD Dr. Lavonna RuaKalahasty.     Cath 01/01/18 L Main: Short; MLI, LAD: Prox diffsuse 80%; Mid subtotally occluded ; L to L collaterals seen, LCflex: Prox Lcflex - 80% Med OM1 - Prox 90%; RCA: 5 F 3DRC Small to med; MLI LVEF - not assessed; Nml EF on stess echo      Keep pt NPO for now.    Discussed with pt.     R groin sheath intact, no hematoma.     Heparin infusing.     Spouse will bring CD to [facility].

## 2018-01-01 NOTE — Discharge Summary (Signed)
See NP note

## 2018-01-01 NOTE — Progress Notes (Addendum)
1310: pt arrived on unit and transferred to ICU bed. Heparin running at 12units/kg/hr. Groin site clean dry and intace without swelling or hematoma present. Pulses present and extremity pink and warm to touch.   1335: [facility]. Patient going to CICU room 116. Will call report once transport is set up.   1350: Called report to Marchelle FolksAmanda, RN on 10th floor CICU at [facility].  1414: Critical Care Transport at bedside to get patient.

## 2018-01-01 NOTE — Telephone Encounter (Signed)
Spoke with Trinna Postlex - Coordination of Care [phone] has been authorized/approved by [employer] insurance. Patient can proceed with procedure as scheduled.

## 2018-01-01 NOTE — Progress Notes (Addendum)
Patient arrived. ID and allergies verified verbally with patient. Pt voices understanding of procedure to be performed. Consent obtained. Pt prepped for procedure. Labs sent.  10:00,INR is 1.0.  10:45,Asa 325mg  po given.  10:55.TRANSFER - OUT REPORT:    Verbal report given to Barbara,RN(name) on Noralee Spaceavi Daniele, MD  being transferred to cath(unit) for routine progression of care       Report consisted of patient???s Situation, Background, Assessment and   Recommendations(SBAR).     Information from the following report(s) Procedure Summary was reviewed with the receiving nurse.    Lines:   Peripheral IV 01/01/18 Left Forearm (Active)   Site Assessment Clean, dry, & intact 01/01/2018  9:44 AM        Opportunity for questions and clarification was provided.      Patient transported with:   Registered Nurse  .12:10 TRANSFER - IN REPORT:    Verbal report received from Barbara,RN(name) on Deandrew Acoff, MD  being received from cath(unit) for routine progression of care      Report consisted of patient???s Situation, Background, Assessment and   Recommendations(SBAR).     Information from the following report(s) Procedure Summary was reviewed with the receiving nurse.    Opportunity for questions and clarification was provided.      Assessment completed upon patient???s arrival to unit and care assumed.   12:45 TRANSFER - OUT REPORT:    Verbal report given to Danielle,RN(name) on Noralee Spaceavi Sylvan, MD  being transferred to ICU 2(unit) for routine progression of care       Report consisted of patient???s Situation, Background, Assessment and   Recommendations(SBAR).     Information from the following report(s) Procedure Summary was reviewed with the receiving nurse.    Lines:   Peripheral IV 01/01/18 Left Forearm (Active)   Site Assessment Clean, dry, & intact 01/01/2018  9:44 AM        Opportunity for questions and clarification was provided.      Patient transported with:   Monitor  Registered Nurse    12:45,Dr.Prabhakar @ bedside,he spoke with [facility] cardiac Surgeon,Dr.Kasirajan.Pt will go to ICU,and await transfer instructions from [facility].

## 2018-01-01 NOTE — Procedures (Signed)
BRIEF PROCEDURE NOTE    Date of Procedure: 01/01/2018   Preoperative Diagnosis: Pos stress test/New onset angina  Postoperative Diagnosis: Severe 2 V dz   Procedure: Left heart cath, LV angiography, coronary angiography  Interventional Cardiologist: Alok Minshall K Cooper Moroney, MD  Anesthesia: local + IV sedation  Estimated Blood Loss: Minimal  Findings:       L Main:  Short; MLI    LAD: Prox diffsuse 80%; Mid subtotally occluded ; L to L collaterals seen    LCflex: Prox Lcflex - 80% Med OM1 - Prox 90%;     RCA:  5 F  3DRC Small to med; MLI    LVEF - not assessed; Nml EF on stess echo     No significant gradient across aortic valve.    Specimens Removed : None    Closure Device: none    Will start on Heparin gtt  Will transfer for CTS opinion/CABG - pt and wife request Dr Kasirajan   D/w Dr Kasirajan - CTS at [facility] - he accepts pt for transfer        See full cath note.    Complications: none    Annalysse Shoemaker K Winslow Ederer, MD

## 2018-01-01 NOTE — Progress Notes (Signed)
Reason for Admission:   Severe 2 vessel disease                   RRAT Score:          3           Plan for utilizing home health:      Transferring urgently to [facility]                    Likelihood of Readmission:  Low/green                         Transition of Care Plan:      I received notification from    Cardiology that Dr.Acel Judeth CornfieldShankar will need to be urgently transferred to [facility] under the services of Dr Karalee HeightKasirajan who wants pt there immediately.  I contacted [facility] and have issued ALS with sirens and lights to transport pt to the critical care unit @ [facility] on a heparin gtt and right sheath intact in his right groin.I insured ALS can handle pt.'s right groin sheath .  CD of pt.'s cardiac catherization is in the process of being burned by radiology and pt.'s wife will be taking the CD with her to [facility].   Chart prepared for [facility] including demographics with insurance,clinicals ,MAR,VS, labs,and AVS.PCS for transport prepared for transport team with sirens & lights.Both copied chart and PCS given to pt.'s nurse.  Accepting physician is now Dr.Kalahasty.     Beth Aristov,RN

## 2018-01-01 NOTE — Procedures (Signed)
BRIEF PROCEDURE NOTE    Date of Procedure: 01/01/2018   Preoperative Diagnosis: Pos stress test/New onset angina  Postoperative Diagnosis: Severe 2 V dz   Procedure: Left heart cath, LV angiography, coronary angiography  Interventional Cardiologist: Lilli FewShashi K Karalyn Kadel, MD  Anesthesia: local + IV sedation  Estimated Blood Loss: Minimal  Findings:       L Main:  Short; MLI    LAD: Prox diffsuse 80%; Mid subtotally occluded ; L to L collaterals seen    LCflex: Prox Lcflex - 80% Med OM1 - Prox 90%;     RCA:  5 F  3DRC Small to med; MLI    LVEF - not assessed; Nml EF on stess echo     No significant gradient across aortic valve.    Specimens Removed : None    Closure Device: none    Will start on Heparin gtt  Will transfer for CTS opinion/CABG - pt and wife request Dr Karalee HeightKasirajan   D/w Dr Karalee HeightKasirajan [facility] - he accepts pt for transfer        See full cath note.    Complications: none    Lilli FewShashi K Janee Ureste, MD

## 2018-01-01 NOTE — Progress Notes (Signed)
ALS with lights & sirens here within 15 minutes to transport pt to [facility].Beth Aristov,RN

## 2018-01-06 NOTE — Progress Notes (Signed)
Received notification of transfer to [facility] for urgent cardiac surgical procedure, transferred on 01/01/18;  Patient on report as eligible for Case Management.  Left discreet message on voicemail with this CM contact information.  Will attempt to contact again to offer [facility] services.  Patient may still be inpatient;  Will FU next call in 48 hrs;

## 2018-01-08 ENCOUNTER — Encounter: Primary: Internal Medicine

## 2018-01-08 NOTE — Progress Notes (Signed)
Second attempt to reach patient for TOC Program, and discharge assessment. Discreet VM left. Will send UTR letter.

## 2018-01-13 ENCOUNTER — Ambulatory Visit
Admit: 2018-01-13 | Discharge: 2018-01-13 | Payer: PRIVATE HEALTH INSURANCE | Attending: Internal Medicine | Primary: Internal Medicine

## 2018-01-13 DIAGNOSIS — M791 Myalgia, unspecified site: Secondary | ICD-10-CM

## 2018-01-13 MED ORDER — COENZYME Q10 200 MG CAP
200 mg | ORAL_CAPSULE | Freq: Every day | ORAL | 3 refills | Status: AC
Start: 2018-01-13 — End: ?

## 2018-01-13 NOTE — Progress Notes (Signed)
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 Hospital Follow Up     [facility] for CAD, left heart cath. Transported to [facility] 01/04/18 for stent placement, d/c'd 01/06/18.      Pt here with his wife, who is also a physician.    He had seen GI for GERD symptoms and planned outpt stress on 2/28 with stress echo.  Notes no CP with echo, and stopped due to bigeminy.  He was seen by cardiology there and Dr. Valere DrossPrabhan--[facility].    They referred to [facility] due to concern stenting would not be effective.  They consulted with Kasirajan there and had interventional cardiology see him for stenting.  They felt like stenting would be most effective since no other RF.  Had stenting x 2 on 01-04-18.    He had CP post-procedure with elevated troponin.  Kept for post-pain monitoring.  Felt elev troponin was possible spasm or procedure related.  Had post-procedure echo and EKG's which were normal.    He notes they had not started ISDN, but too sensitive to NTG inpt.  They had not started inpt, and he started 2 days after discharge as planned.    He would have mid-epigastric pain which radiates to back.  He has CT abdomen     He has burping with pain.  Pain and burping not associated/consistent.  He would have more burping than pain.    He has improved burping with ISMN.    Cardiology had recommended Imdur.  Interventional had said to wait on Imdur due to sensitivity.  He still had back pain with food/eating-not with activity.  He then started Imdur after that.    Imdur started   Home 3/6.  3/7 texted interventional and sent Ranexa to [facility]--they ordered and ready in 2 days.  On 3/11, started Imdur from hospit discharge.  He started Imdur and plans to start Ranexa.      Sees cardiology next week.  Following with interventional next week.    He plans to start Ranexa and stop Imdur.     He is also having SE's from statin.  He is taking Lipitor in AM--held today and will try again today.    He wil review optimal medications above and statin dosing with cardiology at follow-up.         Prior to Admission medications    Medication Sig Start Date End Date Taking? Authorizing Provider   pantoprazole (PROTONIX) 40 mg tablet Take 40 mg by mouth daily.   Yes Provider, Historical   aspirin 81 mg chewable tablet CHEW AND SWALLOW ONE TABLET BY MOUTH EVERY DAY 01/06/18  Yes Provider, Historical   isosorbide mononitrate ER (IMDUR) 30 mg tablet TAKE ONE TABLET BY MOUTH EVERY DAY 01/06/18  Yes Provider, Historical   metoprolol succinate (TOPROL-XL) 25 mg XL tablet TAKE ONE TABLET BY MOUTH EVERY DAY 01/06/18  Yes Provider, Historical   BRILINTA 90 mg tablet TAKE ONE TABLET BY MOUTH EVERY 12 HOURS 01/06/18  Yes Provider, Historical   multivitamin (ONE A DAY) tablet Take 1 Tab by mouth daily.   Yes Provider, Historical   atorvastatin (LIPITOR) 20 mg tablet Take 1 Tab by mouth nightly.  Patient taking differently: Take 80 mg by mouth nightly. 01/01/18  Yes [name], [name], NP   nitroglycerin (NITROSTAT) 0.4 mg SL  tablet 1 Tab by SubLINGual route every five (5) minutes as needed for Chest Pain. 12/31/17  Yes Marcellus Scott, MD   secukinumab (COSENTYX PEN, 2 PENS,) 150 mg/mL pnij 300 mg by SubCUTAneous route every four (4) weeks.   Yes Provider, Historical   fluticasone (FLONASE) 50 mcg/actuation nasal spray 2 Sprays by Both Nostrils route as needed. 02/01/15  Yes Provider, Historical   loratadine (CLARITIN) 10 mg tablet Take 10 mg by mouth daily.   Yes Provider, Historical   omeprazole (PRILOSEC) 40 mg capsule Take 40 mg by mouth daily.    Provider, Historical   0.9% sodium chloride solution 75 mL/hr by IntraVENous route continuous. 01/01/18   Vincent Peyer, NP   atovaquone-proguanil (MALARONE) 250-100 mg per tablet Take 1 Tab by mouth daily. Start one day prior to arrival in malaria area, and continue for 7  days after departure/return from risk area. 10/21/17   [name], MD   ondansetron hcl (ZOFRAN, AS HYDROCHLORIDE,) 4 mg tablet Take 1 Tab by mouth every eight (8) hours as needed for Nausea.  Patient not taking: Reported on 06/03/2016 04/08/16   Evelena Leyden, MD        ROS    Vitals:    01/13/18 0843   BP: 96/53   Pulse: 87   Resp: 20   Temp: 98 ??F (36.7 ??C)   TempSrc: Oral   SpO2: 96%   Weight: 147 lb 8 oz (66.9 kg)   Height: 5\' 9"  (1.753 m)   PainSc:   3   PainLoc: Generalized      Body mass index is 21.78 kg/m??.    Physical Exam:     Physical Exam   Constitutional: He appears well-developed and well-nourished. No distress.   HENT:   Head: Normocephalic and atraumatic.   Eyes: Conjunctivae are normal. Right eye exhibits no discharge. Left eye exhibits no discharge. No scleral icterus.   Neck: Neck supple.   Cardiovascular: Normal rate, regular rhythm, normal heart sounds and intact distal pulses. Exam reveals no gallop and no friction rub.   No murmur heard.  Pulmonary/Chest: Effort normal and breath sounds normal. No respiratory distress. He has no wheezes. He has no rales.   Abdominal: Soft. Bowel sounds are normal. He exhibits no distension. There is no tenderness.   Musculoskeletal: He exhibits no edema or tenderness.   Neurological: He is alert. He exhibits normal muscle tone. Coordination normal.   Skin: Skin is warm. No rash noted. He is not diaphoretic. No erythema. No pallor.   Psychiatric: He has a normal mood and affect. His behavior is normal. Judgment and thought content normal.       Assessment and Plan:       ICD-10-CM ICD-9-CM    1. Myalgia M79.10 729.1 coenzyme Q-10 (CO Q-10) 200 mg capsule      METABOLIC PANEL, COMPREHENSIVE      CK      VITAMIN B12 & FOLATE      T4, FREE      TSH 3RD GENERATION      VITAMIN D, 1,25 + 25-HYDROXY   2. On statin therapy Z79.899 V58.69 coenzyme Q-10 (CO Q-10) 200 mg capsule      METABOLIC PANEL, COMPREHENSIVE      CK      VITAMIN B12 & FOLATE       T4, FREE      TSH 3RD GENERATION      VITAMIN D, 1,25 + 25-HYDROXY  3. Abnormal CT of the abdomen R93.5 793.6 LIPASE   4. Coronary artery disease due to lipid rich plaque I25.10 414.00     I25.83 414.3    5. S/P angioplasty with stent Z95.9 V45.89        1,2.  Labs reviewed up evaluate at visit.    3.  To follow-up with GI as reviewed.    4,5:  Medication clarification with cardiology at upcoming follow-up.      Follow-up and Dispositions    ?? Return in about 3 months (around 04/15/2018), or if symptoms worsen or fail to improve, for yearly physical,  PPSV-23 (pneumonia) vaccine.       lab results and schedule of future lab studies reviewed with patient  reviewed medications and side effects in detail  radiology results and schedule of future radiology studies reviewed with patient    For additional documentation of information and/or recommendations discussed this visit, please see notes in instructions.      Plan and evaluation (above) reviewed with pt/wife at visit  Patient/wife voiced understanding of plan and provided with time to ask/review questions.  After Visit Summary (AVS) provided to pt/wife after visit with additional instructions as needed/reviewed.

## 2018-01-13 NOTE — Telephone Encounter (Signed)
Pt stating he would like to talk with Dr. Demetrius CharityP about his condition.     He can be reached at [phone].    [employer]~

## 2018-01-13 NOTE — Progress Notes (Signed)
RM 17    Patient not fasting at this time.    Chief Complaint   Patient presents with   ??? Hospital Follow Up     [facility] for CAD, left heart cath. Transported to [facility] 01/04/18 for stent placement, d/c'd 01/06/18.      1. Have you been to the ER, urgent care clinic since your last visit?  Hospitalized since your last visit?Yes Reason for visit: 01/01/18, [facility], CAD, left heart cath       2. Have you seen or consulted any other health care providers outside of the [facility] since your last visit?  Include any pap smears or colon screening. Yes Reason for visit: 02/17/17, [facility], [facility]-new [facility] referred    Health Maintenance Due   Topic Date Due   ??? FOBT Q 1 YEAR AGE 83-75  10/30/2006     Abuse Screening Questionnaire 01/13/2018   Do you ever feel afraid of your partner? N   Are you in a relationship with someone who physically or mentally threatens you? N   Is it safe for you to go home? Y     3 most recent PHQ Screens 01/13/2018   Little interest or pleasure in doing things Several days   Feeling down, depressed, irritable, or hopeless Several days   Total Score PHQ 2 2     Learning Assessment 01/13/2018   PRIMARY LEARNER Patient   HIGHEST LEVEL OF EDUCATION - PRIMARY LEARNER  -   BARRIERS PRIMARY LEARNER NONE   CO-LEARNER CAREGIVER -   PRIMARY LANGUAGE ENGLISH   LEARNER PREFERENCE PRIMARY VIDEOS     READING   ANSWERED BY patient   RELATIONSHIP SELF

## 2018-01-13 NOTE — Telephone Encounter (Signed)
Returned patient's call [phone] he would like for you [phone] call him to talk with you about his visit at [facility] recently.

## 2018-01-14 LAB — METABOLIC PANEL, COMPREHENSIVE
A-G Ratio: 1.5 (ref 1.2–2.2)
ALT (SGPT): 20 IU/L (ref 0–44)
AST (SGOT): 19 IU/L (ref 0–40)
Albumin: 4.4 g/dL (ref 3.6–4.8)
Alk. phosphatase: 77 IU/L (ref 39–117)
BUN/Creatinine ratio: 10 (ref 10–24)
BUN: 10 mg/dL (ref 8–27)
Bilirubin, total: 0.7 mg/dL (ref 0.0–1.2)
CO2: 23 mmol/L (ref 20–29)
Calcium: 9.5 mg/dL (ref 8.6–10.2)
Chloride: 104 mmol/L (ref 96–106)
Creatinine: 0.98 mg/dL (ref 0.76–1.27)
GLOBULIN, TOTAL: 2.9 g/dL (ref 1.5–4.5)
Glucose: 91 mg/dL (ref 65–99)
Potassium: 4.4 mmol/L (ref 3.5–5.2)
Protein, total: 7.3 g/dL (ref 6.0–8.5)
Sodium: 141 mmol/L (ref 134–144)

## 2018-01-14 LAB — LIPASE: Lipase: 49 U/L (ref 13–78)

## 2018-01-14 LAB — CK: Creatine Kinase,Total: 118 U/L (ref 24–204)

## 2018-01-14 LAB — VITAMIN D, 1,25 + 25-HYDROXY
Calcitriol (Vit D 1, 25 di-OH): 48.2 pg/mL (ref 19.9–79.3)
VITAMIN D, 25-HYDROXY: 14.3 ng/mL — ABNORMAL LOW (ref 30.0–100.0)

## 2018-01-14 LAB — VITAMIN B12 & FOLATE
Folate: 10.1 ng/mL (ref >3.0)
Vitamin B12: 231 pg/mL — ABNORMAL LOW (ref 232–1245)

## 2018-01-14 LAB — T4, FREE: T4, Free: 1.06 ng/dL (ref 0.82–1.77)

## 2018-01-14 LAB — TSH 3RD GENERATION: TSH: 0.899 u[IU]/mL (ref 0.450–4.500)

## 2018-01-22 ENCOUNTER — Inpatient Hospital Stay: Admit: 2018-01-22 | Payer: PRIVATE HEALTH INSURANCE | Primary: Internal Medicine

## 2018-01-22 DIAGNOSIS — Z955 Presence of coronary angioplasty implant and graft: Secondary | ICD-10-CM

## 2018-01-22 NOTE — Other (Signed)
Anthony Spaceavi Erbe, MD DOB 09-30-2057presented for cardiac rehab orientation and exercise tolerance test today with a primary diagnosis of PCI and CAD . Pt has no previous cardia history. LVEF is 60% . Cardiac risk factors include: age, gender,  HLD, family history, and sedentary lifestyle. CAD risk factors were reviewed with patient.   Pt is  married and lives with his wife.  Works f/t as Development worker, communityphysician.   Depression score PHQ9 is 0 and this is considered normal. The result was discussed with patient who affirms score to be accurate.  Pt feels that after his PCI procedure, he has a more appreciative outlook on life.   Patient denied chest pain or SOB during 6 minute walk on treadmill at 2.9 mph, with RPE 12. Cardiac rhythm was SR without ectopy. Exercise plan was developed. Pt will attend cardiac rehab 2 times per week.   Pt verbalized plan to engage in home exercise of walking.  Cardiac Rehab book reviewed and given to patient.  Reine JustNancy Finnean Cerami RN

## 2018-01-28 ENCOUNTER — Inpatient Hospital Stay: Admit: 2018-01-28 | Payer: PRIVATE HEALTH INSURANCE | Primary: Internal Medicine

## 2018-01-29 ENCOUNTER — Inpatient Hospital Stay: Admit: 2018-01-29 | Payer: PRIVATE HEALTH INSURANCE | Primary: Internal Medicine

## 2018-02-01 ENCOUNTER — Inpatient Hospital Stay: Admit: 2018-02-01 | Payer: PRIVATE HEALTH INSURANCE | Primary: Internal Medicine

## 2018-02-01 DIAGNOSIS — Z955 Presence of coronary angioplasty implant and graft: Secondary | ICD-10-CM

## 2018-02-03 ENCOUNTER — Encounter: Payer: PRIVATE HEALTH INSURANCE | Primary: Internal Medicine

## 2018-02-08 NOTE — Progress Notes (Signed)
Resolving current episode for case management due to patient unable to reach. Patient has not been reached after repeated calls and letters. Letter sent to patient notifying completion of services due to unable to reach. This writer's contact information and information regarding program services included in materials sent.

## 2018-02-10 ENCOUNTER — Inpatient Hospital Stay: Admit: 2018-02-10 | Payer: PRIVATE HEALTH INSURANCE | Primary: Internal Medicine

## 2018-02-17 ENCOUNTER — Encounter: Payer: PRIVATE HEALTH INSURANCE | Primary: Internal Medicine

## 2018-02-22 ENCOUNTER — Inpatient Hospital Stay: Admit: 2018-02-22 | Payer: PRIVATE HEALTH INSURANCE | Primary: Internal Medicine

## 2018-03-01 ENCOUNTER — Inpatient Hospital Stay: Admit: 2018-03-01 | Payer: PRIVATE HEALTH INSURANCE | Primary: Internal Medicine

## 2018-03-04 NOTE — Telephone Encounter (Signed)
From: Noralee Space, MD  To: [name], MD  Sent: 03/02/2018 9:08 PM EDT  Subject: Visit Follow-Up Question    Dr. Theda Belfast,  I think I am due for my pneumovax injection. Do you give it in your office? I would like to make an appointment. Also, if you can give me the CPT code, I will check with my insurance.  Thank you    Anthony Short

## 2018-03-04 NOTE — Telephone Encounter (Signed)
Please provide cpt code for pneumovax  Thanks

## 2018-03-31 ENCOUNTER — Inpatient Hospital Stay: Admit: 2018-03-31 | Payer: PRIVATE HEALTH INSURANCE | Primary: Internal Medicine

## 2018-03-31 ENCOUNTER — Inpatient Hospital Stay: Admit: 2018-03-31 | Payer: PRIVATE HEALTH INSURANCE | Attending: Registered" | Primary: Internal Medicine

## 2018-03-31 DIAGNOSIS — Z955 Presence of coronary angioplasty implant and graft: Secondary | ICD-10-CM

## 2018-03-31 NOTE — Progress Notes (Signed)
Met with Anthony Short for 60 minute nutrition education.    Anthony Short has history of CAD with stents and hyperlipidemia.  He states he was raised a vegetarian but began eating animal products and meats as an adult.  Since his stents he has resumed a vegetarian diet.  He is familiar with several books that recommend various plant based diets for CAD including a fat free vegan plan promoted by Dr. Cyd Silence.  Anthony Short and his wife have some questions and concerns regarding how to eat s/p stent.  Based on diet recall patient is consuming protein in the form of tofu, beans or quinoa at each meal, choosing whole grains, and eating large amounts of fruits and vegetables daily. He verbalizes some concern if he is eating too many carbs.  He presents with several questions regarding plant based diets.    Nutrition Diagnosis: Food and nutrition knowledge deficit related to new diagnosis of CAD as evidenced by patient requesting meeting with RD.    Goal: Understand the potential benefits and risks of plant-based diets and how to ensure nutritional needs are met.    Education: Educated patient, and via speaker phone his wife, on potential risks and benefits of plant based diets.  Specifically discussed the limitations of nutrition studies on being able to demonstrate one clear option to guarantee reversal of heart disease.  Discussed the eatinng patterns of DASH, Mediterranean and plant based have all shown clinical benefit in a variety of studies.  Discussed the need for taking a daily multivitamin to include B12 and vitamin D if on a strict vegan diet. Reinforced inclusion of plant protein at each meal. Reinforced eating generous amounts of vegetables and fruits daily. Reinforced consuming whole grains at most meals.  Reassured patient no clinical rationale for him to restrict carbohydrates so long as emphasizing the minimally processed plant sources he is now consuming. Encouraged emphasis on low or no fat dairy and egg  whites with 0-3 yolks per week if chooses to include in plant based plan.    Anthony Short verbalized understanding and denies further questions at this time.  Contact information provided should he need to follow up with the dietitian.

## 2018-03-31 NOTE — Progress Notes (Signed)
Met with Anthony Short for 60 minute nutrition education.    Anthony Short has history of CAD with stents and hyperlipidemia.  He states he was raised a vegetarian but began eating animal products and meats as an adult.  Since his stents he has resumed a vegetarian diet.  He is familiar with several books that recommend various plant based diets for CAD including a fat free vegan plan promoted by Dr. Cyd Silence.  Anthony Short and his wife have some questions and concerns regarding how to eat s/p stent.  Based on diet recall patient is consuming protein in the form of tofu, beans or quinoa at each meal, choosing whole grains, and eating large amounts of fruits and vegetables daily. He verbalizes some concern if he is eating too many carbs.  He presents with several questions regarding plant based diets.    Nutrition Diagnosis: Food and nutrition knowledge deficit related to new diagnosis of CAD as evidenced by patient requesting meeting with RD.    Goal: Understand the potential benefits and risks of plant-based diets and how to ensure nutritional needs are met.    Education: Educated patient, and via speaker phone his wife, on potential risks and benefits of plant based diets.  Specifically discussed the limitations of nutrition studies on being able to demonstrate one clear option to guarantee reversal of heart disease.  Discussed the eatinng patterns of DASH, Mediterranean and plant based have all shown clinical benefit in a variety of studies.  Discussed the need for taking a daily multivitamin to include B12 and vitamin D if on a strict vegan diet. Reinforced inclusion of plant protein at each meal. Reinforced eating generous amounts of vegetables and fruits daily. Reinforced consuming whole grains at most meals.  Reassured patient no clinical rationale for him to restrict carbohydrates so long as emphasizing the minimally processed plant sources he is now consuming. Encouraged emphasis on low or  no fat dairy and egg whites with 0-3 yolks per week if chooses to include in plant based plan.    Anthony Short verbalized understanding and denies further questions at this time.  Contact information provided should he need to follow up with the dietitian.

## 2018-04-13 ENCOUNTER — Ambulatory Visit: Admit: 2018-04-13 | Payer: PRIVATE HEALTH INSURANCE | Attending: Rheumatology | Primary: Internal Medicine

## 2018-04-13 DIAGNOSIS — L4050 Arthropathic psoriasis, unspecified: Secondary | ICD-10-CM

## 2018-04-13 MED ORDER — SECUKINUMAB 150 MG/ML SUBCUTANEOUS PEN INJECTOR
150 mg/mL | INJECTION | SUBCUTANEOUS | 0 refills | Status: AC
Start: 2018-04-13 — End: 2018-04-14

## 2018-04-13 MED ORDER — DICLOFENAC 1 % TOPICAL GEL
1 % | Freq: Four times a day (QID) | CUTANEOUS | 6 refills | Status: AC
Start: 2018-04-13 — End: 2018-05-13

## 2018-04-13 MED ORDER — SECUKINUMAB 150 MG/ML SUBCUTANEOUS PEN INJECTOR
150 mg/mL | INJECTION | SUBCUTANEOUS | 6 refills | Status: AC
Start: 2018-04-13 — End: 2018-04-14

## 2018-04-13 MED ORDER — SECUKINUMAB 150 MG/ML SUBCUTANEOUS PEN INJECTOR
150 mg/mL | INJECTION | SUBCUTANEOUS | 0 refills | Status: DC
Start: 2018-04-13 — End: 2018-04-13

## 2018-04-13 NOTE — Progress Notes (Signed)
RHEUMATOLOGY PROBLEM LIST AND CHIEF COMPLAINT  1. Psoriatic arthritis - history of psoriasis, arthralgia, arthritis, and achilles enthesitis since 1998, diagnosed with PsA in 2002. Previously treated with methotrexate and Enbrel.    Therapy History:  Previous DMARDs: Enbrel, Humira (04/2016 - 08/2016; failed)   Current DMARDs: Cosentyx (10/2016 - current)    INTERVAL HISTORY  This is a 62 y.o. Asian male.  Today, the patient denies pain in the joints.  Location: None   Severity:  0 on a scale of 0-10  Timing: all day   Duration:  6 months  Context/Associated signs and symptoms: The patient is doing well today. His worst complaint today is pain along the base of his right wrist that usually worsens with activity. He continues with Cosentyx 300 mg monthly, Clobetasol for rash. He had a recent stent placed; but has tolerated the procedure well. [place].     RHEUMATOLOGY REVIEW OF SYSTEMS   Positives as per history  Negatives as follows:  CONSTITUTlONAL:  Denies unexplained persistent fevers, weight change, chronic fatigue  HEAD/EYES:   Denies eye redness, blurry vision or sudden loss of vision, dry eyes, HA, temporal artery pain  ENT:    Denies oral/nasal ulcers, recurrent sinus infections, dry mouth  RESPIRATORY:  No pleuritic pain, history of pleural effusions, hemoptysis, exertional dyspnea  CARDIOVASCULAR:  Denies chest pain, history of pericardial effusions  GASTRO:   Denies heartburn, esophageal dysmotility, abdominal pain, nausea, vomiting, diarrhea, blood in the stool  HEMATOLOGIC:  No easy bruising, purpura, swollen lymph nodes  SKIN:    Denies alopecia, ulcers, nodules, sun sensitivity, unexplained persistent rash   VASCULAR:   Denies edema, cyanosis, raynaud phenomenon  NEUROLOGIC:  Denies specific muscle weakness, paresthesias   PSYCHIATRIC:  No sleep disturbance / snoring, depression, anxiety  MSK:    No morning stiffness >1 hour, SI joint pain, persistent joint  swelling, persistent joint pain      PAST MEDICAL HISTORY  Reviewed with patient, significant changes in medical history - yes, stent placed.     FAMILY HISTORY  psoriasis - Uncle    MEDICATIONS  All the current medications were reviewed in detail.      PHYSICAL EXAM  Blood pressure (!) 85/47, pulse 67, temperature 98.1 ??F (36.7 ??C), temperature source Oral, resp. rate 16, weight 143 lb (64.9 kg), SpO2 96 %.  GENERAL APPEARANCE: Well-nourished adult in no acute distress.  EYES: No scleral erythema, conjunctival injection.  ENT: No oral ulcer, parotid enlargement.  NECK: No adenopathy, thyroid enlargement.  CARDIOVASCULAR: Heart rhythm is regular. No murmur, rub, gallop.  CHEST: Normal vesicular breath sounds. No wheezes, rales, pleural friction rubs.  ABDOMINAL: The abdomen is soft and nontender. Liver and spleen are nonpalpable. Bowel sounds are normal.  EXTREMITIES: There is no evidence of clubbing, cyanosis, edema.  SKIN: Post inflammatory hyperpigmentation, no active lesions    NEUROLOGICAL: Normal gait and station, full strength in upper and lower extremities, normal sensation to light touch.  MUSCULOSKELETAL:   Upper extremities - full range of motion, no tenderness, no swelling, no synovial thickening and no deformity of joints.  Lower extremities - full range of motion, no tenderness, no swelling, no synovial thickening and no deformity of joints.      LABS, RADIOLOGY AND PROCEDURES   Previous labs reviewed -Yes    ASSESSMENT  1. Psoriatic arthritis - (Established problem - Stable disease) - The patient's disease is stable on his medication Cosentyx 300 mg  monthly. He can continue Clobetasol PRN. He can start Diclofenac BID for his joint pain. He should continue to monitor his wrist symptoms. I do not need to check labs today.   2. Drug therapy monitoring for toxicity (biologic) - CBC, BUN, Cr, AST, ALT and albumin every 4 months    PLAN  1. Start topical Diclofenac PRN   2. Cosentyx 300 mg monthly   3. Celebrex for joint pain  4. Clobetasol BID      Brienna Bass M. Allena Katz, MD  Adult and Pediatric Rheumatology     [facility]  [address] [place], [place] [address], Phone [phone], Fax [phone]   E-mail: [email or web address]    Visiting Assistant Professor of Pediatrics    Department of Pediatrics, [facility]   [id number], [place], [place] [address], Phone [phone], Fax [phone]  E-mail: [email or web address]    There are no Patient Instructions on file for this visit.    cc:  [name], MD    Written by Drue Novel, scribe, as dictated by Neomia Dear. Allena Katz, M.D.

## 2018-04-13 NOTE — Progress Notes (Signed)
RHEUMATOLOGY PROBLEM LIST AND CHIEF COMPLAINT  1. Psoriatic arthritis - history of psoriasis, arthralgia, arthritis, and achilles enthesitis since 1998, diagnosed with PsA in 2002. Previously treated with methotrexate and Enbrel.    Therapy History:  Previous DMARDs: Enbrel, Humira (04/2016 - 08/2016; failed)   Current DMARDs: Cosentyx (10/2016 - current)    INTERVAL HISTORY  This is a 62 y.o. Asian male.  Today, the patient denies pain in the joints.  Location: None   Severity:  0 on a scale of 0-10  Timing: all day   Duration:  6 months  Context/Associated signs and symptoms: The patient is doing well today. His worst complaint today is pain along the base of his right wrist that usually worsens with activity. He continues with Cosentyx 300 mg monthly, Clobetasol for rash. He had a recent stent placed; but has tolerated the procedure well. The [place] to [place].     RHEUMATOLOGY REVIEW OF SYSTEMS   Positives as per history  Negatives as follows:  CONSTITUTlONAL:  Denies unexplained persistent fevers, weight change, chronic fatigue  HEAD/EYES:   Denies eye redness, blurry vision or sudden loss of vision, dry eyes, HA, temporal artery pain  ENT:    Denies oral/nasal ulcers, recurrent sinus infections, dry mouth  RESPIRATORY:  No pleuritic pain, history of pleural effusions, hemoptysis, exertional dyspnea  CARDIOVASCULAR:  Denies chest pain, history of pericardial effusions  GASTRO:   Denies heartburn, esophageal dysmotility, abdominal pain, nausea, vomiting, diarrhea, blood in the stool  HEMATOLOGIC:  No easy bruising, purpura, swollen lymph nodes  SKIN:    Denies alopecia, ulcers, nodules, sun sensitivity, unexplained persistent rash   VASCULAR:   Denies edema, cyanosis, raynaud phenomenon  NEUROLOGIC:  Denies specific muscle weakness, paresthesias   PSYCHIATRIC:  No sleep disturbance / snoring, depression, anxiety  MSK:    No morning stiffness >1 hour, SI joint pain, persistent joint  swelling, persistent joint pain      PAST MEDICAL HISTORY  Reviewed with patient, significant changes in medical history - yes, stent placed.     FAMILY HISTORY  psoriasis - Uncle    MEDICATIONS  All the current medications were reviewed in detail.      PHYSICAL EXAM  Blood pressure (!) 85/47, pulse 67, temperature 98.1 ??F (36.7 ??C), temperature source Oral, resp. rate 16, weight 143 lb (64.9 kg), SpO2 96 %.  GENERAL APPEARANCE: Well-nourished adult in no acute distress.  EYES: No scleral erythema, conjunctival injection.  ENT: No oral ulcer, parotid enlargement.  NECK: No adenopathy, thyroid enlargement.  CARDIOVASCULAR: Heart rhythm is regular. No murmur, rub, gallop.  CHEST: Normal vesicular breath sounds. No wheezes, rales, pleural friction rubs.  ABDOMINAL: The abdomen is soft and nontender. Liver and spleen are nonpalpable. Bowel sounds are normal.  EXTREMITIES: There is no evidence of clubbing, cyanosis, edema.  SKIN: Post inflammatory hyperpigmentation, no active lesions    NEUROLOGICAL: Normal gait and station, full strength in upper and lower extremities, normal sensation to light touch.  MUSCULOSKELETAL:   Upper extremities - full range of motion, no tenderness, no swelling, no synovial thickening and no deformity of joints.  Lower extremities - full range of motion, no tenderness, no swelling, no synovial thickening and no deformity of joints.      LABS, RADIOLOGY AND PROCEDURES   Previous labs reviewed -Yes    ASSESSMENT  1. Psoriatic arthritis - (Established problem - Stable disease) - The patient's disease is stable on his medication Cosentyx 300 mg  monthly. He can continue Clobetasol PRN. He can start Diclofenac BID for his joint pain. He should continue to monitor his wrist symptoms. I do not need to check labs today.   2. Drug therapy monitoring for toxicity (biologic) - CBC, BUN, Cr, AST, ALT and albumin every 4 months    PLAN  1. Start topical Diclofenac PRN   2. Cosentyx 300 mg monthly  3.  Celebrex for joint pain  4. Clobetasol BID      Shaquina Gillham M. Allena KatzPatel, MD  Adult and Pediatric Rheumatology     [facility]  [address], [place], [place] [id number], Phone [phone], Fax [phone]   E-mail: [email or web address]    Visiting Assistant Professor of Pediatrics    Department of Pediatrics, [facility]   [id number], [place], [place] [id number], Phone [phone], Fax [phone]  E-mail: [email or web address]    There are no Patient Instructions on file for this visit.    cc:  Donzetta Mattershildress, Clarence G, MD    Written by Drue NovelKirsten Riggle, scribe, as dictated by Neomia DearAarat M. Allena KatzPatel, M.D.

## 2018-04-13 NOTE — Progress Notes (Signed)
Chief Complaint   Patient presents with   ??? Joint Pain     1. Have you been to the ER, urgent care clinic since your last visit?  Hospitalized since your last visit? NO    2. Have you seen or consulted any other health care providers outside of the [facility] since your last visit?  Include any pap smears or colon screening. YES CARDIOLOGIST AT [facility] AND ENDOSCOPY ON MAY 15

## 2018-04-13 NOTE — Progress Notes (Signed)
 Chief Complaint   Patient presents with   . Joint Pain     1. Have you been to the ER, urgent care clinic since your last visit?  Hospitalized since your last visit? NO    2. Have you seen or consulted any other health care providers outside of the [facility] since your last visit?  Include any pap smears or colon screening. YES CARDIOLOGIST AT [facility] AND ENDOSCOPY ON MAY 15

## 2018-04-15 ENCOUNTER — Ambulatory Visit
Admit: 2018-04-15 | Discharge: 2018-04-16 | Payer: PRIVATE HEALTH INSURANCE | Attending: Internal Medicine | Primary: Internal Medicine

## 2018-04-15 DIAGNOSIS — Z0000 Encounter for general adult medical examination without abnormal findings: Secondary | ICD-10-CM

## 2018-04-15 NOTE — Patient Instructions (Addendum)
You can take either or vitamin B12 as reviewed, if you want to start supplement with diet changes.    We have B12 injections here also, if needed.                 Well Visit, Men 50 to 30: Care Instructions  Your Care Instructions    Physical exams can help you stay healthy. Your doctor has checked your overall health and may have suggested ways to take good care of yourself. He or she also may have recommended tests. At home, you can help prevent illness with healthy eating, regular exercise, and other steps.  Follow-up care is a key part of your treatment and safety. Be sure to make and go to all appointments, and call your doctor if you are having problems. It's also a good idea to know your test results and keep a list of the medicines you take.  How can you care for yourself at home?  ?? Reach and stay at a healthy weight. This will lower your risk for many problems, such as obesity, diabetes, heart disease, and high blood pressure.  ?? Get at least 30 minutes of exercise on most days of the week. Walking is a good choice. You also may want to do other activities, such as running, swimming, cycling, or playing tennis or team sports.  ?? Do not smoke. Smoking can make health problems worse. If you need help quitting, talk to your doctor about stop-smoking programs and medicines. These can increase your chances of quitting for good.  ?? Protect your skin from too much sun. When you're outdoors from 10 a.m. to 4 p.m., stay in the shade or cover up with clothing and a hat with a wide brim. Wear sunglasses that block UV rays. Even when it's cloudy, put broad-spectrum sunscreen (SPF 30 or higher) on any exposed skin.  ?? See a dentist one or two times a year for checkups and to have your teeth cleaned.  ?? Wear a seat belt in the car.  ?? Limit alcohol to 2 drinks a day. Too much alcohol can cause health problems.  Follow your doctor's advice about when to have certain tests. These tests  can spot problems early.  ?? Cholesterol. Your doctor will tell you how often to have this done based on your overall health and other things that can increase your risk for heart attack and stroke.  ?? Blood pressure. Have your blood pressure checked during a routine doctor visit. Your doctor will tell you how often to check your blood pressure based on your age, your blood pressure results, and other factors.  ?? Prostate exam. Talk to your doctor about whether you should have a blood test (called a PSA test) for prostate cancer. Experts disagree on whether men should have this test. Some experts recommend that you discuss the benefits and risks of the test with your doctor.  ?? Diabetes. Ask your doctor whether you should have tests for diabetes.  ?? Vision. Some experts recommend that you have yearly exams for glaucoma and other age-related eye problems starting at age 52.  ?? Hearing. Tell your doctor if you notice any change in your hearing. You can have tests to find out how well you hear.  ?? Colon cancer. You should begin tests for colon cancer at age 65. You may have one of several tests. Your doctor will tell you how often to have tests based on your age and risk. Risks  include whether you already had a precancerous polyp removed from your colon or whether your parent, brother, sister, or child has had colon cancer.  ?? Heart attack and stroke risk. At least every 4 to 6 years, you should have your risk for heart attack and stroke assessed. Your doctor uses factors such as your age, blood pressure, cholesterol, and whether you smoke or have diabetes to show what your risk for a heart attack or stroke is over the next 10 years.  ?? Abdominal aortic aneurysm. Ask your doctor whether you should have a test to check for an aneurysm. You may need a test if you ever smoked or if your parent, brother, sister, or child has had an aneurysm.  When should you call for help?   Watch closely for changes in your health, and be sure to contact your doctor if you have any problems or symptoms that concern you.  Where can you learn more?  Go to InsuranceStats.cahttp://www.healthwise.net/GoodHelpConnections.  Enter [phone] in the search box to learn more about "Well Visit, Men 50 to 6565: Care Instructions."  Current as of: January 28, 2017  Content Version: 11.9  ?? 2006-2018 Healthwise[employer]. Care instructions adapted under license by [employer] (which disclaims liability or warranty for this information). If you have questions about a medical condition or this instruction, always ask your healthcare professional. Healthwise, Incorporated disclaims any warranty or liability for your use of this information.

## 2018-04-15 NOTE — Progress Notes (Signed)
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 Complete Physical   ??? Immunization/Injection     PPSV 23     Here for physical.    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  Plans to start job in [place] soon.  His wife is also moving there to practice--but moving later.    He has copy of labs from [facility]--seeing [facility] tomorrow.  Reviewed labs from [facility] at visit.    He is on plant-based diet only.  Reviewed testing/updating labs today.  Reviewed B12 supplement.    He prefers no prostate exam--prefers PSA only--at lab reviewed.      Reviewed statin dose changes (consider 80mg  to 40mg  with cardiology.  Reviewed relative increase in LFT's with pt at visit and copy 2 baseline LFT values (from 10/2017 and 12/2016) reviewed.  He will review labs with [facility] cardiology at follow-up tomorrow.      AST (SGOT)   Date Value Ref Range Status   01/13/2018 19 0 - 40 IU/L Final   01/01/2018 19 15 - 37 U/L Final   10/20/2017 17 0 - 40 IU/L Final   12/19/2016 18 0 - 40 IU/L Final     ALT (SGPT)   Date Value Ref Range Status   01/13/2018 20 0 - 44 IU/L Final   01/01/2018 21 12 - 78 U/L Final   10/20/2017 14 0 - 44 IU/L Final   12/19/2016 19 0 - 44 IU/L Final       Nursing screenings reviewed by provider at visit.       Prior to Admission medications    Medication Sig Start Date End Date Taking? Authorizing Provider   raNITIdine (ZANTAC) 150 mg tablet TAKE 1 (ONE) TABLET BY MOUTH EVERY NIGHT AT BEDTIME prn 02/20/18  Yes Provider, Historical   diclofenac (VOLTAREN) 1 % gel Apply  to affected area four (4) times daily for 30 days. 04/13/18 05/13/18 Yes Rigoberto NoelPatel, Aarat, MD   pantoprazole (PROTONIX) 40 mg tablet Take 40 mg by mouth daily.   Yes Provider, Historical   aspirin 81 mg chewable tablet CHEW AND SWALLOW ONE TABLET BY MOUTH EVERY DAY 01/06/18  Yes Provider, Historical   metoprolol succinate (TOPROL-XL) 25 mg XL tablet TAKE ONE  TABLET BY MOUTH EVERY DAY 01/06/18  Yes Provider, Historical   BRILINTA 90 mg tablet TAKE ONE TABLET BY MOUTH EVERY 12 HOURS 01/06/18  Yes Provider, Historical   multivitamin (ONE A DAY) tablet Take 1 Tab by mouth daily.   Yes Provider, Historical   coenzyme Q-10 (CO Q-10) 200 mg capsule Take 1 Cap by mouth daily. 01/13/18  Yes Donzetta Mattershildress, Saphyra Hutt G, MD   atorvastatin (LIPITOR) 20 mg tablet Take 1 Tab by mouth nightly.  Patient taking differently: Take 80 mg by mouth nightly. 01/01/18  Yes [name], [name], NP   nitroglycerin (NITROSTAT) 0.4 mg SL tablet 1 Tab by SubLINGual route every five (5) minutes as needed for Chest Pain. 12/31/17  Yes Marcellus ScottBrowning, Christine M, MD   atovaquone-proguanil (MALARONE) 250-100 mg per tablet Take 1 Tab by mouth daily. Start one day prior to arrival in malaria area, and continue for 7 days after departure/return from risk area. 10/21/17  Yes Donzetta Mattershildress, Jaiquan Temme G, MD   secukinumab (COSENTYX PEN, 2 PENS,) 150 mg/mL pnij 300 mg by SubCUTAneous route every four (4)  weeks.   Yes Provider, Historical   fluticasone (FLONASE) 50 mcg/actuation nasal spray 2 Sprays by Both Nostrils route as needed. 02/01/15  Yes Provider, Historical   loratadine (CLARITIN) 10 mg tablet Take 10 mg by mouth daily.   Yes Provider, Historical   secukinumab (COSENTYX PEN, 2 PENS,) 150 mg/mL pnij 300 mg by SubCUTAneous route every four (4) weeks for 1 dose. Maintenance dose 04/13/18 04/14/18  Rigoberto Noel, MD   secukinumab (COSENTYX PEN, 2 PENS,) 150 mg/mL pnij 300 mg by SubCUTAneous route every four (4) weeks for 1 dose. Maintenance dose 04/13/18 04/14/18  Rigoberto Noel, MD   isosorbide mononitrate ER (IMDUR) 30 mg tablet TAKE ONE TABLET BY MOUTH EVERY DAY 01/06/18   Provider, Historical   omeprazole (PRILOSEC) 40 mg capsule Take 40 mg by mouth daily.    Provider, Historical   0.9% sodium chloride solution 75 mL/hr by IntraVENous route continuous. 01/01/18   Vincent Peyer, NP   ondansetron hcl (ZOFRAN, AS HYDROCHLORIDE,) 4 mg  tablet Take 1 Tab by mouth every eight (8) hours as needed for Nausea. 04/08/16   Evelena Leyden, MD        ROS    Vitals:    04/15/18 1620   BP: 104/62   Pulse: 63   Resp: 16   Temp: 97.8 ??F (36.6 ??C)   TempSrc: Oral   SpO2: 98%   Weight: 146 lb (66.2 kg)   Height: 5\' 9"  (1.753 m)   PainSc:   0 - No pain      Body mass index is 21.56 kg/m??.    Physical Exam:     Physical Exam   Constitutional: He appears well-developed and well-nourished. No distress.   HENT:   Head: Normocephalic and atraumatic.   Right Ear: External ear normal.   Left Ear: External ear normal.   Nose: Nose normal.   Mouth/Throat: Oropharynx is clear and moist. No oropharyngeal exudate.   Eyes: Conjunctivae are normal. Right eye exhibits no discharge. Left eye exhibits no discharge. No scleral icterus.   Neck: Normal range of motion. Neck supple. No tracheal deviation present. No thyromegaly present.   Cardiovascular: Normal rate, regular rhythm, normal heart sounds and intact distal pulses. Exam reveals no gallop and no friction rub.   No murmur heard.  Pulmonary/Chest: Effort normal and breath sounds normal. No stridor. No respiratory distress. He has no wheezes. He has no rales.   Abdominal: Soft. Bowel sounds are normal. He exhibits no distension. There is no tenderness.   Musculoskeletal: He exhibits no edema or tenderness.   Lymphadenopathy:     He has no cervical adenopathy.   Neurological: He is alert. He exhibits normal muscle tone. Coordination normal.   Skin: Skin is warm. No rash noted. He is not diaphoretic. No erythema. No pallor.   Psychiatric: He has a normal mood and affect. His behavior is normal. Judgment and thought content normal.       Assessment and Plan:       ICD-10-CM ICD-9-CM    1. Well adult exam Z00.00 V70.0 PSA W/ REFLX FREE PSA      CK      VITAMIN B12 & FOLATE      HEMOGLOBIN A1C WITH EAG   2. Encounter for immunization Z23 V03.89 PNEUMOCOCCAL POLYSACCHARIDE VACCINE, 23-VALENT, ADULT OR IMMUNOSUPPRESSED PT DOSE,        1.  He will update labs above with work (at [employer]).    2.  Reviewed Tdap  intervals with pt.  Updated copy of vaccinations provided to pt after visit (including today's PPSV-23) since moving to [place], [place].      Follow-up and Dispositions    ?? Return in about 1 year (around 04/16/2019), or if symptoms worsen or fail to improve, for yearly physical.       lab results and schedule of future lab studies reviewed with patient  reviewed medications and side effects in detail    For additional documentation of information and/or recommendations discussed this visit, please see notes in instructions.      Plan and evaluation (above) reviewed with pt at visit  Patient voiced understanding of plan and provided with time to ask/review questions.  After Visit Summary (AVS) provided to pt after visit with additional instructions as needed/reviewed.

## 2018-04-15 NOTE — Progress Notes (Signed)
Rm 18    Patient had echo done yesterday at [facility] and labs done.     Patient not fasting at this time.     Chief Complaint   Patient presents with   . Complete Physical   . Immunization/Injection     PPSV 23     1. Have you been to the ER, urgent care clinic since your last visit?  Hospitalized since your last visit?No    2. Have you seen or consulted any other health care providers outside of the [facility] since your last visit?  Include any pap smears or colon screening. No Cardiologist. April 2019, appt with them tomorrow     Health Maintenance Due   Topic Date Due   . FOBT Q 1 YEAR AGE 62-75  10/30/2006     Abuse Screening Questionnaire 04/15/2018   Do you ever feel afraid of your partner? N   Are you in a relationship with someone who physically or mentally threatens you? N   Is it safe for you to go home? Y     3 most recent PHQ Screens 04/15/2018   Little interest or pleasure in doing things Not at all   Feeling down, depressed, irritable, or hopeless Not at all   Total Score PHQ 2 0   Trouble falling or staying asleep, or sleeping too much -   Feeling tired or having little energy -   Poor appetite, weight loss, or overeating -   Feeling bad about yourself - or that you are a failure or have let yourself or your family down -   Trouble concentrating on things such as school, work, reading, or watching TV -   Moving or speaking so slowly that other people could have noticed; or the opposite being so fidgety that others notice -   Thoughts of being better off dead, or hurting yourself in some way -   PHQ 9 Score -     Learning Assessment 04/13/2018   PRIMARY LEARNER Patient   HIGHEST LEVEL OF EDUCATION - PRIMARY LEARNER  -   BARRIERS PRIMARY LEARNER -   CO-LEARNER CAREGIVER No   PRIMARY LANGUAGE ENGLISH   LEARNER PREFERENCE PRIMARY DEMONSTRATION     -   ANSWERED BY PATIENT   RELATIONSHIP SELF     Immunization/s administered 04/16/2018 by Zollie Scale, LPN with patient's consent.    Patient  tolerated procedure well.  No reactions noted.  VIS provided.

## 2018-04-15 NOTE — Progress Notes (Signed)
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 Complete Physical   ??? Immunization/Injection     PPSV 23     Here for physical.    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  Plans to start job in [place] soon.  His wife is also moving there to practice--but moving later.    He has copy of labs from [facility]--seeing [facility] tomorrow.  Reviewed labs from [facility] at visit.    He is on plant-based diet only.  Reviewed testing/updating labs today.  Reviewed B12 supplement.    He prefers no prostate exam--prefers PSA only--at lab reviewed.      Reviewed statin dose changes (consider 80mg  to 40mg  with cardiology.  Reviewed relative increase in LFT's with pt at visit and copy 2 baseline LFT values (from 10/2017 and 12/2016) reviewed.  He will review labs with [facility] cardiology at follow-up tomorrow.      AST (SGOT)   Date Value Ref Range Status   01/13/2018 19 0 - 40 IU/L Final   01/01/2018 19 15 - 37 U/L Final   10/20/2017 17 0 - 40 IU/L Final   12/19/2016 18 0 - 40 IU/L Final     ALT (SGPT)   Date Value Ref Range Status   01/13/2018 20 0 - 44 IU/L Final   01/01/2018 21 12 - 78 U/L Final   10/20/2017 14 0 - 44 IU/L Final   12/19/2016 19 0 - 44 IU/L Final       Nursing screenings reviewed by provider at visit.       Prior to Admission medications    Medication Sig Start Date End Date Taking? Authorizing Provider   raNITIdine (ZANTAC) 150 mg tablet TAKE 1 (ONE) TABLET BY MOUTH EVERY NIGHT AT BEDTIME prn 02/20/18  Yes Provider, Historical   diclofenac (VOLTAREN) 1 % gel Apply  to affected area four (4) times daily for 30 days. 04/13/18 05/13/18 Yes Rigoberto Noel, MD   pantoprazole (PROTONIX) 40 mg tablet Take 40 mg by mouth daily.   Yes Provider, Historical   aspirin 81 mg chewable tablet CHEW AND SWALLOW ONE TABLET BY MOUTH EVERY DAY 01/06/18  Yes Provider, Historical    metoprolol succinate (TOPROL-XL) 25 mg XL tablet TAKE ONE TABLET BY MOUTH EVERY DAY 01/06/18  Yes Provider, Historical   BRILINTA 90 mg tablet TAKE ONE TABLET BY MOUTH EVERY 12 HOURS 01/06/18  Yes Provider, Historical   multivitamin (ONE A DAY) tablet Take 1 Tab by mouth daily.   Yes Provider, Historical   coenzyme Q-10 (CO Q-10) 200 mg capsule Take 1 Cap by mouth daily. 01/13/18  Yes [name], MD   atorvastatin (LIPITOR) 20 mg tablet Take 1 Tab by mouth nightly.  Patient taking differently: Take 80 mg by mouth nightly. 01/01/18  Yes Vincent Peyer, NP   nitroglycerin (NITROSTAT) 0.4 mg SL tablet 1 Tab by SubLINGual route every five (5) minutes as needed for Chest Pain. 12/31/17  Yes Marcellus Scott, MD   atovaquone-proguanil (MALARONE) 250-100 mg per tablet Take 1 Tab by mouth daily. Start one day prior to arrival in malaria area, and continue for 7 days after departure/return from risk area. 10/21/17  Yes [name], MD   secukinumab (COSENTYX PEN, 2 PENS,) 150 mg/mL pnij 300 mg by SubCUTAneous route every four (4)  weeks.   Yes Provider, Historical   fluticasone (FLONASE) 50 mcg/actuation nasal spray 2 Sprays by Both Nostrils route as needed. 02/01/15  Yes Provider, Historical   loratadine (CLARITIN) 10 mg tablet Take 10 mg by mouth daily.   Yes Provider, Historical   secukinumab (COSENTYX PEN, 2 PENS,) 150 mg/mL pnij 300 mg by SubCUTAneous route every four (4) weeks for 1 dose. Maintenance dose 04/13/18 04/14/18  Rigoberto Noel, MD   secukinumab (COSENTYX PEN, 2 PENS,) 150 mg/mL pnij 300 mg by SubCUTAneous route every four (4) weeks for 1 dose. Maintenance dose 04/13/18 04/14/18  Rigoberto Noel, MD   isosorbide mononitrate ER (IMDUR) 30 mg tablet TAKE ONE TABLET BY MOUTH EVERY DAY 01/06/18   Provider, Historical   omeprazole (PRILOSEC) 40 mg capsule Take 40 mg by mouth daily.    Provider, Historical   0.9% sodium chloride solution 75 mL/hr by IntraVENous route continuous.  01/01/18   Vincent Peyer, NP   ondansetron hcl (ZOFRAN, AS HYDROCHLORIDE,) 4 mg tablet Take 1 Tab by mouth every eight (8) hours as needed for Nausea. 04/08/16   Evelena Leyden, MD        ROS    Vitals:    04/15/18 1620   BP: 104/62   Pulse: 63   Resp: 16   Temp: 97.8 ??F (36.6 ??C)   TempSrc: Oral   SpO2: 98%   Weight: 146 lb (66.2 kg)   Height: 5\' 9"  (1.753 m)   PainSc:   0 - No pain      Body mass index is 21.56 kg/m??.    Physical Exam:     Physical Exam   Constitutional: He appears well-developed and well-nourished. No distress.   HENT:   Head: Normocephalic and atraumatic.   Right Ear: External ear normal.   Left Ear: External ear normal.   Nose: Nose normal.   Mouth/Throat: Oropharynx is clear and moist. No oropharyngeal exudate.   Eyes: Conjunctivae are normal. Right eye exhibits no discharge. Left eye exhibits no discharge. No scleral icterus.   Neck: Normal range of motion. Neck supple. No tracheal deviation present. No thyromegaly present.   Cardiovascular: Normal rate, regular rhythm, normal heart sounds and intact distal pulses. Exam reveals no gallop and no friction rub.   No murmur heard.  Pulmonary/Chest: Effort normal and breath sounds normal. No stridor. No respiratory distress. He has no wheezes. He has no rales.   Abdominal: Soft. Bowel sounds are normal. He exhibits no distension. There is no tenderness.   Musculoskeletal: He exhibits no edema or tenderness.   Lymphadenopathy:     He has no cervical adenopathy.   Neurological: He is alert. He exhibits normal muscle tone. Coordination normal.   Skin: Skin is warm. No rash noted. He is not diaphoretic. No erythema. No pallor.   Psychiatric: He has a normal mood and affect. His behavior is normal. Judgment and thought content normal.       Assessment and Plan:       ICD-10-CM ICD-9-CM    1. Well adult exam Z00.00 V70.0 PSA W/ REFLX FREE PSA      CK      VITAMIN B12 & FOLATE      HEMOGLOBIN A1C WITH EAG    2. Encounter for immunization Z23 V03.89 PNEUMOCOCCAL POLYSACCHARIDE VACCINE, 23-VALENT, ADULT OR IMMUNOSUPPRESSED PT DOSE,       1.  He will update labs above with work (at [employer]).    2.  Reviewed Tdap  intervals with pt.  Updated copy of vaccinations provided to pt after visit (including today's PPSV-23) since moving to [place], [place].      Follow-up and Dispositions    ?? Return in about 1 year (around 04/16/2019), or if symptoms worsen or fail to improve, for yearly physical.       lab results and schedule of future lab studies reviewed with patient  reviewed medications and side effects in detail    For additional documentation of information and/or recommendations discussed this visit, please see notes in instructions.      Plan and evaluation (above) reviewed with pt at visit  Patient voiced understanding of plan and provided with time to ask/review questions.  After Visit Summary (AVS) provided to pt after visit with additional instructions as needed/reviewed.

## 2018-04-15 NOTE — Progress Notes (Signed)
Rm 18    Patient had echo [facility] and labs done.     Patient not fasting at this time.     Chief Complaint   Patient presents with   ??? Complete Physical   ??? Immunization/Injection     PPSV 23     1. Have you been to the ER, urgent care clinic since your last visit?  Hospitalized since your last visit?No    2. Have you seen or consulted any other health care providers outside of the [facility] since your last visit?  Include any pap smears or colon screening. No Cardiologist. April 2019, appt with them tomorrow     Health Maintenance Due   Topic Date Due   ??? FOBT Q 1 YEAR AGE 2-75  10/30/2006     Abuse Screening Questionnaire 04/15/2018   Do you ever feel afraid of your partner? N   Are you in a relationship with someone who physically or mentally threatens you? N   Is it safe for you to go home? Y     3 most recent PHQ Screens 04/15/2018   Little interest or pleasure in doing things Not at all   Feeling down, depressed, irritable, or hopeless Not at all   Total Score PHQ 2 0   Trouble falling or staying asleep, or sleeping too much -   Feeling tired or having little energy -   Poor appetite, weight loss, or overeating -   Feeling bad about yourself - or that you are a failure or have let yourself or your family down -   Trouble concentrating on things such as school, work, reading, or watching TV -   Moving or speaking so slowly that other people could have noticed; or the opposite being so fidgety that others notice -   Thoughts of being better off dead, or hurting yourself in some way -   PHQ 9 Score -     Learning Assessment 04/13/2018   PRIMARY LEARNER Patient   HIGHEST LEVEL OF EDUCATION - PRIMARY LEARNER  -   BARRIERS PRIMARY LEARNER -   CO-LEARNER CAREGIVER No   PRIMARY LANGUAGE ENGLISH   LEARNER PREFERENCE PRIMARY DEMONSTRATION     -   ANSWERED BY PATIENT   RELATIONSHIP SELF     Immunization/s administered 04/16/2018 by Zollie ScaleUnique Keith Cancio, LPN with patient's consent.     Patient tolerated procedure well.  No reactions noted.  VIS provided.

## 2018-04-20 ENCOUNTER — Encounter: Attending: Rheumatology | Primary: Internal Medicine

## 2018-04-20 LAB — HEMOGLOBIN A1C W/EAG
Hemoglobin A1C: 5.6 % (ref 4.8–5.6)
eAG: 114 mg/dL

## 2018-04-20 LAB — PSA W/ REFLX FREE PSA
PSA: 0.5 ng/mL (ref 0.0–4.0)
Prostate Specific Ag: 0.5 ng/mL (ref 0.0–4.0)

## 2018-04-20 LAB — CK
Creatine Kinase,Total: 144 U/L (ref 24–204)
Total CK: 144 U/L (ref 24–204)

## 2018-04-20 LAB — VITAMIN B12 & FOLATE
Folate: >20 ng/mL (ref >3.0)
Folate: >20 ng/mL (ref >3.0)
Vitamin B-12: 324 pg/mL (ref 232–1245)
Vitamin B12: 324 pg/mL (ref 232–1245)

## 2018-04-20 LAB — HEMOGLOBIN A1C WITH EAG
Estimated average glucose: 114 mg/dL
Hemoglobin A1c: 5.6 % (ref 4.8–5.6)

## 2018-04-20 NOTE — Telephone Encounter (Signed)
Spoke to [facility] informed Timothy LassoZack of pt diagnosis code per Dr Allena KatzPatel note, Timothy LassoZack verbally acknowledged understanding

## 2018-04-20 NOTE — Telephone Encounter (Signed)
-----   Message from Alvira MondayBecky L Hughes, RN sent at 04/20/2018  3:19 PM EDT -----  Regarding: FW: Dr. Aaron MosePatel/Telephone      ----- Message -----  From: Alvira PhilipsBacon, Renarda S  Sent: 04/20/2018   2:51 PM  To: [facility] Nurse Pool  Subject: Dr. Lianne CurePatel/Telephone                              [facility]) is requesting a call back from Dr. Allena KatzPatel or nurse in reference to medication "secukinumab (COSENTYX PEN, 2 PENS,) 150 mg/mL". Needs diagnosis code.    Eliane best contact [phone]

## 2018-04-30 NOTE — Progress Notes (Signed)
Faxed prior auth request to [employer] for Cocentyx 300 mg.

## 2018-04-30 NOTE — Progress Notes (Signed)
Faxed prior auth request to [employer] for Cocentyx 300 mg.

## 2018-05-11 ENCOUNTER — Encounter: Payer: Self-pay | Admitting: Pharmacist

## 2018-05-11 NOTE — Progress Notes (Signed)
New Patient to [facility]

## 2018-05-13 ENCOUNTER — Encounter: Payer: Self-pay | Admitting: Pharmacist

## 2018-05-13 ENCOUNTER — Ambulatory Visit (INDEPENDENT_AMBULATORY_CARE_PROVIDER_SITE_OTHER): Payer: Self-pay | Admitting: Pharmacist

## 2018-05-13 ENCOUNTER — Other Ambulatory Visit: Payer: Self-pay | Admitting: Pharmacist

## 2018-05-13 DIAGNOSIS — Z79899 Other long term (current) drug therapy: Secondary | ICD-10-CM

## 2018-05-13 MED ORDER — SECUKINUMAB 150 MG/ML ~~LOC~~ SOAJ: 300.0000 mg | SUBCUTANEOUS | 5 refills | Status: DC

## 2018-05-13 NOTE — Progress Notes (Signed)
   S: Patient presents today to the [facility] for review of their specialty medication.   Patient is currently taking Cosentyx for psoriatic arthritis. Patient is managed by Dr. Posey Pronto for this but is looking for a new rheumatologist in the area.   Patient recently moved here from [place], [place]. 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   Adherence: denies any missed doses  Monitoring/Current adverse effects: S/sx of infection: denies GI upset: denies S/sx of hypersensitivity: denies  He would like to know how to transfer his medications to our pharmacies and what the copays will likely be.  O:     No results found for: WBC, HGB, HCT, MCV, PLT    Chemistry   No results found for: NA, K, CL, CO2, BUN, CREATININE, GLU No results found for: CALCIUM, ALKPHOS, AST, ALT, BILITOT     A/P: 1. Medication review: Patient is on Cosentyx for psoriatic arthritis and is tolerating it well with no adverse effects. Reviewed the medication with him, including the following: Cosentyx an antibody used to treat inflammatory diseases, such as psoriasis and psoriatic arthritis. While on the medication, the patient is at an increased risk of infection. Patient should let rheumatologist know of any s/sx of infection. Possible adverse effects include hypersensitivity reactions, infection, and GI upset. Patient aware of appropriate injection technique.  Information provided on local rheumatologists as well as our outpatient pharmacies and estimated copays. No recommendation for any changes.   Christella Hartigan, PharmD, BCPS, BCACP, CPP Clinical Pharmacist Practitioner  [phone]

## 2018-06-01 MED FILL — PANTOPRAZOLE SOD DR 40 MG T: 40 | 90 days supply | Qty: 90 | Fill #0

## 2018-06-29 MED ORDER — SECUKINUMAB 150 MG/ML SUBCUTANEOUS PEN INJECTOR
150 mg/mL | SUBCUTANEOUS | 6 refills | Status: AC
Start: 2018-06-29 — End: ?

## 2018-06-29 MED FILL — ATORVASTATIN 80 MG TABLET: 80 | 90 days supply | Qty: 90 | Fill #0

## 2018-06-30 ENCOUNTER — Other Ambulatory Visit: Payer: Self-pay | Admitting: Pharmacist

## 2018-06-30 MED ORDER — SECUKINUMAB 150 MG/ML ~~LOC~~ SOAJ: 300.0000 mg | SUBCUTANEOUS | 6 refills | Status: DC

## 2018-06-30 NOTE — Progress Notes (Signed)
Prior auth request to [employer] done verbally spoke with Yani at [employer], and faxed last office note for review Case # [id number]. Patient notified thru MY Chart.

## 2018-06-30 NOTE — Progress Notes (Signed)
Prior auth request to [employer] done verbally spoke with Charna ElizabethYani at [employer], and faxed last office note for review Case # [id number]. [facility].

## 2018-07-01 MED FILL — COSENTYX 300 MG DOSE-2 PENS: 150 | 28 days supply | Qty: 2 | Fill #0

## 2018-07-02 NOTE — Progress Notes (Signed)
Spoke with Morrie SheldonAshley at [facility], and she states Cosentyx 300 mg Pen is approved until 07/01/2019, [id number]. Sent patient a message thru My Chart.

## 2018-07-02 NOTE — Progress Notes (Signed)
Spoke with Ashley at [employer], and she states Cosentyx 300 mg Pen is approved until 07/01/2019, [id number]. Sent patient a message thru My Chart.

## 2018-07-15 DIAGNOSIS — Z955 Presence of coronary angioplasty implant and graft: Secondary | ICD-10-CM | POA: Diagnosis not present

## 2018-07-15 DIAGNOSIS — K219 Gastro-esophageal reflux disease without esophagitis: Secondary | ICD-10-CM | POA: Diagnosis not present

## 2018-07-15 DIAGNOSIS — Z8249 Family history of ischemic heart disease and other diseases of the circulatory system: Secondary | ICD-10-CM | POA: Diagnosis not present

## 2018-07-15 DIAGNOSIS — Z79899 Other long term (current) drug therapy: Secondary | ICD-10-CM | POA: Diagnosis not present

## 2018-07-15 DIAGNOSIS — L4050 Arthropathic psoriasis, unspecified: Secondary | ICD-10-CM | POA: Diagnosis not present

## 2018-07-15 DIAGNOSIS — E782 Mixed hyperlipidemia: Secondary | ICD-10-CM | POA: Diagnosis not present

## 2018-07-15 DIAGNOSIS — R079 Chest pain, unspecified: Secondary | ICD-10-CM | POA: Diagnosis not present

## 2018-07-15 DIAGNOSIS — M546 Pain in thoracic spine: Secondary | ICD-10-CM | POA: Diagnosis not present

## 2018-07-15 DIAGNOSIS — I2510 Atherosclerotic heart disease of native coronary artery without angina pectoris: Secondary | ICD-10-CM | POA: Diagnosis not present

## 2018-08-19 MED FILL — METOPROLOL SUCCINATE ER 50: 50 | 90 days supply | Qty: 90 | Fill #0

## 2018-08-19 MED FILL — BRILINTA 90 MG TABLET: 90 | 90 days supply | Qty: 180 | Fill #0

## 2018-08-19 MED FILL — PANTOPRAZOLE SOD DR 40 MG T: 40 | 90 days supply | Qty: 90 | Fill #1

## 2018-08-23 MED FILL — COSENTYX 300 MG DOSE-2 PENS: 150 | 28 days supply | Qty: 2 | Fill #1

## 2018-09-20 ENCOUNTER — Encounter

## 2018-09-20 MED FILL — ATORVASTATIN 80 MG TABLET: 80 | 90 days supply | Qty: 90 | Fill #1

## 2018-09-21 MED FILL — COSENTYX 300 MG DOSE-2 PENS: 150 | 28 days supply | Qty: 2 | Fill #2

## 2018-09-27 MED ORDER — ATOVAQUONE-PROGUANIL 250 MG-100 MG TAB
250-100 mg | ORAL_TABLET | Freq: Every day | ORAL | 0 refills | Status: AC
Start: 2018-09-27 — End: ?

## 2018-09-27 MED FILL — ATOVAQUONE-PROGUANIL 250-10: 250-100 | 25 days supply | Qty: 25 | Fill #0

## 2018-09-27 NOTE — Telephone Encounter (Signed)
Refill request(s) approved--Malarone--for malaria prevention with travel--prior travel pt here.      Pt Comment:    Patient Comment: Dr. Irene Paphildress,I am in the process of finding my PCP. Please prescribe me this medication. I am traveling to [place] and will be there for 16 days. Need medication for at least 23 to 25 tabs. Appreciate your help. Thanks

## 2018-10-25 MED FILL — COSENTYX 300 MG DOSE-2 PENS: 150 | 28 days supply | Qty: 2 | Fill #3

## 2018-11-17 NOTE — Progress Notes (Signed)
Office Visit Note  Patient: Ricardo [name]             Date of Birth: 03/05/1956           MRN: [id number]             PCP: Patient, No Pcp Per Referring: Tama High, MD Visit Date: 11/19/2018 Occupation: OBGYN  Subjective:  Psoriasis and wrist joint pain   History of Present Illness: Ricardo [name] is a 63 y.o. male self-referred himself for evaluation of psoriatic arthritis.  According to patient in 1997 he developed psoriasis on his scalp.  In 98 he developed right wrist joint pain.  He states in 1999 he was seen by rheumatologist who diagnosed him with psoriatic arthritis and was treated with Celebrex on PRN basis.  In 2002 he started having worsening of symptoms with DIP swelling right wrist joint swelling, neck pain and costochondritis.  He was started on methotrexate which he took for about 5 years.  As he had an adequate response to methotrexate in 2007 he was switched to Enbrel.  He states he he had good response to Enbrel.  He started spacing Enbrel every other week.  In 2016 his psoriasis got worse.  At the time Enbrel was changed to every week but it did not work very well.  He took Humira for few months with inadequate response.  In November 2017 he was switched to Cosentyx.  He states he has done really well on Cosentyx with occasional psoriasis patches which respond to clobetasol.  He continues to have some right wrist joint discomfort.  He denies any joint swelling.  He has some neck is stiffness.  He has occasional TMJ discomfort.  Patient reports history of plantar fasciitis about 1 year ago which resolved.  Activities of Daily Living:  Patient reports morning stiffness for 0 none.   Patient Denies nocturnal pain.  Difficulty dressing/grooming: Denies Difficulty climbing stairs: Denies Difficulty getting out of chair: Denies Difficulty using hands for taps, buttons, cutlery, and/or writing: Denies  Review of Systems  Constitutional: Negative for fatigue and night sweats.    HENT: Negative for mouth sores, mouth dryness and nose dryness.   Eyes: Negative for redness and dryness.  Respiratory: Negative for shortness of breath and difficulty breathing.   Cardiovascular: Negative for chest pain, palpitations, hypertension, irregular heartbeat and swelling in legs/feet.  Gastrointestinal: Negative for constipation and diarrhea.  Endocrine: Negative for increased urination.  Genitourinary: Negative for difficulty urinating.  Musculoskeletal: Positive for arthralgias and joint pain. Negative for joint swelling, myalgias, muscle weakness, morning stiffness, muscle tenderness and myalgias.  Skin: Positive for rash. Negative for color change, hair loss, nodules/bumps, skin tightness, ulcers and sensitivity to sunlight.       Psoriasis  Allergic/Immunologic: Negative for susceptible to infections.  Neurological: Negative for dizziness, fainting, numbness, memory loss, night sweats and weakness ( ).  Hematological: Negative for bruising/bleeding tendency and swollen glands.  Psychiatric/Behavioral: Negative for depressed mood and sleep disturbance. The patient is not nervous/anxious.     PMFS History:  Patient Active Problem List   Diagnosis Date Noted  . History of coronary artery disease 11/19/2018  . Current use of anticoagulant therapy 11/19/2018  . Psoriasis 11/19/2018  . Psoriatic arthritis ([place]) 11/19/2018  . High risk medication use 11/19/2018  . DDD (degenerative disc disease), cervical 11/19/2018    Past Medical History:  Diagnosis Date  . Coronary heart disease    2 stent placed March 2019  . H/O heart  artery stent   . History of seizures    No seizures in over 20 years.   . Psoriasis   . Psoriatic arthritis ([place])    On Cosentyx since 2017    Family History  Problem Relation Age of Onset  . Heart disease Father    Past Surgical History:  Procedure Laterality Date  . COLONOSCOPY  2007 & 2016   Normal  . CYSTOSCOPY Right 2009   Removal of  Ureteric Stone  . HEMORRHOID SURGERY  2004   External hemorrhoid removed   . HERNIA REPAIR    . INGUINAL HERNIA REPAIR  1976  . PILONIDAL CYST / SINUS EXCISION  2001  . THORACOTOMY  1997   and biopsy  . UPPER GI ENDOSCOPY  2007 & 2016   Normal   Social History   Social History Narrative  . Not on file    There is no immunization history on file for this patient.   Objective: Vital Signs: BP (!) 100/56 (BP Location: Right Arm, Patient Position: Sitting, Cuff Size: Normal)   Pulse 72   Resp 16   Ht 5\' 8"  (1.727 m)   Wt 154 lb (69.9 kg)   BMI 23.42 kg/m    Physical Exam Vitals signs and nursing note reviewed.  Constitutional:      Appearance: He is well-developed.  HENT:     Head: Normocephalic and atraumatic.  Eyes:     Conjunctiva/sclera: Conjunctivae normal.     Pupils: Pupils are equal, round, and reactive to light.  Neck:     Musculoskeletal: Normal range of motion and neck supple.  Cardiovascular:     Rate and Rhythm: Normal rate and regular rhythm.     Heart sounds: Normal heart sounds.  Pulmonary:     Effort: Pulmonary effort is normal.     Breath sounds: Normal breath sounds.  Abdominal:     General: Bowel sounds are normal.     Palpations: Abdomen is soft.  Skin:    General: Skin is warm and dry.     Capillary Refill: Capillary refill takes less than 2 seconds.  Neurological:     Mental Status: He is alert and oriented to person, place, and time.  Psychiatric:        Behavior: Behavior normal.      Musculoskeletal Exam: C-spine limited range of motion.  He has some thoracic kyphosis.  He has limited range of motion of his lumbar spine.  No SI joint tenderness was noted.  Shoulder joints elbow joints wrist joint MCPs PIPs DIPs with good range of motion with no synovitis.  Hip joints knee joints ankles MTPs PIPs been good range of motion with no synovitis.  He had mild tenderness over trochanteric bursa.  CDAI Exam: CDAI Score: Not documented Patient  Global Assessment: Not documented; Provider Global Assessment: Not documented Swollen: Not documented; Tender: Not documented Joint Exam   Not documented   There is currently no information documented on the homunculus. Go to the Rheumatology activity and complete the homunculus joint exam.  Investigation: No additional findings.  Imaging: Xr Cervical Spine 2 Or 3 Views  Result Date: 11/19/2018 Multilevel spondylosis was noted.  C4-5, C5-6 and C6-7 significant narrowing was noted.  Mild facet joint arthropathy was noted. Impression: Multilevel spondylosis and mild facet joint arthropathy was noted.  Xr Foot 2 Views Left  Result Date: 11/19/2018 First MTP, PIP and DIP narrowing was noted.  No erosive changes were noted.  No intertarsal subtalar joint  space narrowing was noted.  Small calcaneal spur was noted. Impression: Mild osteoarthritis of the foot was noted.  Xr Foot 2 Views Right  Result Date: 11/19/2018 First MTP, PIP and DIP narrowing was noted.  No erosive changes were noted.  No intertarsal subtalar joint space narrowing was noted.  Small calcaneal spur was noted. Impression: Mild osteoarthritis of the foot was noted.  Xr Hand 2 View Left  Result Date: 11/19/2018 Minimal PIP and DIP narrowing was noted.  No MCP joint changes were noted.  Intercarpal and radiocarpal joint space narrowing was noted.  Erosive changes noted in the ulnar styloid and carpal bones.. 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  Xr Hand 2 View Right  Result Date: 11/19/2018 PIP and DIP narrowing was noted.  Third PIP significant narrowing was noted.  Erosive changes noted in the second MCP joint third MCP joint and fifth MCP joint.  Intercarpal and radiocarpal narrowing was noted. 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   Recent Labs: No results found for: WBC, HGB, PLT, NA, K,  CL, CO2, GLUCOSE, BUN, CREATININE, BILITOT, ALKPHOS, AST, ALT, PROT, ALBUMIN, CALCIUM, GFRAA, QFTBGOLD, QFTBGOLDPLUS  Speciality Comments: No specialty comments available.  Procedures:  No procedures performed Allergies: Patient has no allergy information on record.   Assessment / Plan:     Visit Diagnoses: Psoriatic arthritis ([place]) - diagnosed in 1999, treated by Dr. Kerry Hough in [place] -he had inadequate response to methotrexate, Enbrel, Humira.  He has been on Cosentyx since November 2017.  He had very good response to Cosentyx with minimal rash and no active synovitis.  I will obtain some baseline x-rays today.  Plan: XR Hand 2 View Right, XR Hand 2 View Left, XR Foot 2 Views Right, XR Foot 2 Views Left.  He will continue Cosentyx.  Indications side effects contraindications were reviewed.  Informed consent was obtained.  We will call in prescription for Cosentyx 300 mg subcu monthly once labs are available.  X-ray of bilateral hands reveal erosive changes.    Pain in right wrist-he continues to have right wrist joint discomfort.  X-rays today revealed narrowing of the intercarpal joints and radiocarpal joint space narrowing.  He also had erosive changes.  No comparison x-rays were available.  Psoriasis -he uses topical clobetasol  High risk medication use - Cosentyx 300 mg sq monthly injections (started 10/2016)Inadquate response to Enbrel and Humira - Plan: CBC with Differential/Platelet, COMPLETE METABOLIC PANEL WITH GFR, QuantiFERON-TB Gold Plus, Serum protein electrophoresis with reflex, HIV Antibody (routine testing w rflx).  Patient states he will send [place] hepatitis B and C results.  Neck pain -he has limited range of motion of cervical spine.  Plan: XR Cervical Spine 2 or 3 views.  Multilevel spondylosis was noted.  Handout on neck exercises was given.  History of coronary artery disease - Status post stent placement March 2019  Current use of anticoagulant therapy   Orders: Orders  Placed This Encounter  Procedures  . XR Hand 2 View Right  . XR Hand 2 View Left  . XR Foot 2 Views Right  . XR Foot 2 Views Left  . XR Cervical Spine 2 or 3 views  . CBC with Differential/Platelet  . COMPLETE METABOLIC PANEL WITH GFR  . Serum protein electrophoresis with reflex  . HIV Antibody (routine testing w rflx)   No orders of the defined types were placed in this encounter.  Follow-Up Instructions: Return in about 6 months (around 05/20/2019) for Psoriatic arthritis, psoriasis.   Bo Merino, MD  Note - This record has been created using Editor, commissioning.  Chart creation errors have been sought, but may not always  have been located. Such creation errors do not reflect on  the standard of medical care.

## 2018-11-19 ENCOUNTER — Ambulatory Visit (INDEPENDENT_AMBULATORY_CARE_PROVIDER_SITE_OTHER): Payer: 59 | Admitting: Rheumatology

## 2018-11-19 ENCOUNTER — Ambulatory Visit (INDEPENDENT_AMBULATORY_CARE_PROVIDER_SITE_OTHER): Payer: Self-pay

## 2018-11-19 ENCOUNTER — Telehealth: Payer: Self-pay | Admitting: Pharmacist

## 2018-11-19 ENCOUNTER — Encounter: Payer: Self-pay | Admitting: Rheumatology

## 2018-11-19 VITALS — BP 100/56 | HR 72 | Resp 16 | Ht 68.0 in | Wt 154.0 lb

## 2018-11-19 DIAGNOSIS — M25542 Pain in joints of left hand: Secondary | ICD-10-CM | POA: Diagnosis not present

## 2018-11-19 DIAGNOSIS — M25571 Pain in right ankle and joints of right foot: Secondary | ICD-10-CM | POA: Diagnosis not present

## 2018-11-19 DIAGNOSIS — M5030 Other cervical disc degeneration, unspecified cervical region: Secondary | ICD-10-CM

## 2018-11-19 DIAGNOSIS — Z8679 Personal history of other diseases of the circulatory system: Secondary | ICD-10-CM | POA: Insufficient documentation

## 2018-11-19 DIAGNOSIS — L4050 Arthropathic psoriasis, unspecified: Secondary | ICD-10-CM | POA: Diagnosis not present

## 2018-11-19 DIAGNOSIS — Z7901 Long term (current) use of anticoagulants: Secondary | ICD-10-CM | POA: Insufficient documentation

## 2018-11-19 DIAGNOSIS — M25541 Pain in joints of right hand: Secondary | ICD-10-CM | POA: Diagnosis not present

## 2018-11-19 DIAGNOSIS — L409 Psoriasis, unspecified: Secondary | ICD-10-CM

## 2018-11-19 DIAGNOSIS — M542 Cervicalgia: Secondary | ICD-10-CM | POA: Diagnosis not present

## 2018-11-19 DIAGNOSIS — M25572 Pain in left ankle and joints of left foot: Secondary | ICD-10-CM | POA: Diagnosis not present

## 2018-11-19 DIAGNOSIS — Z79899 Other long term (current) drug therapy: Secondary | ICD-10-CM | POA: Diagnosis not present

## 2018-11-19 HISTORY — DX: Psoriasis, unspecified: L40.9

## 2018-11-19 HISTORY — DX: Other cervical disc degeneration, unspecified cervical region: M50.30

## 2018-11-19 HISTORY — DX: Personal history of other diseases of the circulatory system: Z86.79

## 2018-11-19 NOTE — Patient Instructions (Addendum)
Standing Labs We placed an order today for your standing lab work.    Please come back and get your standing labs in April and every 3 months  We have open lab Monday through Friday from 8:30-11:30 AM and 1:30-4:00 PM  at the office of Dr. Bo Merino.   You may experience shorter wait times on Monday and Friday afternoons. The office is located at [address], [place], [place], [place] [address] No appointment is necessary.   Labs are drawn by [facility].  You may receive a bill from [facility] for your lab work.  If you wish to have your labs drawn at another location, please call the office 24 hours in advance to send orders.  If you have any questions regarding directions or hours of operation,  please call [phone].   Just as a reminder please drink plenty of water prior to coming for your lab work. Thanks!     Secukinumab injection What is this medicine? SECUKINUMAB (sek ue KIN ue mab) is used to treat psoriasis. It is also used to treat psoriatic arthritis and ankylosing spondylitis. This medicine may be used for other purposes; ask your health care provider or pharmacist if you have questions. COMMON BRAND NAME(S): Cosentyx What should I tell my health care provider before I take this medicine? They need to know if you have any of these conditions: -Crohn's disease, ulcerative colitis, or other inflammatory bowel disease -infection or history of infection -other conditions affecting the immune system -recently received or are scheduled to receive a vaccine -tuberculosis, a positive skin test for tuberculosis, or have recently been in close contact with someone who has tuberculosis -an unusual or allergic reaction to secukinumab, other medicines, latex, rubber, foods, dyes, or preservatives -pregnant or trying to get pregnant -breast-feeding How should I use this medicine? This medicine is for injection under the skin. It may be administered by a healthcare  professional in a hospital or clinic setting or at home. If you get this medicine at home, you will be taught how to prepare and give this medicine. Use exactly as directed. Take your medicine at regular intervals. Do not take your medicine more often than directed. It is important that you put your used needles and syringes in a special sharps container. Do not put them in a trash can. If you do not have a sharps container, call your pharmacist or healthcare provider to get one. A special MedGuide will be given to you by the pharmacist with each prescription and refill. Be sure to read this information carefully each time. Talk to your pediatrician regarding the use of this medicine in children. Special care may be needed. Overdosage: If you think you have taken too much of this medicine contact a poison control center or emergency room at once. NOTE: This medicine is only for you. Do not share this medicine with others. What if I miss a dose? It is important not to miss your dose. Call your doctor of health care professional if you are unable to keep an appointment. If you give yourself the medicine and you miss a dose, take it as soon as you can. If it is almost time for your next dose, take only that dose. Do not take double or extra doses. What may interact with this medicine? Do not take this medicine with any of the following medications: -live virus vaccines This medicine may also interact with the following medications: -cyclosporine -inactivated vaccines -warfarin This list may not describe  all possible interactions. Give your health care provider a list of all the medicines, herbs, non-prescription drugs, or dietary supplements you use. Also tell them if you smoke, drink alcohol, or use illegal drugs. Some items may interact with your medicine. What should I watch for while using this medicine? Tell your doctor or healthcare professional if your symptoms do not start to get better or if  they get worse. You will be tested for tuberculosis (TB) before you start this medicine. If your doctor prescribes any medicine for TB, you should start taking the TB medicine before starting this medicine. Make sure to finish the full course of TB medicine. Call your doctor or healthcare professional for advice if you get a fever, chills or sore throat, or other symptoms of a cold or flu. Do not treat yourself. This drug decreases your body's ability to fight infections. Try to avoid being around people who are sick. This medicine can decrease the response to a vaccine. If you need to get vaccinated, tell your healthcare professional if you have received this medicine within the last 6 months. Extra booster doses may be needed. Talk to your doctor to see if a different vaccination schedule is needed. What side effects may I notice from receiving this medicine? Side effects that you should report to your doctor or health care professional as soon as possible: -allergic reactions like skin rash, itching or hives, swelling of the face, lips, or tongue -signs and symptoms of infection like fever or chills; cough; sore throat; pain or trouble passing urine Side effects that usually do not require medical attention (report to your doctor or health care professional if they continue or are bothersome): -diarrhea This list may not describe all possible side effects. Call your doctor for medical advice about side effects. You may report side effects to FDA at 1-800-FDA-1088. Where should I keep my medicine? Keep out of the reach of children. Store the prefilled syringe or injection pen in a refrigerator between 2 to 8 degrees C (36 to 46 degrees F). Keep the syringe or the pen in the original carton until ready for use. Protect from light. Do not freeze. Do not shake. Prior to use, remove the syringe or pen from the refrigerator and use within 1 hour. Throw away any unused medicine after the expiration date on  the label. NOTE: This sheet is a summary. It may not cover all possible information. If you have questions about this medicine, talk to your doctor, pharmacist, or health care provider.  2019 Elsevier/Gold Standard (2015-11-22 11:48:31)    Cervical Strain and Sprain Rehab Ask your health care provider which exercises are safe for you. Do exercises exactly as told by your health care provider and adjust them as directed. It is normal to feel mild stretching, pulling, tightness, or discomfort as you do these exercises, but you should stop right away if you feel sudden pain or your pain gets worse.Do not begin these exercises until told by your health care provider. Stretching and range of motion exercises These exercises warm up your muscles and joints and improve the movement and flexibility of your neck. These exercises also help to relieve pain, numbness, and tingling. Exercise A: Cervical side bend  1. Using good posture, sit on a stable chair or stand up. 2. Without moving your shoulders, slowly tilt your left / right ear to your shoulder until you feel a stretch in your neck muscles. You should be looking straight ahead. 3. Hold for  __________ seconds. 4. Repeat with the other side of your neck. Repeat __________ times. Complete this exercise __________ times a day. Exercise B: Cervical rotation  1. Using good posture, sit on a stable chair or stand up. 2. Slowly turn your head to the side as if you are looking over your left / right shoulder. ? Keep your eyes level with the ground. ? Stop when you feel a stretch along the side and the back of your neck. 3. Hold for __________ seconds. 4. Repeat this by turning to your other side. Repeat __________ times. Complete this exercise __________ times a day. Exercise C: Thoracic extension and pectoral stretch 1. Roll a towel or a small blanket so it is about 4 inches (10 cm) in diameter. 2. Lie down on your back on a firm surface. 3. Put  the towel lengthwise, under your spine in the middle of your back. It should not be not under your shoulder blades. The towel should line up with your spine from your middle back to your lower back. 4. Put your hands behind your head and let your elbows fall out to your sides. 5. Hold for __________ seconds. Repeat __________ times. Complete this exercise __________ times a day. Strengthening exercises These exercises build strength and endurance in your neck. Endurance is the ability to use your muscles for a long time, even after your muscles get tired. Exercise D: Upper cervical flexion, isometric 1. Lie on your back with a thin pillow behind your head and a small rolled-up towel under your neck. 2. Gently tuck your chin toward your chest and nod your head down to look toward your feet. Do not lift your head off the pillow. 3. Hold for __________ seconds. 4. Release the tension slowly. Relax your neck muscles completely before you repeat this exercise. Repeat __________ times. Complete this exercise __________ times a day. Exercise E: Cervical extension, isometric  1. Stand about 6 inches (15 cm) away from a wall, with your back facing the wall. 2. Place a soft object, about 6-8 inches (15-20 cm) in diameter, between the back of your head and the wall. A soft object could be a small pillow, a ball, or a folded towel. 3. Gently tilt your head back and press into the soft object. Keep your jaw and forehead relaxed. 4. Hold for __________ seconds. 5. Release the tension slowly. Relax your neck muscles completely before you repeat this exercise. Repeat __________ times. Complete this exercise __________ times a day. Posture and body mechanics Body mechanics refers to the movements and positions of your body while you do your daily activities. Posture is part of body mechanics. Good posture and healthy body mechanics can help to relieve stress in your body's tissues and joints. Good posture means  that your spine is in its natural S-curve position (your spine is neutral), your shoulders are pulled back slightly, and your head is not tipped forward. The following are general guidelines for applying improved posture and body mechanics to your everyday activities. Standing   When standing, keep your spine neutral and keep your feet about hip-width apart. Keep a slight bend in your knees. Your ears, shoulders, and hips should line up.  When you do a task in which you stand in one place for a long time, place one foot up on a stable object that is 2-4 inches (5-10 cm) high, such as a footstool. This helps keep your spine neutral. Sitting   When sitting, keep your spine neutral  and your keep feet flat on the floor. Use a footrest, if necessary, and keep your thighs parallel to the floor. Avoid rounding your shoulders, and avoid tilting your head forward.  When working at a desk or a computer, keep your desk at a height where your hands are slightly lower than your elbows. Slide your chair under your desk so you are close enough to maintain good posture.  When working at a computer, place your monitor at a height where you are looking straight ahead and you do not have to tilt your head forward or downward to look at the screen. Resting When lying down and resting, avoid positions that are most painful for you. Try to support your neck in a neutral position. You can use a contour pillow or a small rolled-up towel. Your pillow should support your neck but not push on it. This information is not intended to replace advice given to you by your health care provider. Make sure you discuss any questions you have with your health care provider. Document Released: 10/20/2005 Document Revised: 06/26/2016 Document Reviewed: 09/26/2015 Elsevier Interactive Patient Education  2019 [employer].

## 2018-11-19 NOTE — Telephone Encounter (Signed)
Patient dose will be 300 mg every 28 days.  Prescription pending labs.

## 2018-11-19 NOTE — Progress Notes (Signed)
Pharmacy Note  Subjective:  Patient presents today to the [facility] to see Dr. Estanislado Pandy.  Patient was seen by the pharmacist for counseling on Cosentyx for psoriatic arthritis.  He is continuing therapy started by another rheumatology office.  Objective:  CBC: pending 11/19/2018 CMP: pending 11/19/2018  Baseline Immunosuppressant Therapy Labs TB gold: negative 05/01/18 Hepatitis B negative 11/28/99 Hepatitis C negative 02/19/16 [id number] 11/19/2018 Immunoglobulins: pending 11/19/2018 SPEP: pending 11/19/2018  Does patient have a history of inflammatory bowel disease? No  Assessment/Plan:  Counseled patient that Cosentyx is a IL-17 inhibitor that works to reduce pain and inflammation associated with arthritis.  Counseled patient on purpose, proper use, and adverse effects of Cosentyx.  Reviewed the most common adverse effects of infection, inflammatory bowel disease, and allergic reaction.  Counseled patient that Cosentyx should be held prior to scheduled surgery.  Counseled patient to avoid live vaccines while on Cosentyx.  Recommend annual influenza, Pneumovax 23, Prevnar 13, and Shingrix as indicated.   Reviewed the importance of regular labs while on Cosentyx. Standing orders placed. Provided patient with medication education material and answered all questions.  Patient consented to Cosentyx.  Will upload consent into patient's chart.  Will apply for Cosentyx through patient's insurance.  Reviewed storage information for Cosentyx.  Advised initial injection must be administered in office.  Patient voiced understanding.    Patient dose will be 300 mg every 28 days.  Prescription pending labs.  All questions encouraged and answered.  Instructed patient to call with any further questions or concerns.  Mariella Saa, PharmD, [facility] Rheumatology Clinical Pharmacist  11/19/2018 11:38 AM

## 2018-11-22 ENCOUNTER — Encounter: Payer: Self-pay | Admitting: Pharmacist

## 2018-11-22 ENCOUNTER — Other Ambulatory Visit: Payer: Self-pay | Admitting: Pharmacist

## 2018-11-22 LAB — CBC WITH DIFFERENTIAL/PLATELET
Absolute Monocytes: 672 cells/uL (ref 200–950)
Basophils Absolute: 110 cells/uL (ref 0–200)
Basophils Relative: 1.5 %
Eosinophils Absolute: 606 cells/uL — ABNORMAL HIGH (ref 15–500)
Eosinophils Relative: 8.3 %
HCT: 39.4 % (ref 38.5–50.0)
Hemoglobin: 13.2 g/dL (ref 13.2–17.1)
Lymphs Abs: 2000 cells/uL (ref 850–3900)
MCH: 30.9 pg (ref 27.0–33.0)
MCHC: 33.5 g/dL (ref 32.0–36.0)
MCV: 92.3 fL (ref 80.0–100.0)
MPV: 11.1 fL (ref 7.5–12.5)
Monocytes Relative: 9.2 %
NEUTROS ABS: 3913 {cells}/uL (ref 1500–7800)
Neutrophils Relative %: 53.6 %
Platelets: 233 10*3/uL (ref 140–400)
RBC: 4.27 10*6/uL (ref 4.20–5.80)
RDW: 12 % (ref 11.0–15.0)
Total Lymphocyte: 27.4 %
WBC: 7.3 10*3/uL (ref 3.8–10.8)

## 2018-11-22 LAB — COMPLETE METABOLIC PANEL WITH GFR
AG Ratio: 1.4 (calc) (ref 1.0–2.5)
ALT: 22 U/L (ref 9–46)
AST: 24 U/L (ref 10–35)
Albumin: 4.3 g/dL (ref 3.6–5.1)
Alkaline phosphatase (APISO): 59 U/L (ref 40–115)
BUN: 16 mg/dL (ref 7–25)
CALCIUM: 9.5 mg/dL (ref 8.6–10.3)
CO2: 31 mmol/L (ref 20–32)
Chloride: 104 mmol/L (ref 98–110)
Creat: 0.83 mg/dL (ref 0.70–1.25)
GFR, Est African American: 109 mL/min/{1.73_m2} (ref >=60)
GFR, Est Non African American: 94 mL/min/{1.73_m2} (ref >=60)
Globulin: 3 g/dL (calc) (ref 1.9–3.7)
Glucose, Bld: 72 mg/dL (ref 65–99)
Potassium: 4.1 mmol/L (ref 3.5–5.3)
Sodium: 140 mmol/L (ref 135–146)
Total Bilirubin: 0.5 mg/dL (ref 0.2–1.2)
Total Protein: 7.3 g/dL (ref 6.1–8.1)

## 2018-11-22 LAB — PROTEIN ELECTROPHORESIS, SERUM, WITH REFLEX
Albumin ELP: 4.3 g/dL (ref 3.8–4.8)
Alpha 1: 0.3 g/dL (ref 0.2–0.3)
Alpha 2: 0.6 g/dL (ref 0.5–0.9)
Beta 2: 0.5 g/dL (ref 0.2–0.5)
Beta Globulin: 0.5 g/dL (ref 0.4–0.6)
Gamma Globulin: 1.2 g/dL (ref 0.8–1.7)
Total Protein: 7.5 g/dL (ref 6.1–8.1)

## 2018-11-22 LAB — HIV ANTIBODY (ROUTINE TESTING W REFLEX): HIV 1&2 Ab, 4th Generation: NONREACTIVE

## 2018-11-22 MED ORDER — SECUKINUMAB 150 MG/ML ~~LOC~~ SOAJ: 300.0000 mg | SUBCUTANEOUS | 0 refills | Status: DC

## 2018-11-22 MED ORDER — SECUKINUMAB (300 MG DOSE) 150 MG/ML ~~LOC~~ SOAJ: 300.0000 mg | SUBCUTANEOUS | 0 refills | Status: DC

## 2018-11-22 MED FILL — COSENTYX 300 MG DOSE-2 PENS: 150 | 28 days supply | Qty: 2 | Fill #4

## 2018-11-22 NOTE — Telephone Encounter (Signed)
Labs have resulted and prescription sent to the pharmacy.

## 2018-11-29 MED FILL — PANTOPRAZOLE SOD DR 40 MG T: 40 | 90 days supply | Qty: 90 | Fill #2

## 2018-11-30 MED FILL — BRILINTA 90 MG TABLET: 90 | 90 days supply | Qty: 180 | Fill #1

## 2018-12-13 MED FILL — ATORVASTATIN CALCIUM 80 MG: 80 | 90 days supply | Qty: 90 | Fill #2

## 2018-12-23 MED FILL — COSENTYX 300 MG DOSE-2 PENS: 150 | 28 days supply | Qty: 2 | Fill #0

## 2019-01-20 MED FILL — COSENTYX 300 MG DOSE-2 PENS: 150 | 28 days supply | Qty: 2 | Fill #1

## 2019-02-03 ENCOUNTER — Telehealth: Payer: Self-pay

## 2019-02-03 ENCOUNTER — Telehealth: Payer: Self-pay | Admitting: Rheumatology

## 2019-02-03 NOTE — Telephone Encounter (Signed)
[name] request a call from Dr. Estanislado Pandy to discuss the medication he is on, and working with patients. Please call to discuss.

## 2019-02-03 NOTE — Telephone Encounter (Signed)
Virtual Visit Pre-Appointment Phone Call    TELEPHONE CALL NOTE  Ricardo [name] has been deemed a candidate for a follow-up tele-health visit to limit community exposure during the Covid-19 pandemic. I spoke with the patient via phone to ensure availability of phone/video source, confirm preferred email & phone number, and discuss instructions and expectations.  I reminded Ricardo [name] to be prepared with any vital sign and/or heart rhythm information that could potentially be obtained via home monitoring, at the time of his visit. I reminded Ricardo [name] to expect a phone call at the time of his visit if his visit.  Did the patient verbally acknowledge consent to treatment? YES  Patient will have VIDEO Visit on 4/3  Ricardo [name], [place] 02/03/2019 11:23 AM   DOWNLOADING [place], go to [employer] and type in WebEx in the search bar. [place] [employer], the blue/green circle. The app is free but as with any other app downloads, their phone may require them to verify saved payment information or Apple password. The patient does NOT have to create an account.  - If Android, ask patient to go to [employer] and type in WebEx in the search bar. [place] [employer], the blue/green circle. The app is free but as with any other app downloads, their phone may require them to verify saved payment information or Android password. The patient does NOT have to create an account.   CONSENT FOR TELE-HEALTH VISIT - PLEASE REVIEW  I hereby voluntarily request, consent and authorize [facility] and its employed or contracted physicians, physician assistants, nurse practitioners or other licensed health care professionals (the Practitioner), to provide me with telemedicine health care services (the "Services") as deemed necessary by the treating Practitioner. I acknowledge and consent to receive the Services by the Practitioner via  telemedicine. I understand that the telemedicine visit will involve communicating with the Practitioner through live audiovisual communication technology and the disclosure of certain medical information by electronic transmission. I acknowledge that I have been given the opportunity to request an in-person assessment or other available alternative prior to the telemedicine visit and am voluntarily participating in the telemedicine visit.  I understand that I have the right to withhold or withdraw my consent to the use of telemedicine in the course of my care at any time, without affecting my right to future care or treatment, and that the Practitioner or I may terminate the telemedicine visit at any time. I understand that I have the right to inspect all information obtained and/or recorded in the course of the telemedicine visit and may receive copies of available information for a reasonable fee.  I understand that some of the potential risks of receiving the Services via telemedicine include:  [place] Delay or interruption in medical evaluation due to technological equipment failure or disruption; . Information transmitted may not be sufficient (e.g. poor resolution of images) to allow for appropriate medical decision making by the Practitioner; and/or  . In rare instances, security protocols could fail, causing a breach of personal health information.  Furthermore, I acknowledge that it is my responsibility to provide information about my medical history, conditions and care that is complete and accurate to the best of my ability. I acknowledge that Practitioner's advice, recommendations, and/or decision may be based on factors not within their control, such as incomplete or inaccurate data provided by me or distortions of diagnostic images or specimens that may  result from electronic transmissions. I understand that the practice of medicine is not an exact science and that Practitioner makes no warranties or  guarantees regarding treatment outcomes. I acknowledge that I will receive a copy of this consent concurrently upon execution via email to the email address I last provided but may also request a printed copy by calling the office of [place].    I understand that my insurance will be billed for this visit.   I have read or had this consent read to me. . I understand the contents of this consent, which adequately explains the benefits and risks of the Services being provided via telemedicine.  . I have been provided ample opportunity to ask questions regarding this consent and the Services and have had my questions answered to my satisfaction. . I give my informed consent for the services to be provided through the use of telemedicine in my medical care  By participating in this telemedicine visit I agree to the above.

## 2019-02-04 ENCOUNTER — Other Ambulatory Visit: Payer: Self-pay

## 2019-02-04 ENCOUNTER — Telehealth (INDEPENDENT_AMBULATORY_CARE_PROVIDER_SITE_OTHER): Payer: 59 | Admitting: Interventional Cardiology

## 2019-02-04 ENCOUNTER — Encounter: Payer: Self-pay | Admitting: Interventional Cardiology

## 2019-02-04 VITALS — BP 113/82 | HR 80 | Ht 68.0 in | Wt 150.0 lb

## 2019-02-04 DIAGNOSIS — E782 Mixed hyperlipidemia: Secondary | ICD-10-CM

## 2019-02-04 DIAGNOSIS — I2510 Atherosclerotic heart disease of native coronary artery without angina pectoris: Secondary | ICD-10-CM | POA: Diagnosis not present

## 2019-02-04 DIAGNOSIS — L4050 Arthropathic psoriasis, unspecified: Secondary | ICD-10-CM

## 2019-02-04 DIAGNOSIS — G4730 Sleep apnea, unspecified: Secondary | ICD-10-CM | POA: Diagnosis not present

## 2019-02-04 MED ORDER — CLOPIDOGREL BISULFATE 75 MG PO TABS: ORAL_TABLET | ORAL | 3 refills | Status: DC

## 2019-02-04 MED FILL — CLOPIDOGREL 75 MG TABLET: 75 | 90 days supply | Qty: 94 | Fill #0

## 2019-02-04 NOTE — Progress Notes (Signed)
Virtual Visit via Video Note    Evaluation Performed:  Follow-up visit  This visit type was conducted due to national recommendations for restrictions regarding the COVID-19 Pandemic (e.g. social distancing).  This format is felt to be most appropriate for this patient at this time.  All issues noted in this document were discussed and addressed.  No physical exam was performed (except for noted visual exam findings with Video Visits).  Please refer to the patient's chart (MyChart message for video visits and phone note for telephone visits) for the patient's consent to telehealth for [facility].  Date:  02/04/2019   ID:  Ricardo [name], DOB [birth year], MRN [id number]  Patient Location:  home  Provider location:   [address]  PCP:  Patient, No Pcp Per  Cardiologist:  No primary care provider on file. [place] Electrophysiologist:  None   Chief Complaint:  CAD  History of Present Illness:    Ricardo [name] is a 63 y.o. male who presents via audio/video conferencing for a telehealth visit today.    Ricardo [name] is a 63 y.o. male who is being seen today for the evaluation of CAD at the request of No ref. provider found.  He had exertional angina in early 2019, it was a fullness in his chest that was worse with walking up hills.  Father had CABG at 25.  He had He had an abnormal stress which led to cath.    CABG was considered.  CTO of LAD and severe disease of OM.  Both were stented via the left radial in Feb 2019.  He asked for the left radial to be used.   3.0 x 38 [place] to LAD postdilated to 3.25 mm. 3.5 x 18 to OM postdilated to 5.0 mm  He has done well since then. He continues to exercise regularly.    Denies : Chest pain. Dizziness. Leg edema. Nitroglycerin use. Orthopnea. Palpitations. Paroxysmal nocturnal dyspnea. Shortness of breath. Syncope.   He is trying to establish with a cardiologist.    Wife reports that he has some change in breathing patterns at  night while asleep.  Wife is an MD.    The patient does not have symptoms concerning for COVID-19 infection (fever, chills, cough, or new shortness of breath).    Prior CV studies:   The following studies were reviewed today:  Cath results reviewed from [facility]  Past Medical History:  Diagnosis Date  . Coronary heart disease    2 stent placed March 2019  . DDD (degenerative disc disease), cervical 11/19/2018  . H/O heart artery stent   . History of coronary artery disease 11/19/2018   Status post stent placement March 2019  . History of seizures    No seizures in over 20 years.   . Psoriasis 11/19/2018   clobetasol  . Psoriatic arthritis ([place])    On Cosentyx since 2017   Past Surgical History:  Procedure Laterality Date  . COLONOSCOPY  2007 & 2016   Normal  . CYSTOSCOPY Right 2009   Removal of Ureteric Stone  . HEMORRHOID SURGERY  2004   External hemorrhoid removed   . HERNIA REPAIR    . INGUINAL HERNIA REPAIR  1976  . PILONIDAL CYST / SINUS EXCISION  2001  . THORACOTOMY  1997   and biopsy  . UPPER GI ENDOSCOPY  2007 & 2016   Normal     Current Meds  Medication Sig  . aspirin EC 81 MG tablet Take 81 mg  by mouth daily.  Marland Kitchen atorvastatin (LIPITOR) 80 MG tablet   . Coenzyme Q10 200 MG capsule Take by mouth.  . Cyanocobalamin (VITAMIN B12 PO) Take by mouth. Monday, Wednesday and Friday  . metoprolol succinate (TOPROL-XL) 25 MG 24 hr tablet Take by mouth.  . pantoprazole (PROTONIX) 40 MG tablet Take by mouth.  . Secukinumab 150 MG/ML SOAJ Inject 300 mg into the skin every 28 (twenty-eight) days. Inject 300 mg into the skin every 4 weeks  . Secukinumab, 300 MG Dose, (COSENTYX SENSOREADY, 300 MG,) 150 MG/ML SOAJ Inject into the muscle.  . Secukinumab, 300 MG Dose, (COSENTYX SENSOREADY, 300 MG,) 150 MG/ML SOAJ Inject 300 mg into the skin every 28 (twenty-eight) days.  . ticagrelor (BRILINTA) 90 MG TABS tablet Take by mouth 2 (two) times daily.     Allergies:   Patient has no  allergy information on record.   Social History   Tobacco Use  . Smoking status: Never Smoker  . Smokeless tobacco: Never Used  Substance Use Topics  . Alcohol use: Yes    Comment: occ  . Drug use: Never     Family Hx: The patient's family history includes Heart disease in his father.  ROS:   Please see the history of present illness.    No bleeding; but bruises easily.  All other systems reviewed and are negative.   Labs/Other Tests and Data Reviewed:    Recent Labs: 11/19/2018: ALT 22; BUN 16; Creat 0.83; Hemoglobin 13.2; Platelets 233; Potassium 4.1; Sodium 140   Recent Lipid Panel No results found for: CHOL, TRIG, HDL, CHOLHDL, LDLCALC, LDLDIRECT  Wt Readings from Last 3 Encounters:  02/04/19 150 lb (68 kg)  11/19/18 154 lb (69.9 kg)     Objective:    Vital Signs:  BP 113/82   Pulse 80   Ht 5\' 8"  (1.727 m)   Wt 150 lb (68 kg)   BMI 22.81 kg/m    Well nourished, well developed male in no acute distress. No shortness of breath  ASSESSMENT & PLAN:    1.  CAD: No angina. Continue aggressive secondary prevention.  Will stop aspirin and Brilinta.  Start Plavix 75 mg daily.    Hyperlipidemia:  Needs to have lipid recheck.  He became a vegetarian.  CONtinue atorvastatin.   Psoriatic arthritis: On Cosentyx.  Taking precautions to avoid COVID 19.  Plan for sleep study given wife observing breathing issues at night.   COVID-19 Education: The signs and symptoms of COVID-19 were discussed with the patient and how to seek care for testing (follow up with PCP or arrange E-visit).  The importance of social distancing was discussed today.  Patient Risk:   After full review of this patient's clinical status, I feel that they are at least moderate risk at this time.  Time:   Today, I have spent 45 minutes with the patient with telehealth technology discussing CAD.     Medication Adjustments/Labs and Tests Ordered: Current medicines are reviewed at length with the  patient today.  Concerns regarding medicines are outlined above.  Tests Ordered: No orders of the defined types were placed in this encounter.  Medication Changes: No orders of the defined types were placed in this encounter.   Disposition:  Follow up in 6 month(s)  Signed, Larae Grooms, MD  02/04/2019 8:20 AM    [facility]

## 2019-02-04 NOTE — Patient Instructions (Addendum)
Medication Instructions:  Your physician has recommended you make the following change in your medication:   1. STOP: aspirin  2. STOP: ticagrelor (Brilinta)  3. START: clopidogrel (plavix)  On DAY 1: Take 4 tablets (300 mg total) by mouth   On DAY 2 and everyday thereafter: Take 1 tablet (75 mg total) by mouth once a day   Lab work: Your physician recommends that you return for a FASTING lipid profile and complete metabolic panel in August   If you have labs (blood work) drawn today and your tests are completely normal, you will receive your results only by: Marland Kitchen MyChart Message (if you have MyChart) OR . A paper copy in the mail If you have any lab test that is abnormal or we need to change your treatment, we will call you to review the results.  Testing/Procedures: Your physician has recommended that you have a sleep study. This test records several body functions during sleep, including: brain activity, eye movement, oxygen and carbon dioxide blood levels, heart rate and rhythm, breathing rate and rhythm, the flow of air through your mouth and nose, snoring, body muscle movements, and chest and belly movement.  Follow-Up: At [facility], you and your health needs are our priority.  As part of our continuing mission to provide you with exceptional heart care, we have created designated Provider Care Teams.  These Care Teams include your primary Cardiologist (physician) and Advanced Practice Providers (APPs -  Physician Assistants and Nurse Practitioners) who all work together to provide you with the care you need, when you need it. . You will need a follow up appointment in 6 months.  Please call our office 2 months in advance to schedule this appointment.  You may see Casandra Doffing, MD or one of the following Advanced Practice Providers on your designated Care Team:   . Lyda Jester, PA-C . Dayna Dunn, PA-C . Ermalinda Barrios, PA-C  Any Other Special Instructions Will Be Listed  Below (If Applicable).

## 2019-02-04 NOTE — Telephone Encounter (Signed)
Patient with [name].  Due to immunosuppression use of N 95 mask was discussed.  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  At this point he wants to continue Cosentyx.  We discussed spacing the dose of Cosentyx to decrease immunosuppression.  He should take all sanitary precautions.

## 2019-02-16 ENCOUNTER — Telehealth: Payer: Self-pay

## 2019-02-16 NOTE — Telephone Encounter (Signed)
Dr. Estanislado Pandy spoke with patient and advised him to stop cosentyx and have minimal patient contact. The [facility] provider attestation form will be completed for patient.

## 2019-03-16 ENCOUNTER — Telehealth: Payer: Self-pay | Admitting: Interventional Cardiology

## 2019-03-16 MED ORDER — METOPROLOL SUCCINATE ER 25 MG PO TB24: 25.0000 mg | ORAL_TABLET | Freq: Every day | ORAL | 3 refills | Status: DC

## 2019-03-16 MED ORDER — ATORVASTATIN CALCIUM 80 MG PO TABS: 80.0000 mg | ORAL_TABLET | Freq: Every day | ORAL | 3 refills | Status: DC

## 2019-03-16 MED FILL — ATORVASTATIN 80 MG TABLET: 80 | 90 days supply | Qty: 90 | Fill #0

## 2019-03-16 MED FILL — METOPROLOL SUCCINATE ER 25: 25 | 90 days supply | Qty: 90 | Fill #0

## 2019-03-16 MED FILL — COSENTYX 300 MG DOSE-2 PENS: 150 | 28 days supply | Qty: 2 | Fill #2

## 2019-03-16 NOTE — Telephone Encounter (Signed)
Called and spoke to patient. Refills sent for atorvastatin and lipitor to preferred pharmacy. Fasting lab appointment made for August.

## 2019-03-16 NOTE — Telephone Encounter (Signed)
New Message   Pt c/o medication issue:  1. Name of Medication: atorvastin 80mg , metoprolol 25mg   2. How are you currently taking this medication (dosage and times per day)?   3. Are you having a reaction (difficulty breathing--STAT)?   4. What is your medication issue? Patient states that he did not mention to Dr.Varanasi that he will need this medication filled. He is wondering will Dr.Varanas be the one filling this now. If so he would need for these to be sent to the pharmacy Please call to discuss

## 2019-04-18 ENCOUNTER — Other Ambulatory Visit: Payer: Self-pay

## 2019-04-18 DIAGNOSIS — Z79899 Other long term (current) drug therapy: Secondary | ICD-10-CM

## 2019-04-18 MED FILL — COSENTYX 300 MG DOSE-2 PENS: 150 | 28 days supply | Qty: 2 | Fill #5

## 2019-04-18 NOTE — Telephone Encounter (Signed)
He is a patent I saw, but this is not a cardiac medicine.  I can do it as a one time thing if no one else will fill it, but he needs to have this filled by a PMD or rheumatologist going forward.

## 2019-04-18 NOTE — Telephone Encounter (Signed)
Patient is requesting refills and is not a patient of ours. Patients previous PCP is no longer a provider here. Patient has not been seen here in years. Please address the refill

## 2019-04-19 MED ORDER — SECUKINUMAB 150 MG/ML ~~LOC~~ SOAJ: 300.0000 mg | SUBCUTANEOUS | 0 refills | Status: DC

## 2019-04-19 NOTE — Telephone Encounter (Signed)
Called and spoke with patient and he would like med filled by Dr. Estanislado Pandy. Will route refill request.

## 2019-04-19 NOTE — Telephone Encounter (Signed)
Okay to give 30-day supply of Cosentyx.  He needs labs this month.  The labs are due every 3 months.

## 2019-04-19 NOTE — Telephone Encounter (Signed)
We will refill Cosentyx at this time.  It is important that he should get labs every 3 months for monitoring the drug toxicity.  I will also emphasizes at the follow-up visit.

## 2019-04-19 NOTE — Telephone Encounter (Signed)
Last Visit: 11/19/2018 Next Visit: 05/25/2019 Labs: 11/19/2018 CMP WNL. HIV non-reactive. Absolute eosinophils are mildly elevated. Rest of lab work is WNL TB Gold: 05/01/2018 neg   Spoke with patient advised patient he is due to update labs this month, patient states he will get labs drawn at the 05/25/2019 appointment. Patient requested a 2 month supply instead of 30 days.

## 2019-04-20 ENCOUNTER — Other Ambulatory Visit: Payer: Self-pay | Admitting: Pharmacist

## 2019-04-20 MED ORDER — SECUKINUMAB 150 MG/ML ~~LOC~~ SOAJ: 300.0000 mg | SUBCUTANEOUS | 0 refills | Status: DC

## 2019-04-25 MED FILL — CLOPIDOGREL 75 MG TABLET: 75 | 90 days supply | Qty: 90 | Fill #1

## 2019-05-13 ENCOUNTER — Other Ambulatory Visit: Payer: Self-pay | Admitting: Rheumatology

## 2019-05-13 MED ORDER — SECUKINUMAB 150 MG/ML ~~LOC~~ SOAJ: 300.0000 mg | SUBCUTANEOUS | 0 refills | Status: DC

## 2019-05-13 MED FILL — COSENTYX 300 MG DOSE-2 PENS: 150 | 28 days supply | Qty: 2 | Fill #0

## 2019-05-13 NOTE — Telephone Encounter (Signed)
I will strongly advise for himto get labs ASAP. He can have them drawn at his office to monitor toxicity. He needs CBC, CMP, TB gold. Ok to give the refill for 30 d

## 2019-05-13 NOTE — Telephone Encounter (Signed)
Last Visit: 11/19/2018 Next Visit: 05/25/2019 Labs: 11/19/2018 CMP WNL. HIV non-reactive. Absolute eosinophils are mildly elevated. Rest of lab work is WNL TB Gold: 05/01/2018 neg   Patient called stating due to moving his appointment to 8/19 he will be out of Cosentyx at the end of July.  Patient is requesting an additional refill for August.  Patient states due to his work schedule he has labwork and appointment on the same date.  Please advise.  Okay to refill Cosentyx?

## 2019-05-13 NOTE — Telephone Encounter (Signed)
Patient called stating due to moving his appointment to 8/19 he will be out of Cosentyx at the end of July.  Patient is requesting an additional refill for August.  Patient states due to his work schedule he has labwork and appointment on the same date.  Please advise.

## 2019-05-13 NOTE — Telephone Encounter (Signed)
Attempted to contact patient and left message on machine to strongly advise patient to have labs drawn at his office. advised patient of which labs are needed. Advised patient that a 30 day supply would be refilled.

## 2019-05-16 ENCOUNTER — Other Ambulatory Visit: Payer: Self-pay | Admitting: Pharmacist

## 2019-05-16 MED ORDER — SECUKINUMAB 150 MG/ML ~~LOC~~ SOAJ: 300.0000 mg | SUBCUTANEOUS | 0 refills | Status: DC

## 2019-05-25 ENCOUNTER — Other Ambulatory Visit: Payer: Self-pay | Admitting: *Deleted

## 2019-05-25 ENCOUNTER — Ambulatory Visit: Payer: Self-pay | Admitting: Rheumatology

## 2019-05-25 ENCOUNTER — Telehealth: Payer: Self-pay | Admitting: Rheumatology

## 2019-05-25 DIAGNOSIS — Z79899 Other long term (current) drug therapy: Secondary | ICD-10-CM

## 2019-05-25 NOTE — Telephone Encounter (Signed)
Faxed lab orders. Called patient and advised CBC, CMP and TB Gold is what needs to be drawn.

## 2019-05-25 NOTE — Telephone Encounter (Signed)
Patient having bloodwork done at [name] on 06/10/2019. Patient would like lab orders to be sent there for him to have drawn from [place] as well. Ph# [phone] Fax# [phone] Attn. [place]. Patient request a call to verify you got this message, that you sent orders, and what labs are to be drawn.

## 2019-06-06 MED FILL — METOPROLOL SUCCINATE ER 25: 25 | 90 days supply | Qty: 90 | Fill #1

## 2019-06-06 MED FILL — ATORVASTATIN 80 MG TABLET: 80 | 90 days supply | Qty: 90 | Fill #1

## 2019-06-08 NOTE — Progress Notes (Signed)
Office Visit Note  Patient: Ricardo [name]             Date of Birth: 09-11-1956           MRN: [id number]             PCP: Shon Baton, MD Referring: No ref. provider found Visit Date: 06/22/2019 Occupation: '@GUAROCC' @  Subjective:  Right wrist pain     History of Present Illness: Ricardo [name] is a 63 y.o. male with history of psoriatic arthritis and psoriasis.  He states he has been having some pain and stiffness in his right wrist joint especially in the morning.  He describes the pain over his wrist and also the right CMC joint.  He also has intermittent pain in his left ankle after running.  He denies any joint swelling.  He states his psoriasis is very well controlled.  Activities of Daily Living:  Patient reports morning stiffness for  several minutes.   Patient Denies nocturnal pain.  Difficulty dressing/grooming: Denies Difficulty climbing stairs: Denies Difficulty getting out of chair: Denies Difficulty using hands for taps, buttons, cutlery, and/or writing: Denies  Review of Systems  Constitutional: Negative for fatigue.  HENT: Negative for mouth sores, mouth dryness and nose dryness.   Eyes: Negative for itching and dryness.  Respiratory: Negative for shortness of breath, wheezing and difficulty breathing.   Cardiovascular: Negative for chest pain, palpitations and swelling in legs/feet.  Gastrointestinal: Negative for abdominal pain, blood in stool, constipation and diarrhea.  Endocrine: Negative for increased urination.  Genitourinary: Negative for painful urination.  Musculoskeletal: Positive for arthralgias, joint pain and morning stiffness. Negative for joint swelling.  Skin: Negative for rash and redness.  Allergic/Immunologic: Negative for susceptible to infections.  Neurological: Negative for dizziness, light-headedness, headaches, memory loss and weakness.  Hematological: Positive for bruising/bleeding tendency.  Psychiatric/Behavioral: Negative for  confusion and sleep disturbance. The patient is not nervous/anxious.     PMFS History:  Patient Active Problem List   Diagnosis Date Noted  . History of coronary artery disease 11/19/2018  . Current use of anticoagulant therapy 11/19/2018  . Psoriasis 11/19/2018  . Psoriatic arthritis ([place]) 11/19/2018  . High risk medication use 11/19/2018  . DDD (degenerative disc disease), cervical 11/19/2018  . Encounter for medication review 05/13/2018    Past Medical History:  Diagnosis Date  . Coronary heart disease    2 stent placed March 2019  . DDD (degenerative disc disease), cervical 11/19/2018  . H/O heart artery stent   . History of coronary artery disease 11/19/2018   Status post stent placement March 2019  . History of seizures    No seizures in over 20 years.   . Psoriasis 11/19/2018   clobetasol  . Psoriatic arthritis ([place])    On Cosentyx since 2017    Family History  Problem Relation Age of Onset  . Heart disease Father    Past Surgical History:  Procedure Laterality Date  . COLONOSCOPY  2007 & 2016   Normal  . CYSTOSCOPY Right 2009   Removal of Ureteric Stone  . HEMORRHOID SURGERY  2004   External hemorrhoid removed   . HERNIA REPAIR    . INGUINAL HERNIA REPAIR  1976  . PILONIDAL CYST / SINUS EXCISION  2001  . THORACOTOMY  1997   and biopsy  . UPPER GI ENDOSCOPY  2007 & 2016   Normal   Social History   Social History Narrative   ** Merged History Encounter **  There is no immunization history on file for this patient.   Objective: Vital Signs: BP 105/69 (BP Location: Left Arm, Patient Position: Sitting, Cuff Size: Normal)   Pulse 92   Resp 13   Ht '5\' 9"'  (1.753 m)   Wt 154 lb 12.8 oz (70.2 kg)   BMI 22.86 kg/m    Physical Exam Vitals signs and nursing note reviewed.  Constitutional:      Appearance: He is well-developed.  HENT:     Head: Normocephalic and atraumatic.  Eyes:     Conjunctiva/sclera: Conjunctivae normal.     Pupils: Pupils  are equal, round, and reactive to light.  Neck:     Musculoskeletal: Normal range of motion and neck supple.  Cardiovascular:     Rate and Rhythm: Normal rate and regular rhythm.     Heart sounds: Normal heart sounds.  Pulmonary:     Effort: Pulmonary effort is normal.     Breath sounds: Normal breath sounds.  Abdominal:     General: Bowel sounds are normal.     Palpations: Abdomen is soft.  Skin:    General: Skin is warm and dry.     Capillary Refill: Capillary refill takes less than 2 seconds.  Neurological:     Mental Status: He is alert and oriented to person, place, and time.  Psychiatric:        Behavior: Behavior normal.      Musculoskeletal Exam: C-spine thoracic and lumbar spine with good range of motion.  He had no SI joint tenderness.  Shoulder joints elbow joints wrist joints with good range of motion.  He has some synovial thickening over right wrist joint.  He also had right CMC discomfort.  There was no PIP or DIP synovitis.  Hip joints knee joints ankles were in good range of motion.  He has mild tenderness on palpation over left medial malleolus.  CDAI Exam: CDAI Score: - Patient Global: -; Provider Global: - Swollen: -; Tender: - Joint Exam   No joint exam has been documented for this visit   There is currently no information documented on the homunculus. Go to the Rheumatology activity and complete the homunculus joint exam.  Investigation: No additional findings.  Imaging: No results found.  Recent Labs: Lab Results  Component Value Date   WBC 8.1 06/10/2019   HGB 13.3 06/10/2019   PLT 237 06/10/2019   NA 140 06/10/2019   K 4.5 06/10/2019   CL 103 06/10/2019   CO2 24 06/10/2019   GLUCOSE 96 06/10/2019   BUN 12 06/10/2019   CREATININE 0.92 06/10/2019   BILITOT 0.5 06/10/2019   ALKPHOS 67 06/10/2019   AST 25 06/10/2019   ALT 21 06/10/2019   PROT 7.0 06/10/2019   ALBUMIN 4.4 06/10/2019   CALCIUM 9.5 06/10/2019   GFRAA 103 06/10/2019    QFTBGOLDPLUS Negative 06/13/2019    Speciality Comments: No specialty comments available.  Procedures:  No procedures performed Allergies: Patient has no allergy information on record.   Assessment / Plan:     Visit Diagnoses: Psoriatic arthritis ([place]) - diagnosed in 1999, treated by Dr. Kerry Hough in [place] -he had inadequate response to methotrexate, Enbrel, Humira. -He is doing quite well on Cosentyx.  He has some right wrist joint synovial thickening and intermittent discomfort.  Overall his symptoms are quite well controlled.  Psoriasis -he has no active psoriasis patches.  He states he has occasional patch for which he uses clobetasol cream.  He wanted a refill  today.  Prescription refill was given.  Plan: clobetasol cream (TEMOVATE) 0.05 %,   High risk medication use - Cosentyx 300 mg every 28 days.   Last TB gold negative on 06/13/2019 and will monitor yearly.  Most recent CBC/CMP within normal limits on 06/10/2019.  Due for CBC/CMP in November and will monitor every 3 months. Standing orders are in place. Recommend annual influenza, Pneumovax 23, Prevnar 13, and Shingrix as indicated for immunosuppressant therapy (started 10/2016)Inadquate response to Enbrel and Humira   Arthritis of carpometacarpal (CMC) joint of right thumb -joint protection was discussed.  I have given him prescription for right CMC brace.  History of coronary artery disease - Status post stent placement March 2019   Current use of anticoagulant therapy   Orders: Orders Placed This Encounter  Procedures  . CBC with Differential/Platelet  . CMP14+EGFR  . Lipid panel   Meds ordered this encounter  Medications  . DISCONTD: Secukinumab 150 MG/ML SOAJ    Sig: Inject 300 mg into the skin every 28 (twenty-eight) days.    Dispense:  6 pen    Refill:  0  . clobetasol cream (TEMOVATE) 0.05 %    Sig: Apply 1 application topically 2 (two) times daily.    Dispense:  45 g    Refill:  1     Follow-Up Instructions:  Return in about 6 months (around 12/23/2019) for Psoriatic arthritis.   Bo Merino, MD  Note - This record has been created using Editor, commissioning.  Chart creation errors have been sought, but may not always  have been located. Such creation errors do not reflect on  the standard of medical care.

## 2019-06-10 ENCOUNTER — Other Ambulatory Visit: Payer: Self-pay | Admitting: *Deleted

## 2019-06-10 ENCOUNTER — Other Ambulatory Visit: Payer: 59 | Admitting: *Deleted

## 2019-06-10 ENCOUNTER — Other Ambulatory Visit: Payer: Self-pay

## 2019-06-10 DIAGNOSIS — I2510 Atherosclerotic heart disease of native coronary artery without angina pectoris: Secondary | ICD-10-CM

## 2019-06-10 DIAGNOSIS — E782 Mixed hyperlipidemia: Secondary | ICD-10-CM | POA: Diagnosis not present

## 2019-06-10 LAB — CBC
Hematocrit: 39.8 % (ref 37.5–51.0)
Hemoglobin: 13.3 g/dL (ref 13.0–17.7)
MCH: 31 pg (ref 26.6–33.0)
MCHC: 33.4 g/dL (ref 31.5–35.7)
MCV: 93 fL (ref 79–97)
Platelets: 237 10*3/uL (ref 150–450)
RBC: 4.29 x10E6/uL (ref 4.14–5.80)
RDW: 12 % (ref 11.6–15.4)
WBC: 8.1 10*3/uL (ref 3.4–10.8)

## 2019-06-10 LAB — LIPID PANEL
Chol/HDL Ratio: 3 ratio (ref 0.0–5.0)
Cholesterol, Total: 104 mg/dL (ref 100–199)
HDL: 35 mg/dL — ABNORMAL LOW (ref >39)
LDL Calculated: 37 mg/dL (ref 0–99)
Triglycerides: 161 mg/dL — ABNORMAL HIGH (ref 0–149)
VLDL Cholesterol Cal: 32 mg/dL (ref 5–40)

## 2019-06-10 LAB — COMPREHENSIVE METABOLIC PANEL
ALT: 21 IU/L (ref 0–44)
AST: 25 IU/L (ref 0–40)
Albumin/Globulin Ratio: 1.7 (ref 1.2–2.2)
Albumin: 4.4 g/dL (ref 3.8–4.8)
Alkaline Phosphatase: 67 IU/L (ref 39–117)
BUN/Creatinine Ratio: 13 (ref 10–24)
BUN: 12 mg/dL (ref 8–27)
Bilirubin Total: 0.5 mg/dL (ref 0.0–1.2)
CO2: 24 mmol/L (ref 20–29)
Calcium: 9.5 mg/dL (ref 8.6–10.2)
Chloride: 103 mmol/L (ref 96–106)
Creatinine, Ser: 0.92 mg/dL (ref 0.76–1.27)
GFR calc Af Amer: 103 mL/min/{1.73_m2} (ref >59)
GFR calc non Af Amer: 89 mL/min/{1.73_m2} (ref >59)
Globulin, Total: 2.6 g/dL (ref 1.5–4.5)
Glucose: 96 mg/dL (ref 65–99)
Potassium: 4.5 mmol/L (ref 3.5–5.2)
Sodium: 140 mmol/L (ref 134–144)
Total Protein: 7 g/dL (ref 6.0–8.5)

## 2019-06-13 DIAGNOSIS — Z79899 Other long term (current) drug therapy: Secondary | ICD-10-CM | POA: Diagnosis not present

## 2019-06-16 LAB — QUANTIFERON-TB GOLD PLUS
QuantiFERON Mitogen Value: 6.17 IU/mL
QuantiFERON Nil Value: 0.01 IU/mL
QuantiFERON TB1 Ag Value: 0.02 IU/mL
QuantiFERON TB2 Ag Value: 0.02 IU/mL
QuantiFERON-TB Gold Plus: NEGATIVE

## 2019-06-22 ENCOUNTER — Ambulatory Visit (INDEPENDENT_AMBULATORY_CARE_PROVIDER_SITE_OTHER): Payer: 59 | Admitting: Rheumatology

## 2019-06-22 ENCOUNTER — Other Ambulatory Visit: Payer: Self-pay

## 2019-06-22 ENCOUNTER — Encounter: Payer: Self-pay | Admitting: Rheumatology

## 2019-06-22 ENCOUNTER — Other Ambulatory Visit: Payer: Self-pay | Admitting: Pharmacist

## 2019-06-22 VITALS — BP 105/69 | HR 92 | Resp 13 | Ht 69.0 in | Wt 154.8 lb

## 2019-06-22 DIAGNOSIS — M1811 Unilateral primary osteoarthritis of first carpometacarpal joint, right hand: Secondary | ICD-10-CM

## 2019-06-22 DIAGNOSIS — Z79899 Other long term (current) drug therapy: Secondary | ICD-10-CM

## 2019-06-22 DIAGNOSIS — L4050 Arthropathic psoriasis, unspecified: Secondary | ICD-10-CM | POA: Diagnosis not present

## 2019-06-22 DIAGNOSIS — Z7901 Long term (current) use of anticoagulants: Secondary | ICD-10-CM | POA: Diagnosis not present

## 2019-06-22 DIAGNOSIS — Z8679 Personal history of other diseases of the circulatory system: Secondary | ICD-10-CM

## 2019-06-22 DIAGNOSIS — L409 Psoriasis, unspecified: Secondary | ICD-10-CM | POA: Diagnosis not present

## 2019-06-22 MED ORDER — SECUKINUMAB 150 MG/ML ~~LOC~~ SOAJ: 300.0000 mg | SUBCUTANEOUS | 0 refills | Status: DC

## 2019-06-22 MED ORDER — CLOBETASOL PROPIONATE 0.05 % EX CREA: 1.0000 "application " | TOPICAL_CREAM | Freq: Two times a day (BID) | CUTANEOUS | 1 refills | Status: DC

## 2019-06-22 MED FILL — COSENTYX 300 MG DOSE-2 PENS: 150 | 28 days supply | Qty: 2 | Fill #1

## 2019-06-22 MED FILL — CLOBETASOL PROPIONATE 0.05: 0.05 | 20 days supply | Qty: 45 | Fill #0

## 2019-06-22 NOTE — Patient Instructions (Signed)
Standing Labs We placed an order today for your standing lab work.    Please come back and get your standing labs in November and every 3 months   We have open lab daily Monday through Thursday from 8:30-12:30 PM and 1:30-4:30 PM and Friday from 8:30-12:30 PM and 1:30 -4:00 PM at the office of Dr. Imojean Yoshino.   You may experience shorter wait times on Monday and Friday afternoons. The office is located at [address], [address], [place], [place] [address] No appointment is necessary.   Labs are drawn by [facility].  You may receive a bill from [facility] for your lab work.  If you wish to have your labs drawn at another location, please call the office 24 hours in advance to send orders.  If you have any questions regarding directions or hours of operation,  please call [phone].   Just as a reminder please drink plenty of water prior to coming for your lab work. Thanks 

## 2019-07-01 DIAGNOSIS — K30 Functional dyspepsia: Secondary | ICD-10-CM | POA: Diagnosis not present

## 2019-07-01 DIAGNOSIS — D8989 Other specified disorders involving the immune mechanism, not elsewhere classified: Secondary | ICD-10-CM | POA: Diagnosis not present

## 2019-07-01 DIAGNOSIS — J309 Allergic rhinitis, unspecified: Secondary | ICD-10-CM | POA: Diagnosis not present

## 2019-07-01 DIAGNOSIS — E785 Hyperlipidemia, unspecified: Secondary | ICD-10-CM | POA: Diagnosis not present

## 2019-07-01 DIAGNOSIS — I2510 Atherosclerotic heart disease of native coronary artery without angina pectoris: Secondary | ICD-10-CM | POA: Diagnosis not present

## 2019-07-01 DIAGNOSIS — M180 Bilateral primary osteoarthritis of first carpometacarpal joints: Secondary | ICD-10-CM | POA: Diagnosis not present

## 2019-07-01 DIAGNOSIS — L4050 Arthropathic psoriasis, unspecified: Secondary | ICD-10-CM | POA: Diagnosis not present

## 2019-07-01 DIAGNOSIS — Z1331 Encounter for screening for depression: Secondary | ICD-10-CM | POA: Diagnosis not present

## 2019-07-01 DIAGNOSIS — C4491 Basal cell carcinoma of skin, unspecified: Secondary | ICD-10-CM | POA: Diagnosis not present

## 2019-07-01 DIAGNOSIS — L409 Psoriasis, unspecified: Secondary | ICD-10-CM | POA: Diagnosis not present

## 2019-07-01 MED FILL — COSENTYX 300 MG DOSE-2 PENS: 150 | 28 days supply | Qty: 2 | Fill #0

## 2019-07-01 MED FILL — CLOBETASOL PROPIONATE 0.05: 0.05 | 20 days supply | Qty: 45 | Fill #0

## 2019-07-19 MED FILL — CLOPIDOGREL 75 MG TABLET: 75 | 90 days supply | Qty: 90 | Fill #2

## 2019-07-21 ENCOUNTER — Encounter: Payer: Self-pay | Admitting: Neurology

## 2019-07-21 ENCOUNTER — Ambulatory Visit (INDEPENDENT_AMBULATORY_CARE_PROVIDER_SITE_OTHER): Payer: 59 | Admitting: Neurology

## 2019-07-21 ENCOUNTER — Other Ambulatory Visit: Payer: Self-pay

## 2019-07-21 VITALS — BP 119/73 | HR 79 | Ht 69.0 in | Wt 159.0 lb

## 2019-07-21 DIAGNOSIS — R0683 Snoring: Secondary | ICD-10-CM | POA: Diagnosis not present

## 2019-07-21 DIAGNOSIS — Z8679 Personal history of other diseases of the circulatory system: Secondary | ICD-10-CM

## 2019-07-21 DIAGNOSIS — L4050 Arthropathic psoriasis, unspecified: Secondary | ICD-10-CM | POA: Diagnosis not present

## 2019-07-21 DIAGNOSIS — Z8669 Personal history of other diseases of the nervous system and sense organs: Secondary | ICD-10-CM | POA: Diagnosis not present

## 2019-07-21 NOTE — Progress Notes (Signed)
SLEEP MEDICINE CLINIC    Provider:  Larey Seat, MD  Primary Care Physician:  Ricardo Baton, MD [place] [address]     Referring Provider: Shon [name], [place],  [place] [id number]          Chief Complaint according to patient   Patient presents with:    . 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 I had a cardiac stent placed last year, followed by [name]".    I have the pleasure of seeing 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  with a possible sleep disorder.  She has a  has a past medical history of Coronary heart disease, DDD (degenerative disc disease), cervical (11/19/2018), H/O heart artery stent, History of coronary artery disease (11/19/2018), History of seizures, Psoriasis (11/19/2018), and Psoriatic arthritis ([place])..  He had a seizure disorder in his teens and early twenties, had an overnight study in [place], that was in 1970. He took Carbamazepine for 20 years. He is not sure how to differentiate a  seizure form a nightmare, it felt similar. He was observed to have rhythmic head movements. He felt as if being touched in his sleep. Never had any event in daytime. He experienced sleep paralysis. He has been followed by psoriatic arthritis.     Family medical /sleep history: one brother with OSA,.brother had CAD and Stent.     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 The couple has no children,.  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 Tobacco  use: in college - now a party cigar smoker, none since stent .  ETOH use: drink 1 /month , Caffeine intake in form of Coffee( 2-3 /d) Soda( none ) Tea ( none ) ,no  energy drinks. Regular exercise in form of walking , was a regular at the gym.        Sleep habits are as follows: The patient's dinner time is between 8.30 PM.  The patient goes to bed at 10.30- 11  PM and continues to sleep for many hours, wakes rarely for bathroom breaks, he mostly wakes spontaneously due to palpitations( until 2 month ago- not sure what changed) the first time at 3-4 AM.   The preferred sleep position is on his left, with the support of 2 pillows. Dreams are reportedly frequent/vivid.  6.30  AM is the usual rise time. The patient wakes up spontaneously but has an alarm set, too.   He reports  feeling refreshed and  restored in AM, with symptoms such as dry mouth, dry throat, but no morning headaches and residual fatigue.  Naps are taken infrequently,very rarely.    Review of Systems: Out of a complete 14 system review, the patient complains of only the following symptoms, and all other reviewed systems are negative.:   fragmented sleep, autoimmune disease, history of " seizures"    How likely  are you to doze in the following situations: 0 = not likely, 1 = slight chance, 2 = moderate chance, 3 = [name] chance   Sitting and Reading? Watching Television? Sitting inactive in a public place (theater or meeting)? As a passenger in a car for an hour without a break? Lying down in the afternoon when circumstances permit? Sitting and talking to someone? Sitting quietly after lunch without alcohol? In a car, while stopped for a few minutes in traffic?   Total =3/ 24 points.   FSS endorsed at 17/ 63 points.   Social History   Socioeconomic History  . Marital status: Married    Spouse name: Not on file  . Number of children: Not on file  . Years of education: Not on file  . Highest education level: Not on  file  Occupational History  . Not on file  Social Needs  . Financial resource strain: Not on file  . Food insecurity    Worry: Not on file    Inability: Not on file  . Transportation needs    Medical: Not on file    Non-medical: Not on file  Tobacco Use  . Smoking status: Former Smoker    Quit date: 1993    Years since quitting: 27.7  . Smokeless tobacco: Never Used  Substance and Sexual Activity  . Alcohol use: Yes    Comment: occ  . Drug use: Never  . Sexual activity: Not on file  Lifestyle  . Physical activity    Days per week: Not on file    Minutes per session: Not on file  . Stress: Not on file  Relationships  . Social Herbalist on phone: Not on file    Gets together: Not on file    Attends religious service: Not on file    Active member of club or organization: Not on file    Attends meetings of clubs or organizations: Not on file    Relationship status: Not on file  Other Topics Concern  . Not on file  Social History Narrative   ** Merged History Encounter **        Family History  Problem Relation Age of Onset  . Heart disease Father   Brother with OSA and CAD, stent   Past Medical History:  Diagnosis Date  . Coronary heart disease    2 stent placed March 2019  . DDD (degenerative disc disease), cervical 11/19/2018  . H/O heart artery stent   . History of coronary artery disease 11/19/2018   Status post stent placement March 2019  . History of seizures    No seizures in over 20 years.   . Psoriasis 11/19/2018   clobetasol  . Psoriatic arthritis ([place])    On Cosentyx since 2017    Past Surgical History:  Procedure Laterality Date  . COLONOSCOPY  2007 & 2016   Normal  . CYSTOSCOPY Right 2009   Removal of Ureteric Stone  . HEMORRHOID SURGERY  2004   External hemorrhoid removed   . HERNIA REPAIR    . INGUINAL HERNIA REPAIR  1976  . PILONIDAL CYST / SINUS EXCISION  2001  . THORACOTOMY  1997   and biopsy  . UPPER GI ENDOSCOPY  2007  & 2016   Normal     Current Outpatient Medications on File Prior to Visit  Medication Sig Dispense Refill  . atorvastatin (LIPITOR) 80 MG tablet Take 1 tablet (80 mg total) by mouth daily  at 6 PM. 90 tablet 3  . clobetasol cream (TEMOVATE) AB-123456789 % Apply 1 application topically 2 (two) times daily. 45 g 1  . clopidogrel (PLAVIX) 75 MG tablet Take 4 tablets (300 mg total) on Day 1, then take 1 tablet (75 mg total) by mouth once a day (Patient taking differently: Take 75 mg by mouth daily. ) 94 tablet 3  . Coenzyme Q10 200 MG capsule Take by mouth.    . COSENTYX SENSOREADY, 300 MG, 150 MG/ML SOAJ     . Cyanocobalamin (VITAMIN B12 PO) Take by mouth. Monday, Wednesday and Friday    . metoprolol succinate (TOPROL-XL) 25 MG 24 hr tablet Take 1 tablet (25 mg total) by mouth daily. 90 tablet 3   No current facility-administered medications on file prior to visit.    Physical exam:  Today's Vitals   07/21/19 0842  BP: 119/73  Pulse: 79  Weight: 159 lb (72.1 kg)  Height: 5\' 9"  (1.753 m)   Body mass index is 23.48 kg/m.   Wt Readings from Last 3 Encounters:  07/21/19 159 lb (72.1 kg)  06/22/19 154 lb 12.8 oz (70.2 kg)  02/04/19 150 lb (68 kg)     Ht Readings from Last 3 Encounters:  07/21/19 5\' 9"  (1.753 m)  06/22/19 5\' 9"  (1.753 m)  02/04/19 5\' 8"  (1.727 m)      General: The patient is awake, alert and appears not in acute distress. The patient is well groomed. Head: Normocephalic, atraumatic. Neck is supple. Mallampati  3,  neck circumference:14. 75  inches . Nasal airflow s slightly congested.  Retrognathia is  seen.  Dental status:  Cardiovascular:  Regular rate and cardiac rhythm by pulse,  without distended neck veins. Respiratory: Lungs are clear to auscultation.  Skin:  Without evidence of ankle edema, or rash. Trunk: The patient's posture is erect.   Neurologic exam : The patient is awake and alert, oriented to place and time.   Memory subjective described as intact.   Attention span & concentration ability appears normal.  Speech is fluent,  without  dysarthria, dysphonia or aphasia.  Mood and affect are appropriate.   Cranial nerves: no loss of smell or taste reported  Pupils are equal and briskly reactive to light. Funduscopic exam  deferred. .  Extraocular movements in vertical and horizontal planes were intact and without nystagmus. No Diplopia. Visual fields by finger perimetry are intact.Hearing was intact to soft voice and finger rubbing.   Facial sensation intact to fine touch. Facial motor strength is symmetric and tongue and uvula move midline. Neck ROM : rotation, tilt and flexion extension were normal for age and shoulder shrug was symmetrical.    Motor exam:  Symmetric bulk, tone and ROM.   Normal tone without cog wheeling, symmetric grip strength . He has right wrist pain with arthritis, but no grip strength loss, no changes in penmanship  Sensory:  Fine touch, pinprick and vibration were tested  and  normal.  Proprioception tested in the upper extremities was normal. Coordination: Rapid alternating movements in the fingers/hands were of normal speed.  The Finger-to-nose maneuver was intact without evidence of ataxia, dysmetria or tremor. Gait and station: Patient could rise unassisted from a seated position, walked without assistive device.  Stance is of normal width/ base and the patient turned with steps.  Toe and heel walk were deferred.  Deep tendon reflexes: in the  upper and lower extremities are symmetric and intact.  Babinski response was deferred .  After spending a total time of  35  minutes face to face and additional time for physical and neurologic examination, review of laboratory studies,  personal review of imaging studies, reports and results of other testing and review of referral information / records as far as provided in visit, I have established the following assessments:  1) I like to order an attended sleep  study with expanded EEG for this colleague with a history of temporal lobe seizures.  2) His CAD which let to angina, and finally cardiac stent placement , his new onset snoring, all warrant evaluation of possible OSA.   3)  Autoimmune disease ( psoriasis) can explain some fatigue.    My Plan is to proceed with above sleep study    I would like to thank Ricardo Baton, MD and  for allowing me to meet with and to take care of this pleasant patient.   In short, [name] will follow up either personally or through our NP within 2 month.   CC: I will share my notes with  Dr. Virgina [name].   Electronically signed by: Ricardo Seat, MD 07/21/2019 8:53 AM  [facility] Board certified by The [employer] of Sleep Medicine and Diplomate of the [employer] of Sleep Medicine. Board certified In Neurology through the [place], Fellow of the [employer] of Neurology. Medical Director of [facility].

## 2019-07-29 ENCOUNTER — Telehealth: Payer: Self-pay | Admitting: *Deleted

## 2019-07-29 NOTE — Telephone Encounter (Signed)
Patient called,  needs PA for Cosyntx

## 2019-08-01 NOTE — Telephone Encounter (Signed)
Submitted a Prior Authorization request to [facility] for COSENTYX via Cover My Meds. Will update once we receive a response.   9:10 AM Beatriz Chancellor, CPhT

## 2019-08-03 ENCOUNTER — Telehealth: Payer: Self-pay | Admitting: Interventional Cardiology

## 2019-08-03 NOTE — Telephone Encounter (Signed)
Called and made patient aware that we are only allowing visitors for patients with cognitive impairment. Patient aware that we will call his wife during the visit to answer any questions/concerns that she may have.

## 2019-08-03 NOTE — Telephone Encounter (Signed)
New Message  Patient is calling to get clearance to have wife accompany him to his appointment with [name] on 08/05/19 at 11:40 am. Please give patient a call back to confirm.

## 2019-08-04 MED FILL — COSENTYX 300 MG DOSE-2 PENS: 150 | 28 days supply | Qty: 2 | Fill #1

## 2019-08-04 NOTE — Progress Notes (Signed)
Cardiology Office Note   Date:  08/05/2019   ID:  Ricardo [name], Ricardo [name], MRN [id number]  PCP:  Ricardo Baton, MD    No chief complaint on file.  CAD  Wt Readings from Last 3 Encounters:  08/05/19 153 lb 1.9 oz (69.5 kg)  07/21/19 159 lb (72.1 kg)  06/22/19 154 lb 12.8 oz (70.2 kg)       History of Present Illness: Ricardo [name] is a 63 y.o. male  who is being seen today for the evaluation of CAD.  He had basal cell CA on the face several years ago.  He had exertional angina in early 2019, it was a fullness in his chest that was worse with walking up hills.  Father had CABG at 74.  He had He had an abnormal stress which led to cath.    CABG was considered.  CTO of LAD and severe disease of OM.  Both were stented via the left radial in Feb 2019.  He asked for the left radial to be used.   3.0 x 38 [place] to LAD postdilated to 3.25 mm. 3.5 x 18 to OM postdilated to 5.0 mm  He has done well since then. He continues to exercise regularly.  He was switched to Plavix only in 02/2019.  Sleep study was ordered.  He eats a vegetarian diet.   Since the last visit, he has done well.  SHOB got better off of Brilinta. He added aspirin back, but stopped it after some stomach upset.  Has had gastric erosions in the past.   Denies : Chest pain. Dizziness. Leg edema. Nitroglycerin use. Orthopnea. Palpitations. Paroxysmal nocturnal dyspnea. Shortness of breath. Syncope.       Past Medical History:  Diagnosis Date  . Coronary heart disease    2 stent placed March 2019  . DDD (degenerative disc disease), cervical 11/19/2018  . H/O heart artery stent   . History of coronary artery disease 11/19/2018   Status post stent placement March 2019  . History of seizures    No seizures in over 20 years.   . Psoriasis 11/19/2018   clobetasol  . Psoriatic arthritis (Ricardo [name])    On Cosentyx since 2017    Past Surgical History:  Procedure Laterality Date  . COLONOSCOPY  2007 &  2016   Normal  . CYSTOSCOPY Right 2009   Removal of Ureteric Stone  . HEMORRHOID SURGERY  2004   External hemorrhoid removed   . HERNIA REPAIR    . INGUINAL HERNIA REPAIR  1976  . PILONIDAL CYST / SINUS EXCISION  2001  . THORACOTOMY  1997   and biopsy  . UPPER GI ENDOSCOPY  2007 & 2016   Normal     Current Outpatient Medications  Medication Sig Dispense Refill  . atorvastatin (LIPITOR) 80 MG tablet Take 1 tablet (80 mg total) by mouth daily at 6 PM. 90 tablet 3  . clobetasol cream (TEMOVATE) [id number] % Apply 1 application topically 2 (two) times daily. 45 g 1  . clopidogrel (PLAVIX) 75 MG tablet Take 75 mg by mouth daily.    . Coenzyme Q10 200 MG capsule Take by mouth.    . COSENTYX SENSOREADY, 300 MG, 150 MG/ML SOAJ     . Cyanocobalamin (VITAMIN B12 PO) Take by mouth. Monday, Wednesday and Friday    . metoprolol succinate (TOPROL-XL) 25 MG 24 hr tablet Take 1 tablet (25 mg total) by mouth daily. 90 tablet 3  . pantoprazole (PROTONIX) 20  MG tablet Take 20 mg by mouth as needed for heartburn or indigestion.     No current facility-administered medications for this visit.     Allergies:   Patient has no allergy information on record.    Social History:  The patient  reports that he quit smoking about 27 years ago. He has never used smokeless tobacco. He reports current alcohol use. He reports that he does not use drugs.   Family History:  The patient's family history includes Heart disease in his father.    ROS:  Please see the history of present illness.   Otherwise, review of systems are positive for GERD sx intermittently.   All other systems are reviewed and negative.    PHYSICAL EXAM: VS:  BP 116/78   Pulse 68   Ht 5\' 9"  (1.753 m)   Wt 153 lb 1.9 oz (69.5 kg)   SpO2 95%   BMI 22.61 kg/m  , BMI Body mass index is 22.61 kg/m. GEN: Well nourished, well developed, in no acute distress  HEENT: normal  Neck: no JVD, carotid bruits, or masses Cardiac: RRR; no murmurs,  rubs, or gallops,no edema  Respiratory:  clear to auscultation bilaterally, normal work of breathing GI: soft, nontender, nondistended, + BS MS: no deformity or atrophy  Skin: warm and dry, no rash Neuro:  Strength and sensation are intact Psych: euthymic mood, full affect   EKG:   The ekg ordered today demonstrates NSR, nonspecific T wave changes   Recent Labs: 06/10/2019: ALT 21; BUN 12; Creatinine, Ser 0.92; Hemoglobin 13.3; Platelets 237; Potassium 4.5; Sodium 140   Lipid Panel    Component Value Date/Time   CHOL 104 06/10/2019 0810   TRIG 161 (H) 06/10/2019 0810   HDL 35 (L) 06/10/2019 0810   CHOLHDL 3.0 06/10/2019 0810   LDLCALC 37 06/10/2019 0810     Other studies Reviewed: Additional studies/ records that were reviewed today with results demonstrating: labs results.   ASSESSMENT AND PLAN:  1. CAD: No angina.  Continue aggressive secondary prevention.  Sticking with vegetarian diet.  We discussed DAPT.  He may prefer to use aspirin.  Refill SL NTG. 2. Hyperlipidemia: TG slightly increased.  OK to decrease atorvastatin to 40 mg daily.  Recheck lipids in January.  3. Psoriatic arthritis: Ankle issues.  He is following COVID distancing precautions. 4. Observed apnea: Sleep study tomorrow.  Seeing Dr. Brett Fairy.   Spoke with wife over the phone as well.    Current medicines are reviewed at length with the patient today.  The patient concerns regarding his medicines were addressed.  The following changes have been made:  No change  Labs/ tests ordered today include:  No orders of the defined types were placed in this encounter.   Recommend 150 minutes/week of aerobic exercise Low fat, low carb, high fiber diet recommended  Disposition:   FU in 1 year   Signed, Larae Grooms, MD  08/05/2019 12:00 PM    [address], [place], [place]  [address] Phone: [phone]; Fax: [phone]

## 2019-08-04 NOTE — Telephone Encounter (Signed)
Received notification from [facility] regarding a prior authorization for [place]. Authorization has been APPROVED from 07/29/2019 to 07/27/2020. Authorization is good for 12 fills.  Will send document to scan center.  Authorization # [id number]  Called patient and advised. Also notified pharmacy to refill patient's medication.  8:53 AM Beatriz Chancellor, CPhT

## 2019-08-05 ENCOUNTER — Encounter: Payer: Self-pay | Admitting: Interventional Cardiology

## 2019-08-05 ENCOUNTER — Other Ambulatory Visit: Payer: Self-pay

## 2019-08-05 ENCOUNTER — Ambulatory Visit (INDEPENDENT_AMBULATORY_CARE_PROVIDER_SITE_OTHER): Payer: 59 | Admitting: Interventional Cardiology

## 2019-08-05 VITALS — BP 116/78 | HR 68 | Ht 69.0 in | Wt 153.1 lb

## 2019-08-05 DIAGNOSIS — G4730 Sleep apnea, unspecified: Secondary | ICD-10-CM

## 2019-08-05 DIAGNOSIS — L4050 Arthropathic psoriasis, unspecified: Secondary | ICD-10-CM

## 2019-08-05 DIAGNOSIS — I2510 Atherosclerotic heart disease of native coronary artery without angina pectoris: Secondary | ICD-10-CM

## 2019-08-05 DIAGNOSIS — E782 Mixed hyperlipidemia: Secondary | ICD-10-CM | POA: Diagnosis not present

## 2019-08-05 MED ORDER — ATORVASTATIN CALCIUM 40 MG PO TABS: 40.0000 mg | ORAL_TABLET | Freq: Every day | ORAL | 3 refills | Status: DC

## 2019-08-05 MED ORDER — NITROGLYCERIN 0.4 MG SL SUBL: 0.4000 mg | SUBLINGUAL_TABLET | SUBLINGUAL | 3 refills | Status: DC | PRN

## 2019-08-05 MED FILL — ATORVASTATIN 40 MG TABLET: 40 | 90 days supply | Qty: 90 | Fill #0

## 2019-08-05 MED FILL — NITROGLYCERIN 0.4 MG TAB SL: 0.4 | 8 days supply | Qty: 25 | Fill #0

## 2019-08-05 NOTE — Patient Instructions (Addendum)
Medication Instructions:  Your physician has recommended you make the following change in your medication:   1. DECREASE: atorvastatin (lipitor) to 40 mg by mouth once a day  2. A refill for your Sublingual Nitroglycerin has been sent to your pharmacy   Lab work: Your physician recommends that you return for a FASTING lipid profile in December   If you have labs (blood work) drawn today and your tests are completely normal, you will receive your results only by: Marland Kitchen MyChart Message (if you have MyChart) OR . A paper copy in the mail If you have any lab test that is abnormal or we need to change your treatment, we will call you to review the results.  Testing/Procedures: None ordered  Follow-Up: At [facility], you and your health needs are our priority.  As part of our continuing mission to provide you with exceptional heart care, we have created designated Provider Care Teams.  These Care Teams include your primary Cardiologist (physician) and Advanced Practice Providers (APPs -  Physician Assistants and Nurse Practitioners) who all work together to provide you with the care you need, when you need it. . You will need a follow up appointment in 1 year.  Please call our office 2 months in advance to schedule this appointment.  You may see Casandra Doffing, MD or one of the following Advanced Practice Providers on your designated Care Team:   . Lyda Jester, PA-C . Dayna Dunn, PA-C . Ermalinda Barrios, PA-C  Any Other Special Instructions Will Be Listed Below (If Applicable).

## 2019-08-06 ENCOUNTER — Ambulatory Visit (INDEPENDENT_AMBULATORY_CARE_PROVIDER_SITE_OTHER): Payer: 59 | Admitting: Neurology

## 2019-08-06 DIAGNOSIS — Z8679 Personal history of other diseases of the circulatory system: Secondary | ICD-10-CM

## 2019-08-06 DIAGNOSIS — L4050 Arthropathic psoriasis, unspecified: Secondary | ICD-10-CM

## 2019-08-06 DIAGNOSIS — G4733 Obstructive sleep apnea (adult) (pediatric): Secondary | ICD-10-CM | POA: Diagnosis not present

## 2019-08-06 DIAGNOSIS — Z8669 Personal history of other diseases of the nervous system and sense organs: Secondary | ICD-10-CM

## 2019-08-06 DIAGNOSIS — R0683 Snoring: Secondary | ICD-10-CM

## 2019-08-16 NOTE — Addendum Note (Signed)
Addended by: Larey Seat on: 08/16/2019 02:41 PM   Modules accepted: Orders

## 2019-08-16 NOTE — Procedures (Signed)
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 Virgina [name],    Chief concern according to patient: " I am woken up in the middle of the night by palpitations, and my wife stated I snore more"  and " I also have a brother with OSA diagnosis. I had a cardiac stent placed last year, followed by Dr. Irish [name]".  I have the pleasure of seeing 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 with a medical history of Coronary heart disease, DDD (degenerative disc disease), cervical (11/19/2018), H/O heart artery stent, History of coronary artery disease (11/19/2018), History of seizures, Psoriasis (11/19/2018), and Psoriatic arthritis ([place]).  He had a seizure disorder in his teens and early twenties, had an overnight study in [place] in 1970. He took Carbamazepine for 20 years. He was observed to have rhythmic head movements. He felt as if being touched in his sleep. Never had any event in daytime. He experienced sleep paralysis.   The patient endorsed the Epworth Sleepiness Scale at 3 points.   The patient's weight 159 pounds with a height of 69 (inches), resulting in a BMI of 23.5 kg/m2. The patient's neck circumference measured 14.7 inches.  CURRENT MEDICATIONS: Lipitor, Plavix, CoQ10, Cosentyx ,Vitamin B12, Toprol-XL   PROCEDURE:  This is a multichannel digital polysomnogram utilizing the Somnostar 11.2 system.  Electrodes and sensors were applied and monitored per AASM Specifications.   EEG, EOG, Chin and Limb EMG, were sampled at 200 Hz.  ECG, Snore and Nasal Pressure, Thermal Airflow, Respiratory Effort, CPAP Flow and Pressure, Oximetry was sampled at 50 Hz. Digital video and audio were recorded.      BASELINE  STUDY: Lights Out was at 22:20 and Lights On at 05:08.  Total recording time (TRT) was 408.5 minutes, with a total sleep time (TST) of 235.5 minutes.   The patient's sleep latency was 41 minutes.  REM latency was 253.5 minutes.  The sleep efficiency was 57.6 %.     SLEEP ARCHITECTURE: WASO (Wake after sleep onset) was 173 minutes.  There were 14.5 minutes in Stage N1, 171 minutes Stage N2, 23.5 minutes Stage N3 and 26.5 minutes in Stage REM.  The percentage of Stage N1 was 6.2%, Stage N2 was 72.6%, Stage N3 was 10.1 % and Stage R (REM sleep) was 11.3%.   RESPIRATORY ANALYSIS:  There were a total of 126 respiratory events:  106 obstructive apneas, 0 central apneas and 1 mixed apnea with a total of 107 apneas and an apnea index (AI) of 27.3 /hour. There were 19 hypopneas with a hypopnea index of 4.8 /hour. The total APNEA/HYPOPNEA INDEX (AHI) was 32.1/hour.   23 events occurred in REM sleep and 39 events in NREM. The REM AHI was 52.1 /hour, versus a non-REM AHI of 29.6. The patient spent 220.5 minutes of total sleep time in the supine position and 15 minutes in right lateral/ non-supine position with surprisingly loud snoring. .  OXYGEN SATURATION & C02:  The Wake baseline 02 saturation was 95%, with the lowest being 78%. Time spent below 89% saturation equaled 20 minutes.  The arousals were noted as: 44 were spontaneous, 0 were  associated with PLMs, and 43 were associated with respiratory events. The Periodic Limb Movement (PLM) index was 0 and the PLM Arousal index was 0/hour. Audio and video analysis did not show any abnormal or unusual movements, behaviors, phonations or vocalizations.   The patient took two bathroom breaks. Snoring was noted. EEG without any epileptiform activity. EKG was in keeping with normal sinus rhythm (NSR).   IMPRESSION:  1. Obstructive Sleep Apnea(OSA) severe at AHI 32/h, REM accentuated to AHI of 52.1/h. No prolonged hypoxemia, but REM clusters of intermittent hypoxia  with heart rate increase. 2. No Periodic Limb Movement Disorder (PLMD) 3. Continuous Snoring in supine and right lateral position. 4. Normal sinus rhythm.    RECOMMENDATIONS:  1. Advise auto- CPAP titration to optimize therapy.  a dental device is unlikely to work for this degree of apnea and REM dependency.  Position did not play a major role either.  Auto CPAP 6-16 cm water with 1 cm EPR and mask of choice, heated humidity.   I certify that I have reviewed the entire raw data recording prior to the issuance of this report in accordance with the Standards of Accreditation of the American Academy of Sleep Medicine (AASM)     Larey Seat, MD    08-16-2019 Diplomat, American Board of Psychiatry and Neurology  Diplomat, American Board of [place] Director, [place] [facility] [employer]

## 2019-08-17 ENCOUNTER — Telehealth: Payer: Self-pay | Admitting: Neurology

## 2019-08-17 NOTE — Telephone Encounter (Signed)
-----   Message from Larey Seat, MD sent at 08/16/2019  2:41 PM EDT ----- IMPRESSION:  1. Obstructive Sleep Apnea(OSA) severe at AHI 32/h, REM accentuated to AHI of 52.1/h. No prolonged hypoxemia, but REM clusters of intermittent hypoxia with heart rate increase. 2. No Periodic Limb Movement Disorder (PLMD) 3. Continuous Snoring in supine and right lateral position. 4. Normal sinus rhythm.    RECOMMENDATIONS:  1. Advise auto- CPAP titration to optimize therapy.  a dental device is unlikely to work for this degree of apnea and REM dependency.  Position did not play a major role either.  Auto CPAP 6-16 cm water with 1 cm EPR and mask of choice, heated humidity.   I certify that I have reviewed the entire raw data recording prior to the issuance of this report in accordance with the Standards of Accreditation of the [employer] [place] [employer] ([employer])

## 2019-08-17 NOTE — Telephone Encounter (Signed)
I called pt. I advised pt that Dr. Brett Fairy reviewed their sleep study results and found that pt has moderate to severe sleep apnea. Dr. Brett Fairy  recommends that pt start auto CPAP. I reviewed PAP compliance expectations with the pt. Pt is agreeable to starting a CPAP. I advised pt that an order will be sent to a DME, [facility], and [facility] will call the pt within about one week after they file with the pt's insurance. [facility] will show the pt how to use the machine, fit for masks, and troubleshoot the CPAP if needed. A follow up appt was made for insurance purposes with Dr. Brett Fairy on Dec 16,2020 at 1:30 pm. Pt verbalized understanding to arrive 15 minutes early and bring their CPAP. A letter with all of this information in it will be mailed to the pt as a reminder. I verified with the pt that the address we have on file is correct. Pt verbalized understanding of results. Pt had no questions at this time but was encouraged to call back if questions arise. I have sent the order to [facility] and have received confirmation that they have received the order.

## 2019-08-18 ENCOUNTER — Telehealth (INDEPENDENT_AMBULATORY_CARE_PROVIDER_SITE_OTHER): Payer: 59 | Admitting: Pharmacist

## 2019-08-18 DIAGNOSIS — Z79899 Other long term (current) drug therapy: Secondary | ICD-10-CM

## 2019-08-18 NOTE — Progress Notes (Signed)
   S: Patient presents today to the [facility] for review of their specialty medication.   Patient is currently taking Cosentyx for psoriatic arthritis. Patient is managed by Dr. Estanislado Pandy  Adherence: denies any missed doses  Efficacy: working well for him, no adverse effects  Monitoring/Current adverse effects: S/sx of infection: denies GI upset: denies S/sx of hypersensitivity: denies   O:     Lab Results  Component Value Date   WBC 8.1 06/10/2019   HGB 13.3 06/10/2019   HCT 39.8 06/10/2019   MCV 93 06/10/2019   PLT 237 06/10/2019      Chemistry      Component Value Date/Time   NA 140 06/10/2019 0810   K 4.5 06/10/2019 0810   CL 103 06/10/2019 0810   CO2 24 06/10/2019 0810   BUN 12 06/10/2019 0810   CREATININE 0.92 06/10/2019 0810   CREATININE 0.83 11/19/2018 1127      Component Value Date/Time   CALCIUM 9.5 06/10/2019 0810   ALKPHOS 67 06/10/2019 0810   AST 25 06/10/2019 0810   ALT 21 06/10/2019 0810   BILITOT 0.5 06/10/2019 0810       A/P: 1. Medication review: Patient is on Cosentyx for psoriatic arthritis and is tolerating it well with no adverse effects. Reviewed the medication with him, including the following: Cosentyx an antibody used to treat inflammatory diseases, such as psoriasis and psoriatic arthritis. While on the medication, the patient is at an increased risk of infection. Patient should let rheumatologist know of any s/sx of infection. Possible adverse effects include hypersensitivity reactions, infection, and GI upset. Patient aware of appropriate injection technique.  No recommendation for any changes.   Christella Hartigan, PharmD, BCPS, BCACP, CPP Clinical Pharmacist Practitioner  [phone]

## 2019-09-02 MED FILL — COSENTYX 300 MG DOSE-2 PENS: 150 | 28 days supply | Qty: 2 | Fill #2

## 2019-09-14 ENCOUNTER — Telehealth: Payer: Self-pay | Admitting: Interventional Cardiology

## 2019-09-14 DIAGNOSIS — R310 Gross hematuria: Secondary | ICD-10-CM | POA: Diagnosis not present

## 2019-09-14 DIAGNOSIS — Z125 Encounter for screening for malignant neoplasm of prostate: Secondary | ICD-10-CM | POA: Diagnosis not present

## 2019-09-14 NOTE — Telephone Encounter (Signed)
Pt c/o medication issue:  1. Name of Medication: clopidogrel (PLAVIX) 75 MG tablet  2. How are you currently taking this medication (dosage and times per day)? As directed  3. Are you having a reaction (difficulty breathing--STAT)? No  4. What is your medication issue? Patient states that he had blood in his urine and was wondering if this medication needed to be stopped.   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 He asks that when you call back to please leave a direct number for him to call back and not the main line he called this morning. He is very concerned.

## 2019-09-14 NOTE — Telephone Encounter (Signed)
Spoke with DOD, Dr. Radford Pax regarding pts frank blood in urine. Dr. Radford Pax recommended pt see PCP and states it is ok to hold Plavix until being seen by PCP. Left pt detailed msg per their request earlier this am. Advised to call back with concerns.

## 2019-09-14 NOTE — Telephone Encounter (Signed)
Pt states that this morning when he urinated he saw frank blood in the toilet, he reports that it has not happened since early this am. BP is 113/79 with HR of 66, afebrile, asymptomatic. Pt held his Plavix this morning because he is concerned about the bleeding. Pt states that he would like advisement on if he should continue Plavix or if he needs to be evaluated first.

## 2019-09-14 NOTE — Telephone Encounter (Signed)
Otherwise, we could do plavix only, no aspirin if urology feels this would be better.

## 2019-09-15 ENCOUNTER — Other Ambulatory Visit: Payer: Self-pay | Admitting: Urology

## 2019-09-15 DIAGNOSIS — R310 Gross hematuria: Secondary | ICD-10-CM

## 2019-09-15 NOTE — Telephone Encounter (Signed)
Left message for patient to call back  

## 2019-09-15 NOTE — Telephone Encounter (Signed)
Addressed with phone call.

## 2019-09-15 NOTE — Telephone Encounter (Signed)
Called and made patient aware of recommendations below. He verbalized understanding and thanked me for the call.

## 2019-09-15 NOTE — Telephone Encounter (Signed)
Follow Up ° °Patient is returning call. Please give patient a call back.  °

## 2019-09-15 NOTE — Telephone Encounter (Signed)
He can hold his Plavix 5 days before cystoscopy.  If it will be longer than 5 days from now, would restart the PLavix, no aspirin,.

## 2019-09-15 NOTE — Telephone Encounter (Signed)
Called and spoke to patient. He states that he saw a urologist yesterday and he wants to do a CT scan and a cystoscopy later. The patient is not taking ASA only plavix. He has not taken it yesterday or today. He has not had any recurrent episodes of bleeding. Urologist did not give recommendations on holding plavix.  Patient is wanting to know if he should restart his plavix or if he should switch to ASA.   Patient is also wanting to know if he needs to hold his plavix prior to cystoscopy how long [name] would be comfortable with him holding it.  Made patient aware that I will send to [name] for review and recommendation.

## 2019-09-16 MED FILL — METOPROLOL SUCCINATE ER 25: 25 | 90 days supply | Qty: 90 | Fill #2

## 2019-09-21 DIAGNOSIS — L400 Psoriasis vulgaris: Secondary | ICD-10-CM | POA: Diagnosis not present

## 2019-09-21 DIAGNOSIS — L720 Epidermal cyst: Secondary | ICD-10-CM | POA: Diagnosis not present

## 2019-09-21 DIAGNOSIS — D1801 Hemangioma of skin and subcutaneous tissue: Secondary | ICD-10-CM | POA: Diagnosis not present

## 2019-09-21 DIAGNOSIS — L821 Other seborrheic keratosis: Secondary | ICD-10-CM | POA: Diagnosis not present

## 2019-09-21 DIAGNOSIS — Z85828 Personal history of other malignant neoplasm of skin: Secondary | ICD-10-CM | POA: Diagnosis not present

## 2019-09-21 DIAGNOSIS — L853 Xerosis cutis: Secondary | ICD-10-CM | POA: Diagnosis not present

## 2019-09-22 ENCOUNTER — Encounter: Payer: Self-pay | Admitting: Neurology

## 2019-09-26 ENCOUNTER — Other Ambulatory Visit: Payer: Self-pay

## 2019-09-26 ENCOUNTER — Ambulatory Visit
Admission: RE | Admit: 2019-09-26 | Discharge: 2019-09-26 | Disposition: A | Discharge status: Home / Self Care | Payer: 59 | Source: Ambulatory Visit | Attending: Urology | Admitting: Urology

## 2019-09-26 DIAGNOSIS — N281 Cyst of kidney, acquired: Secondary | ICD-10-CM | POA: Diagnosis not present

## 2019-09-26 DIAGNOSIS — R310 Gross hematuria: Secondary | ICD-10-CM

## 2019-09-26 DIAGNOSIS — K5730 Diverticulosis of large intestine without perforation or abscess without bleeding: Secondary | ICD-10-CM | POA: Diagnosis not present

## 2019-09-26 MED ORDER — IOPAMIDOL (ISOVUE-300) INJECTION 61%
100.0000 mL | Freq: Once | INTRAVENOUS | Status: AC | PRN
  Administered 2019-09-26: 100 mL via INTRAVENOUS

## 2019-09-28 ENCOUNTER — Other Ambulatory Visit: Payer: Self-pay | Admitting: *Deleted

## 2019-09-28 DIAGNOSIS — I7772 Dissection of iliac artery: Secondary | ICD-10-CM

## 2019-10-03 ENCOUNTER — Telehealth: Payer: Self-pay | Admitting: Rheumatology

## 2019-10-03 MED FILL — COSENTYX 300 MG DOSE-2 PENS: 150 | 28 days supply | Qty: 2 | Fill #1

## 2019-10-03 NOTE — Telephone Encounter (Signed)
Ran test claim, plan pays. Patient probably needs refills.  Thanks! Beatriz Chancellor, CPhT

## 2019-10-03 NOTE — Telephone Encounter (Signed)
Notes recorded by Jettie Booze, MD on 06/12/2019 at 10:52 AM EDT  Lipids, liver, electrolytes, CBC all well controlled   Labs overdue Last RF 07/01/2019 Last appt 06/22/2019 Next appt 12/21/2019 TB Gold Negative 06/13/2019

## 2019-10-03 NOTE — Telephone Encounter (Signed)
Patient called stating Kim told him they are waiting on prior authorization for his Cosentyx.

## 2019-10-04 ENCOUNTER — Other Ambulatory Visit: Payer: Self-pay

## 2019-10-04 DIAGNOSIS — Z79899 Other long term (current) drug therapy: Secondary | ICD-10-CM

## 2019-10-04 MED ORDER — COSENTYX SENSOREADY (300 MG) 150 MG/ML ~~LOC~~ SOAJ: 300.0000 mg | SUBCUTANEOUS | 0 refills | Status: DC

## 2019-10-04 NOTE — Telephone Encounter (Signed)
Patient is due to update CBC and CMP.

## 2019-10-04 NOTE — Telephone Encounter (Signed)
Attempted to contact patient and left message on machine to advise patient he is due to update labs and to call with location so orders can be released. 30 day supply has been refilled.

## 2019-10-04 NOTE — Telephone Encounter (Signed)
Please contact patient. Thank you. 

## 2019-10-05 ENCOUNTER — Other Ambulatory Visit: Payer: Self-pay

## 2019-10-05 ENCOUNTER — Ambulatory Visit (HOSPITAL_COMMUNITY)
Admission: RE | Admit: 2019-10-05 | Discharge: 2019-10-05 | Disposition: A | Discharge status: Home / Self Care | Payer: 59 | Source: Ambulatory Visit | Attending: Cardiovascular Disease | Admitting: Cardiovascular Disease

## 2019-10-05 DIAGNOSIS — I7772 Dissection of iliac artery: Secondary | ICD-10-CM | POA: Diagnosis not present

## 2019-10-05 DIAGNOSIS — G4733 Obstructive sleep apnea (adult) (pediatric): Secondary | ICD-10-CM | POA: Diagnosis not present

## 2019-10-06 DIAGNOSIS — Z79899 Other long term (current) drug therapy: Secondary | ICD-10-CM | POA: Diagnosis not present

## 2019-10-07 ENCOUNTER — Other Ambulatory Visit: Payer: Self-pay | Admitting: Pharmacist

## 2019-10-07 ENCOUNTER — Other Ambulatory Visit: Payer: Self-pay

## 2019-10-07 LAB — CBC WITH DIFFERENTIAL/PLATELET
Basophils Absolute: 0.1 10*3/uL (ref 0.0–0.2)
Basos: 1 %
EOS (ABSOLUTE): 0.5 10*3/uL — ABNORMAL HIGH (ref 0.0–0.4)
Eos: 7 %
Hematocrit: 38.5 % (ref 37.5–51.0)
Hemoglobin: 13 g/dL (ref 13.0–17.7)
Immature Grans (Abs): 0 10*3/uL (ref 0.0–0.1)
Immature Granulocytes: 0 %
Lymphocytes Absolute: 1.9 10*3/uL (ref 0.7–3.1)
Lymphs: 28 %
MCH: 30.2 pg (ref 26.6–33.0)
MCHC: 33.8 g/dL (ref 31.5–35.7)
MCV: 90 fL (ref 79–97)
Monocytes Absolute: 0.6 10*3/uL (ref 0.1–0.9)
Monocytes: 9 %
Neutrophils Absolute: 3.6 10*3/uL (ref 1.4–7.0)
Neutrophils: 55 %
Platelets: 242 10*3/uL (ref 150–450)
RBC: 4.3 x10E6/uL (ref 4.14–5.80)
RDW: 11.8 % (ref 11.6–15.4)
WBC: 6.6 10*3/uL (ref 3.4–10.8)

## 2019-10-07 LAB — CMP14+EGFR
ALT: 18 IU/L (ref 0–44)
AST: 20 IU/L (ref 0–40)
Albumin/Globulin Ratio: 1.6 (ref 1.2–2.2)
Albumin: 4.2 g/dL (ref 3.8–4.8)
Alkaline Phosphatase: 79 IU/L (ref 39–117)
BUN/Creatinine Ratio: 15 (ref 10–24)
BUN: 13 mg/dL (ref 8–27)
Bilirubin Total: 0.4 mg/dL (ref 0.0–1.2)
CO2: 24 mmol/L (ref 20–29)
Calcium: 9.1 mg/dL (ref 8.6–10.2)
Chloride: 105 mmol/L (ref 96–106)
Creatinine, Ser: 0.89 mg/dL (ref 0.76–1.27)
GFR calc Af Amer: 106 mL/min/{1.73_m2} (ref >59)
GFR calc non Af Amer: 92 mL/min/{1.73_m2} (ref >59)
Globulin, Total: 2.7 g/dL (ref 1.5–4.5)
Glucose: 91 mg/dL (ref 65–99)
Potassium: 4.6 mmol/L (ref 3.5–5.2)
Sodium: 141 mmol/L (ref 134–144)
Total Protein: 6.9 g/dL (ref 6.0–8.5)

## 2019-10-07 LAB — LIPID PANEL
Chol/HDL Ratio: 2.8 ratio (ref 0.0–5.0)
Cholesterol, Total: 102 mg/dL (ref 100–199)
HDL: 37 mg/dL — ABNORMAL LOW (ref >39)
LDL Chol Calc (NIH): 45 mg/dL (ref 0–99)
Triglycerides: 107 mg/dL (ref 0–149)
VLDL Cholesterol Cal: 20 mg/dL (ref 5–40)

## 2019-10-07 MED ORDER — COSENTYX SENSOREADY (300 MG) 150 MG/ML ~~LOC~~ SOAJ: 300.0000 mg | SUBCUTANEOUS | 0 refills | Status: DC

## 2019-10-07 NOTE — Progress Notes (Signed)
HDL is low but is stable.  CBC and CMP are within normal limits.  Okay to refill Cosentyx for 90-day supply.

## 2019-10-07 NOTE — Progress Notes (Signed)
Last Visit: 06/22/2019 Next Visit: 12/21/2019 Labs: 10/06/2019 HDL is low but is stable. CBC and CMP are within normal limits. Okay to refill Cosentyx for 90-day supply. TB Gold: 06/13/2019   Okay to refill per Dr. Estanislado Pandy.

## 2019-10-19 ENCOUNTER — Ambulatory Visit: Payer: 59 | Admitting: Neurology

## 2019-10-25 MED FILL — COSENTYX 300 MG DOSE-2 PENS: 150 | 28 days supply | Qty: 2 | Fill #0

## 2019-10-30 MED FILL — CLOPIDOGREL 75 MG TABLET: 75 | 90 days supply | Qty: 90 | Fill #3

## 2019-11-05 DIAGNOSIS — G4733 Obstructive sleep apnea (adult) (pediatric): Secondary | ICD-10-CM | POA: Diagnosis not present

## 2019-12-05 HISTORY — PX: CYSTOSCOPY: SUR368

## 2019-12-06 DIAGNOSIS — G4733 Obstructive sleep apnea (adult) (pediatric): Secondary | ICD-10-CM | POA: Diagnosis not present

## 2019-12-07 DIAGNOSIS — N281 Cyst of kidney, acquired: Secondary | ICD-10-CM | POA: Diagnosis not present

## 2019-12-07 DIAGNOSIS — R310 Gross hematuria: Secondary | ICD-10-CM | POA: Diagnosis not present

## 2019-12-15 NOTE — Progress Notes (Signed)
Office Visit Note  Patient: Ricardo [name]             Date of Birth: 08/17/1956           MRN: [id number]             PCP: Shon Baton, MD Referring: Shon Baton, MD Visit Date: 12/21/2019 Occupation: '@GUAROCC' @  Subjective:  Joint stiffness.   History of Present Illness: Ricardo [name] is a 64 y.o. male with history of psoriatic arthritis and psoriasis.  He states he has intermittent discomfort in his right wrist joint.  He has occasional discomfort in his left ankle joint.  He has not noticed any joint swelling.  He complains of discomfort in his right CMC joint.  He does ultrasound during the day.  He also has noticed decreased range of motion of his cervical spine.  He denies any SI joint discomfort.  He denies any recent episode of Achilles tendinitis or plantar fasciitis.  He gives history of intermittent pedal edema.  He has been tolerating Cosentyx well.  He already had his COVID-19 vaccination.  Activities of Daily Living:  Patient reports morning stiffness for 0 minutes.   Patient Denies nocturnal pain.  Difficulty dressing/grooming: Denies Difficulty climbing stairs: Denies Difficulty getting out of chair: Denies Difficulty using hands for taps, buttons, cutlery, and/or writing: Denies  Review of Systems  Constitutional: Negative for fatigue.  HENT: Negative for mouth sores, mouth dryness and nose dryness.   Eyes: Negative for itching and dryness.  Respiratory: Negative for shortness of breath, apnea and difficulty breathing.   Cardiovascular: Negative for chest pain and palpitations.  Gastrointestinal: Negative for blood in stool, constipation and diarrhea.  Endocrine: Negative for increased urination.  Genitourinary: Negative for difficulty urinating and painful urination.  Musculoskeletal: Positive for arthralgias, joint pain and joint swelling. Negative for morning stiffness.  Skin: Negative for rash.  Allergic/Immunologic: Negative for susceptible to infections.    Neurological: Negative for dizziness, light-headedness, numbness, headaches, memory loss and weakness.  Hematological: Positive for bruising/bleeding tendency.  Psychiatric/Behavioral: Negative for confusion and sleep disturbance.    PMFS History:  Patient Active Problem List   Diagnosis Date Noted  . History of seizures as a child 07/21/2019  . Snoring 07/21/2019  . History of coronary artery disease 11/19/2018  . Current use of anticoagulant therapy 11/19/2018  . Psoriasis 11/19/2018  . Psoriatic arthritis ([place]) 11/19/2018  . High risk medication use 11/19/2018  . DDD (degenerative disc disease), cervical 11/19/2018  . Encounter for medication review 05/13/2018    Past Medical History:  Diagnosis Date  . Coronary heart disease    2 stent placed March 2019  . DDD (degenerative disc disease), cervical 11/19/2018  . H/O heart artery stent   . History of coronary artery disease 11/19/2018   Status post stent placement March 2019  . History of seizures    No seizures in over 20 years.   . Psoriasis 11/19/2018   clobetasol  . Psoriatic arthritis ([place])    On Cosentyx since 2017    Family History  Problem Relation Age of Onset  . Heart disease Father    Past Surgical History:  Procedure Laterality Date  . COLONOSCOPY  2007 & 2016   Normal  . CYSTOSCOPY Right 2009   Removal of Ureteric Stone  . CYSTOSCOPY  12/2019  . HEMORRHOID SURGERY  2004   External hemorrhoid removed   . HERNIA REPAIR    . INGUINAL HERNIA REPAIR  1976  . PILONIDAL  CYST / SINUS EXCISION  2001  . THORACOTOMY  1997   and biopsy  . UPPER GI ENDOSCOPY  2007 & 2016   Normal   Social History   Social History Narrative   ** Merged History Encounter **        There is no immunization history on file for this patient.   Objective: Vital Signs: BP 114/74 (BP Location: Left Arm, Patient Position: Sitting, Cuff Size: Normal)   Pulse 68   Resp 13   Ht '5\' 9"'  (1.753 m)   Wt 158 lb 3.2 oz (71.8 kg)    BMI 23.36 kg/m    Physical Exam Vitals and nursing note reviewed.  Constitutional:      Appearance: He is well-developed.  HENT:     Head: Normocephalic and atraumatic.  Eyes:     Conjunctiva/sclera: Conjunctivae normal.     Pupils: Pupils are equal, round, and reactive to light.  Cardiovascular:     Rate and Rhythm: Normal rate and regular rhythm.     Heart sounds: Normal heart sounds.  Pulmonary:     Effort: Pulmonary effort is normal.     Breath sounds: Normal breath sounds.  Abdominal:     General: Bowel sounds are normal.     Palpations: Abdomen is soft.  Musculoskeletal:     Cervical back: Normal range of motion and neck supple.  Skin:    General: Skin is warm and dry.     Capillary Refill: Capillary refill takes less than 2 seconds.  Neurological:     Mental Status: He is alert and oriented to person, place, and time.  Psychiatric:        Behavior: Behavior normal.      Musculoskeletal Exam: C-spine cleared range of motion.  He has some thoracic kyphosis which appears to be postural.  No SI joint tenderness was noted.  Shoulder joints, elbow joints, wrist joints with good range of motion.  He has some right CMC thickening and tenderness.  No synovitis was noted.  Hip joints, knee joints, ankles, MTPs and PIPs with good range of motion with no synovitis.  There was no evidence of Achilles tendinitis or plantar fasciitis.  CDAI Exam: CDAI Score: -- Patient Global: --; Provider Global: -- Swollen: --; Tender: -- Joint Exam 12/21/2019   No joint exam has been documented for this visit   There is currently no information documented on the homunculus. Go to the Rheumatology activity and complete the homunculus joint exam.  Investigation: No additional findings.  Imaging: No results found.  Recent Labs: Lab Results  Component Value Date   WBC 6.6 10/06/2019   HGB 13.0 10/06/2019   PLT 242 10/06/2019   NA 141 10/06/2019   K 4.6 10/06/2019   CL 105 10/06/2019    CO2 24 10/06/2019   GLUCOSE 91 10/06/2019   BUN 13 10/06/2019   CREATININE 0.89 10/06/2019   BILITOT 0.4 10/06/2019   ALKPHOS 79 10/06/2019   AST 20 10/06/2019   ALT 18 10/06/2019   PROT 6.9 10/06/2019   ALBUMIN 4.2 10/06/2019   CALCIUM 9.1 10/06/2019   GFRAA 106 10/06/2019   QFTBGOLDPLUS Negative 06/13/2019    Speciality Comments: No specialty comments available.  Procedures:  No procedures performed Allergies: Patient has no allergy information on record.   Assessment / Plan:     Visit Diagnoses: Psoriatic arthritis ([place]) - diagnosed in 1999, treated by Dr. Kerry Hough in [place] -he had inadequate response to methotrexate, Enbrel, Humira.  He  has been doing well on Cosentyx.  He states he has occasional discomfort in his right wrist joint and his left ankle joint.  No warmth swelling or effusion was noted.  High risk medication use - Cosentyx 300 mg every 28 days.  His labs are up to date.  Have advised him to get labs every 3 months to monitor for drug toxicity.  Psoriasis-he has no active lesions of psoriasis currently.  Arthritis of carpometacarpal (CMC) joint of right thumb-he has chronic pain and discomfort in his right CMC joint.  Joint protection was discussed.  He does have a CMC brace.  Neck stiffness-he has been experiencing some stiffness in his neck and limited range of motion.  He also has some thoracic kyphosis.  Have given him some neck and back exercises handout.  Current use of anticoagulant therapy-he is on Plavix for coronary artery disease.  He gives history of recent hematuria for which he had urology work-up which was negative.  History of coronary artery disease - Status post stent placement March 2019   Obstructive sleep apnea syndrome -it is a recent diagnosis and he is on on CPAP  Orders: Orders Placed This Encounter  Procedures  . CMP14+EGFR  . QuantiFERON-TB Gold Plus   Meds ordered this encounter  Medications  . COSENTYX SENSOREADY, 300 MG,  150 MG/ML SOAJ    Sig: Inject 300 mg into the skin every 28 (twenty-eight) days.    Dispense:  6 pen    Refill:  0      Follow-Up Instructions: Return in about 6 months (around 06/19/2020) for Psoriatic arthritis.   Bo Merino, MD  Note - This record has been created using Editor, commissioning.  Chart creation errors have been sought, but may not always  have been located. Such creation errors do not reflect on  the standard of medical care.

## 2019-12-21 ENCOUNTER — Encounter: Payer: Self-pay | Admitting: Rheumatology

## 2019-12-21 ENCOUNTER — Ambulatory Visit (INDEPENDENT_AMBULATORY_CARE_PROVIDER_SITE_OTHER): Payer: 59 | Admitting: Rheumatology

## 2019-12-21 ENCOUNTER — Other Ambulatory Visit: Payer: Self-pay

## 2019-12-21 VITALS — BP 114/74 | HR 68 | Resp 13 | Ht 69.0 in | Wt 158.2 lb

## 2019-12-21 DIAGNOSIS — Z79899 Other long term (current) drug therapy: Secondary | ICD-10-CM

## 2019-12-21 DIAGNOSIS — G4733 Obstructive sleep apnea (adult) (pediatric): Secondary | ICD-10-CM | POA: Diagnosis not present

## 2019-12-21 DIAGNOSIS — M436 Torticollis: Secondary | ICD-10-CM | POA: Diagnosis not present

## 2019-12-21 DIAGNOSIS — L409 Psoriasis, unspecified: Secondary | ICD-10-CM

## 2019-12-21 DIAGNOSIS — Z7901 Long term (current) use of anticoagulants: Secondary | ICD-10-CM | POA: Diagnosis not present

## 2019-12-21 DIAGNOSIS — Z8679 Personal history of other diseases of the circulatory system: Secondary | ICD-10-CM

## 2019-12-21 DIAGNOSIS — L4050 Arthropathic psoriasis, unspecified: Secondary | ICD-10-CM

## 2019-12-21 DIAGNOSIS — M1811 Unilateral primary osteoarthritis of first carpometacarpal joint, right hand: Secondary | ICD-10-CM | POA: Diagnosis not present

## 2019-12-21 MED ORDER — COSENTYX SENSOREADY (300 MG) 150 MG/ML ~~LOC~~ SOAJ: 300.0000 mg | SUBCUTANEOUS | 0 refills | Status: DC

## 2019-12-21 NOTE — Patient Instructions (Signed)
Back Exercises The following exercises strengthen the muscles that help to support the trunk and back. They also help to keep the lower back flexible. Doing these exercises can help to prevent back pain or lessen existing pain.  If you have back pain or discomfort, try doing these exercises 2-3 times each day or as told by your health care provider.  As your pain improves, do them once each day, but increase the number of times that you repeat the steps for each exercise (do more repetitions).  To prevent the recurrence of back pain, continue to do these exercises once each day or as told by your health care provider. Do exercises exactly as told by your health care provider and adjust them as directed. It is normal to feel mild stretching, pulling, tightness, or discomfort as you do these exercises, but you should stop right away if you feel sudden pain or your pain gets worse. Exercises Single knee to chest Repeat these steps 3-5 times for each leg: 1. Lie on your back on a firm bed or the floor with your legs extended. 2. Bring one knee to your chest. Your other leg should stay extended and in contact with the floor. 3. Hold your knee in place by grabbing your knee or thigh with both hands and hold. 4. Pull on your knee until you feel a gentle stretch in your lower back or buttocks. 5. Hold the stretch for 10-30 seconds. 6. Slowly release and straighten your leg. Pelvic tilt Repeat these steps 5-10 times: 1. Lie on your back on a firm bed or the floor with your legs extended. 2. Bend your knees so they are pointing toward the ceiling and your feet are flat on the floor. 3. Tighten your lower abdominal muscles to press your lower back against the floor. This motion will tilt your pelvis so your tailbone points up toward the ceiling instead of pointing to your feet or the floor. 4. With gentle tension and even breathing, hold this position for 5-10 seconds. Cat-cow Repeat these steps until  your lower back becomes more flexible: 1. Get into a hands-and-knees position on a firm surface. Keep your hands under your shoulders, and keep your knees under your hips. You may place padding under your knees for comfort. 2. Let your head hang down toward your chest. Contract your abdominal muscles and point your tailbone toward the floor so your lower back becomes rounded like the back of a cat. 3. Hold this position for 5 seconds. 4. Slowly lift your head, let your abdominal muscles relax and point your tailbone up toward the ceiling so your back forms a sagging arch like the back of a cow. 5. Hold this position for 5 seconds.  Press-ups Repeat these steps 5-10 times: 1. Lie on your abdomen (face-down) on the floor. 2. Place your palms near your head, about shoulder-width apart. 3. Keeping your back as relaxed as possible and keeping your hips on the floor, slowly straighten your arms to raise the top half of your body and lift your shoulders. Do not use your back muscles to raise your upper torso. You may adjust the placement of your hands to make yourself more comfortable. 4. Hold this position for 5 seconds while you keep your back relaxed. 5. Slowly return to lying flat on the floor.  Bridges Repeat these steps 10 times: 1. Lie on your back on a firm surface. 2. Bend your knees so they are pointing toward the ceiling and   your feet are flat on the floor. Your arms should be flat at your sides, next to your body. 3. Tighten your buttocks muscles and lift your buttocks off the floor until your waist is at almost the same height as your knees. You should feel the muscles working in your buttocks and the back of your thighs. If you do not feel these muscles, slide your feet 1-2 inches farther away from your buttocks. 4. Hold this position for 3-5 seconds. 5. Slowly lower your hips to the starting position, and allow your buttocks muscles to relax completely. If this exercise is too easy, try  doing it with your arms crossed over your chest. Abdominal crunches Repeat these steps 5-10 times: 1. Lie on your back on a firm bed or the floor with your legs extended. 2. Bend your knees so they are pointing toward the ceiling and your feet are flat on the floor. 3. Cross your arms over your chest. 4. Tip your chin slightly toward your chest without bending your neck. 5. Tighten your abdominal muscles and slowly raise your trunk (torso) high enough to lift your shoulder blades a tiny bit off the floor. Avoid raising your torso higher than that because it can put too much stress on your low back and does not help to strengthen your abdominal muscles. 6. Slowly return to your starting position. Back lifts Repeat these steps 5-10 times: 1. Lie on your abdomen (face-down) with your arms at your sides, and rest your forehead on the floor. 2. Tighten the muscles in your legs and your buttocks. 3. Slowly lift your chest off the floor while you keep your hips pressed to the floor. Keep the back of your head in line with the curve in your back. Your eyes should be looking at the floor. 4. Hold this position for 3-5 seconds. 5. Slowly return to your starting position. Contact a health care provider if:  Your back pain or discomfort gets much worse when you do an exercise.  Your worsening back pain or discomfort does not lessen within 2 hours after you exercise. If you have any of these problems, stop doing these exercises right away. Do not do them again unless your health care provider says that you can. Get help right away if:  You develop sudden, severe back pain. If this happens, stop doing the exercises right away. Do not do them again unless your health care provider says that you can. This information is not intended to replace advice given to you by your health care provider. Make sure you discuss any questions you have with your health care provider. Document Revised: 02/24/2019 Document  Reviewed: 07/22/2018 Elsevier Patient Education  [place].  Cervical Strain and Sprain Rehab Ask your health care provider which exercises are safe for you. Do exercises exactly as told by your health care provider and adjust them as directed. It is normal to feel mild stretching, pulling, tightness, or discomfort as you do these exercises. Stop right away if you feel sudden pain or your pain gets worse. Do not begin these exercises until told by your health care provider. Stretching and range-of-motion exercises Cervical side bending  1. Using good posture, sit on a stable chair or stand up. 2. Without moving your shoulders, slowly tilt your left / right ear to your shoulder until you feel a stretch in the opposite side neck muscles. You should be looking straight ahead. 3. Hold for __________ seconds. 4. Repeat with the other  side of your neck. Repeat __________ times. Complete this exercise __________ times a day. Cervical rotation  1. Using good posture, sit on a stable chair or stand up. 2. Slowly turn your head to the side as if you are looking over your left / right shoulder. ? Keep your eyes level with the ground. ? Stop when you feel a stretch along the side and the back of your neck. 3. Hold for __________ seconds. 4. Repeat this by turning to your other side. Repeat __________ times. Complete this exercise __________ times a day. Thoracic extension and pectoral stretch 1. Roll a towel or a small blanket so it is about 4 inches (10 cm) in diameter. 2. Lie down on your back on a firm surface. 3. Put the towel lengthwise, under your spine in the middle of your back. It should not be under your shoulder blades. The towel should line up with your spine from your middle back to your lower back. 4. Put your hands behind your head and let your elbows fall out to your sides. 5. Hold for __________ seconds. Repeat __________ times. Complete this exercise __________ times a  day. Strengthening exercises Isometric upper cervical flexion 1. Lie on your back with a thin pillow behind your head and a small rolled-up towel under your neck. 2. Gently tuck your chin toward your chest and nod your head down to look toward your feet. Do not lift your head off the pillow. 3. Hold for __________ seconds. 4. Release the tension slowly. Relax your neck muscles completely before you repeat this exercise. Repeat __________ times. Complete this exercise __________ times a day. Isometric cervical extension  1. Stand about 6 inches (15 cm) away from a wall, with your back facing the wall. 2. Place a soft object, about 6-8 inches (15-20 cm) in diameter, between the back of your head and the wall. A soft object could be a small pillow, a ball, or a folded towel. 3. Gently tilt your head back and press into the soft object. Keep your jaw and forehead relaxed. 4. Hold for __________ seconds. 5. Release the tension slowly. Relax your neck muscles completely before you repeat this exercise. Repeat __________ times. Complete this exercise __________ times a day. Posture and body mechanics Body mechanics refers to the movements and positions of your body while you do your daily activities. Posture is part of body mechanics. Good posture and healthy body mechanics can help to relieve stress in your body's tissues and joints. Good posture means that your spine is in its natural S-curve position (your spine is neutral), your shoulders are pulled back slightly, and your head is not tipped forward. The following are general guidelines for applying improved posture and body mechanics to your everyday activities. Sitting  1. When sitting, keep your spine neutral and keep your feet flat on the floor. Use a footrest, if necessary, and keep your thighs parallel to the floor. Avoid rounding your shoulders, and avoid tilting your head forward. 2. When working at a desk or a computer, keep your desk at a  height where your hands are slightly lower than your elbows. Slide your chair under your desk so you are close enough to maintain good posture. 3. When working at a computer, place your monitor at a height where you are looking straight ahead and you do not have to tilt your head forward or downward to look at the screen. Standing   When standing, keep your spine neutral and keep  your feet about hip-width apart. Keep a slight bend in your knees. Your ears, shoulders, and hips should line up.  When you do a task in which you stand in one place for a long time, place one foot up on a stable object that is 2-4 inches (5-10 cm) high, such as a footstool. This helps keep your spine neutral. Resting When lying down and resting, avoid positions that are most painful for you. Try to support your neck in a neutral position. You can use a contour pillow or a small rolled-up towel. Your pillow should support your neck but not push on it. This information is not intended to replace advice given to you by your health care provider. Make sure you discuss any questions you have with your health care provider. Document Revised: 02/09/2019 Document Reviewed: 07/21/2018 Elsevier Patient Education  [place] We placed an order today for your standing lab work.    Please come back and get your standing labs in march and every 3 months We have open lab daily Monday through Thursday from 8:30-12:30 PM and 1:30-4:30 PM and Friday from 8:30-12:30 PM and 1:30-4:00 PM at the office of Dr. Bo Merino.   You may experience shorter wait times on Monday and Friday afternoons. The office is located at [address] [place], [place], [place] [address] No appointment is necessary.   Labs are drawn by [facility].  You may receive a bill from [facility] for your lab work.  If you wish to have your labs drawn at another location, please call the office 24 hours in advance to send orders.  If you  have any questions regarding directions or hours of operation,  please call [phone].   Just as a reminder please drink plenty of water prior to coming for your lab work. Thanks!

## 2019-12-25 ENCOUNTER — Encounter: Payer: Self-pay | Admitting: Rheumatology

## 2019-12-26 MED ORDER — DICLOFENAC SODIUM 1 % EX GEL: CUTANEOUS | 2 refills | Status: DC

## 2019-12-26 MED FILL — NITROGLYCERIN 0.4 MG TAB SL: 0.4 | 8 days supply | Qty: 25 | Fill #1

## 2019-12-26 MED FILL — DICLOFENAC SODIUM 1 % GEL: 1 | 12 days supply | Qty: 200 | Fill #0

## 2019-12-26 NOTE — Telephone Encounter (Signed)
Last Visit: 12/21/19 Next visit: 06/19/20  Patient requesting a prescription for Voltaren Gel. Okay to send in a prescription for Voltaren Gel?

## 2019-12-26 NOTE — Telephone Encounter (Signed)
Ok to refill voltaren gel

## 2019-12-28 ENCOUNTER — Other Ambulatory Visit: Payer: Self-pay

## 2019-12-28 ENCOUNTER — Encounter: Payer: Self-pay | Admitting: Neurology

## 2019-12-28 ENCOUNTER — Ambulatory Visit (INDEPENDENT_AMBULATORY_CARE_PROVIDER_SITE_OTHER): Payer: 59 | Admitting: Neurology

## 2019-12-28 VITALS — BP 106/65 | HR 65 | Temp 97.6°F | Ht 69.0 in | Wt 157.0 lb

## 2019-12-28 DIAGNOSIS — F458 Other somatoform disorders: Secondary | ICD-10-CM | POA: Diagnosis not present

## 2019-12-28 DIAGNOSIS — R682 Dry mouth, unspecified: Secondary | ICD-10-CM

## 2019-12-28 DIAGNOSIS — I7772 Dissection of iliac artery: Secondary | ICD-10-CM | POA: Diagnosis not present

## 2019-12-28 DIAGNOSIS — Z9989 Dependence on other enabling machines and devices: Secondary | ICD-10-CM

## 2019-12-28 DIAGNOSIS — G4733 Obstructive sleep apnea (adult) (pediatric): Secondary | ICD-10-CM

## 2019-12-28 NOTE — Progress Notes (Signed)
SLEEP MEDICINE CLINIC    Provider:  Larey Seat, MD  Primary Care Physician:  Shon Baton, MD [place] [address]     Referring Provider: Shon Baton, [place],  [place] [address]          Chief Complaint according to patient   Patient presents with:    . 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 His first consultation was on 07-21-2019  upon referral by Dr Virgina Jock,    He was to great surprise diagnosed with a rather severe form of sleep apnea.  His sleep study was performed how can I link my epic -integrated dragon to my NUANCE PRO handheld microphone? Larey Seat, M.D. 08-06-2019.ccnewpatient Slept for 57.6% of the recorded time.  The oxygen nadir was 78% the total time spent below 89% SPO2 equal 20 minutes.  There were several arousals most of them spontaneous but 43 of them associated with respiratory events.  There were 106 obstructive apneas 1 mixed apnea and 19 shallow breathing spells-hypopneas.  This amounts to an AHI per hour of sleep of 32.1.  In rem sleep the apnea index was accelerated to 52.1/h and the patient briefly resumed supine sleep he was noted to snore loudly.  He started  Using an auto CPAP, would ave liked to use a dental device , but his kind of apnea is not effectively responding to dental devices.   [name] has used his machine 27 out of 30 days, with an average use at time of 5 hours and 4 minutes.  He is using an air sense 10 AutoSet with a serial #[id number] the minimum pressure is 6 the maximum pressure of 16 cmH2O with 1 cm expiratory pressure relief.  The 95 percentile pressure is 8.5 cmH2O well within his current settings.  His residual AHI is 0.3/h which is an excellent resolution of his not mild baseline apnea.  No central apneas have emerged.   He used first a  FFM but had aerophagia and now is on nasal pillows.  He hasn't been sleepy in daytime an that hasn't changed, normally gets a good 5 hours of sleep in bloc, and then another 1-2 hours.    Given the symptoms of dry mouth and ongoing burping, and dry airway- throat irritation, feeling parched.   we can attribute this to oral air flow.     Last visit_ Chief concern according to patient : " I am woken up in the middle of the night with palpitations, and my wife stated I snore more"  and " I also have a brother with OSA diagnosis. I had a cardiac stent placed last year, followed by [name]".   I have the pleasure of seeing 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  with a possible sleep disorder.  She has a  has a past medical history of Coronary heart disease, DDD (degenerative disc disease), cervical (11/19/2018), H/O heart artery stent, History of coronary artery disease (11/19/2018), History of seizures, Psoriasis (11/19/2018), and Psoriatic arthritis ([place])..  He had a seizure disorder in his teens and early twenties, had an overnight study in [place], that was in 1970. He took Carbamazepine for 20 years. He  is not sure how to differentiate a  seizure form a nightmare, it felt similar. He was observed to have rhythmic head movements. He felt as if being touched in his sleep. Never had any event in daytime. He experienced sleep paralysis. He has been followed by psoriatic arthritis.     Family medical /sleep history: one brother with OSA,.brother had CAD and Stent.     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 The couple has no children,.  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 Tobacco use: in college - now a party cigar smoker, none since stent .  ETOH use: drink 1 /month , Caffeine intake in form of Coffee( 2-3 /d) Soda( none ) Tea (  none ) ,no  energy drinks. Regular exercise in form of walking , was a regular at the gym.        Sleep habits are as follows: The patient's dinner time is between 8.30 PM.  The patient goes to bed at 10.30- 11  PM and continues to sleep for many hours, wakes rarely for bathroom breaks, he mostly wakes spontaneously due to palpitations( until 2 month ago- not sure what changed) the first time at 3-4 AM.   The preferred sleep position is on his left, with the support of 2 pillows. Dreams are reportedly frequent/vivid.  6.30  AM is the usual rise time. The patient wakes up spontaneously but has an alarm set, too.   He reports  feeling refreshed and  restored in AM, with symptoms such as dry mouth, dry throat, but no morning headaches and residual fatigue.  Naps are taken infrequently,very rarely.    Review of Systems: Out of a complete 14 system review, the patient complains of only the following symptoms, and all other reviewed systems are negative.:   fragmented sleep, autoimmune disease, history of " seizures"    How likely are you to doze in the following situations: 0 = not likely, 1 = slight chance, 2 = moderate chance, 3 = [name] chance   Sitting and Reading? Watching Television? Sitting inactive in a public place (theater or meeting)? As a passenger in a car for an hour without a break? Lying down in the afternoon when circumstances permit? Sitting and talking to someone? Sitting quietly after lunch without alcohol? In a car, while stopped for a few minutes in traffic?   Total =3/ 24 points.   FSS endorsed at 17/ 63 points.   Social History   Socioeconomic History  . Marital status: Married    Spouse name: Not on file  . Number of children: Not on file  . Years of education: Not on file  . Highest education level: Not on file  Occupational History  . Not on file  Tobacco Use  . Smoking status: Former Smoker    Quit date: 1993    Years since quitting: 28.1  .  Smokeless tobacco: Never Used  Substance and Sexual Activity  . Alcohol use: Yes    Comment: occ  . Drug use: Never  . Sexual activity: Not on file  Other Topics Concern  . Not on file  Social History Narrative   ** Merged History Encounter **       Social Determinants of Health   Financial Resource Strain:   . Difficulty of Paying Living Expenses: Not on file  Food Insecurity:   .  Worried About Charity fundraiser in the Last Year: Not on file  . Ran Out of Food in the Last Year: Not on file  Transportation Needs:   . Lack of Transportation (Medical): Not on file  . Lack of Transportation (Non-Medical): Not on file  Physical Activity:   . Days of Exercise per Week: Not on file  . Minutes of Exercise per Session: Not on file  Stress:   . Feeling of Stress : Not on file  Social Connections:   . Frequency of Communication with Friends and Family: Not on file  . Frequency of Social Gatherings with Friends and Family: Not on file  . Attends Religious Services: Not on file  . Active Member of Clubs or Organizations: Not on file  . Attends Archivist Meetings: Not on file  . Marital Status: Not on file    Family History  Problem Relation Age of Onset  . Heart disease Father   Brother with OSA and CAD, stent   Past Medical History:  Diagnosis Date  . Coronary heart disease    2 stent placed March 2019  . DDD (degenerative disc disease), cervical 11/19/2018  . H/O heart artery stent   . History of coronary artery disease 11/19/2018   Status post stent placement March 2019  . History of seizures    No seizures in over 20 years.   . Psoriasis 11/19/2018   clobetasol  . Psoriatic arthritis ([place])    On Cosentyx since 2017    Past Surgical History:  Procedure Laterality Date  . COLONOSCOPY  2007 & 2016   Normal  . CYSTOSCOPY Right 2009   Removal of Ureteric Stone  . CYSTOSCOPY  12/2019  . HEMORRHOID SURGERY  2004   External hemorrhoid removed   . HERNIA  REPAIR    . INGUINAL HERNIA REPAIR  1976  . PILONIDAL CYST / SINUS EXCISION  2001  . THORACOTOMY  1997   and biopsy  . UPPER GI ENDOSCOPY  2007 & 2016   Normal     Current Outpatient Medications on File Prior to Visit  Medication Sig Dispense Refill  . atorvastatin (LIPITOR) 40 MG tablet Take 1 tablet (40 mg total) by mouth daily at 6 PM. 90 tablet 3  . clobetasol cream (TEMOVATE) [id number] % Apply 1 application topically 2 (two) times daily. (Patient taking differently: Apply 1 application topically as needed. ) 45 g 1  . clopidogrel (PLAVIX) 75 MG tablet Take 75 mg by mouth daily.    . Coenzyme Q10 200 MG capsule Take 200 mg by mouth daily.     . COSENTYX SENSOREADY, 300 MG, 150 MG/ML SOAJ Inject 300 mg into the skin every 28 (twenty-eight) days. 6 pen 0  . Cyanocobalamin (VITAMIN B12 PO) Take by mouth. Monday, Wednesday and Friday    . diclofenac Sodium (VOLTAREN) 1 % GEL Apply 2-4 grams to affected joint up to 4 times daily as needed 400 g 2  . metoprolol succinate (TOPROL-XL) 25 MG 24 hr tablet Take 1 tablet (25 mg total) by mouth daily. 90 tablet 3  . Multiple Vitamin (MULTIVITAMIN PO) Take by mouth daily.    . nitroGLYCERIN (NITROSTAT) 0.4 MG SL tablet Place 1 tablet (0.4 mg total) under the tongue every 5 (five) minutes as needed for chest pain. 25 tablet 3  . pantoprazole (PROTONIX) 20 MG tablet Take 20 mg by mouth as needed for heartburn or indigestion.     No current facility-administered medications on  file prior to visit.   Physical exam:  Today's Vitals   12/28/19 1520  BP: 106/65  Pulse: 65  Temp: 97.6 F (36.4 C)  Weight: 157 lb (71.2 kg)  Height: 5\' 9"  (1.753 m)   Body mass index is 23.18 kg/m.   Wt Readings from Last 3 Encounters:  12/28/19 157 lb (71.2 kg)  12/21/19 158 lb 3.2 oz (71.8 kg)  08/05/19 153 lb 1.9 oz (69.5 kg)     Ht Readings from Last 3 Encounters:  12/28/19 5\' 9"  (1.753 m)  12/21/19 5\' 9"  (1.753 m)  08/05/19 5\' 9"  (1.753 m)      General:  The patient is awake, alert and appears not in acute distress. The patient is well groomed. Head: Normocephalic, atraumatic. Neck is supple. Mallampati  3,  neck circumference:14. 75  inches . Nasal airflow s slightly congested.  Retrognathia is  seen.  Dental status:  Cardiovascular:  Regular rate and cardiac rhythm by pulse,  without distended neck veins. Respiratory: Lungs are clear to auscultation.  Skin:  Without evidence of ankle edema, or rash. Trunk: The patient's posture is erect.   Neurologic exam : The patient is awake and alert, oriented to place and time.   Memory subjective described as intact.  Attention span & concentration ability appears normal.  Speech is fluent,  without  dysarthria, dysphonia or aphasia.  Mood and affect are appropriate.   Cranial nerves: no loss of smell or taste reported  Pupils are equal and briskly reactive to light. Funduscopic exam  deferred. .   Facial motor strength is symmetric and tongue and uvula move midline. Neck ROM : rotation, tilt and flexion extension were normal for age and shoulder shrug was symmetrical.      Given the symptoms of dry mouth and ongoing burping, and dry airway- throat irritation, feeling parched.   We can attribute this to oral air flow.  RV in 9 month- I will order a new mask N 30 I , we fitted him today for this type.     After spending a total time of 30 minutes face to face and additional time for physical and neurologic examination, review of laboratory studies,  personal review of imaging studies, reports and results of other testing and review of referral information / records as far as provided in visit, I have established the following assessments: OSA on CPAP, therapy is effective but he has room for improvement on mask type.   I would like to thank Shon Baton, MD and  for allowing me to meet with and to take care of this pleasant patient.   In short, [name] will follow up either personally or  through our NP within the next 9 month.   CC: I will share my notes with [name].   Electronically signed by: Larey Seat, MD 12/28/2019 3:45 PM  [facility] [place] certified by The [employer] of Sleep Medicine and Diplomate of the [employer] of Sleep Medicine.  Board certified In Neurology through the [place], Fellow of the [employer] of Neurology. Medical Director of [employer].

## 2019-12-30 MED FILL — METOPROLOL SUCCINATE ER 25: 25 | 90 days supply | Qty: 90 | Fill #3

## 2020-01-02 MED FILL — COSENTYX 300 MG DOSE-2 PENS: 150 | 28 days supply | Qty: 2 | Fill #1

## 2020-01-03 DIAGNOSIS — G4733 Obstructive sleep apnea (adult) (pediatric): Secondary | ICD-10-CM | POA: Diagnosis not present

## 2020-01-10 ENCOUNTER — Encounter: Payer: Self-pay | Admitting: Rheumatology

## 2020-01-10 ENCOUNTER — Encounter: Payer: Self-pay | Admitting: Neurology

## 2020-01-13 ENCOUNTER — Other Ambulatory Visit: Payer: Self-pay | Admitting: Rheumatology

## 2020-01-13 DIAGNOSIS — Z79899 Other long term (current) drug therapy: Secondary | ICD-10-CM | POA: Diagnosis not present

## 2020-01-13 DIAGNOSIS — L4050 Arthropathic psoriasis, unspecified: Secondary | ICD-10-CM | POA: Diagnosis not present

## 2020-01-14 LAB — CMP14+EGFR
ALT: 19 IU/L (ref 0–44)
AST: 26 IU/L (ref 0–40)
Albumin/Globulin Ratio: 1.6 (ref 1.2–2.2)
Albumin: 4.4 g/dL (ref 3.8–4.8)
Alkaline Phosphatase: 79 IU/L (ref 39–117)
BUN/Creatinine Ratio: 15 (ref 10–24)
BUN: 15 mg/dL (ref 8–27)
Bilirubin Total: 0.2 mg/dL (ref 0.0–1.2)
CO2: 25 mmol/L (ref 20–29)
Calcium: 9.4 mg/dL (ref 8.6–10.2)
Chloride: 103 mmol/L (ref 96–106)
Creatinine, Ser: 1.03 mg/dL (ref 0.76–1.27)
GFR calc Af Amer: 89 mL/min/{1.73_m2} (ref >59)
GFR calc non Af Amer: 77 mL/min/{1.73_m2} (ref >59)
Globulin, Total: 2.8 g/dL (ref 1.5–4.5)
Glucose: 67 mg/dL (ref 65–99)
Potassium: 4.2 mmol/L (ref 3.5–5.2)
Sodium: 140 mmol/L (ref 134–144)
Total Protein: 7.2 g/dL (ref 6.0–8.5)

## 2020-01-16 DIAGNOSIS — G4733 Obstructive sleep apnea (adult) (pediatric): Secondary | ICD-10-CM | POA: Diagnosis not present

## 2020-01-17 ENCOUNTER — Telehealth: Payer: Self-pay | Admitting: *Deleted

## 2020-01-17 NOTE — Telephone Encounter (Signed)
Received CMP results.  Reviewed by Hazel Sams, PA-C Drawn on 01/13/20  CMP WNL

## 2020-01-18 NOTE — Progress Notes (Signed)
CMP is normal.

## 2020-01-23 ENCOUNTER — Other Ambulatory Visit: Payer: Self-pay | Admitting: Interventional Cardiology

## 2020-01-23 MED ORDER — CLOPIDOGREL BISULFATE 75 MG PO TABS: 75.0000 mg | ORAL_TABLET | Freq: Every day | ORAL | 1 refills | Status: DC

## 2020-01-23 MED FILL — CLOPIDOGREL 75 MG TABLET: 75 | 90 days supply | Qty: 90 | Fill #0

## 2020-01-23 MED FILL — ATORVASTATIN 40 MG TABLET: 40 | 90 days supply | Qty: 90 | Fill #1

## 2020-02-01 MED FILL — COSENTYX 300 MG DOSE-2 PENS: 150 | 28 days supply | Qty: 2 | Fill #2

## 2020-02-07 ENCOUNTER — Encounter: Payer: Self-pay | Admitting: Rheumatology

## 2020-02-08 NOTE — Telephone Encounter (Signed)
I reviewed the attached lab work.

## 2020-02-13 MED ORDER — METOPROLOL SUCCINATE ER 25 MG PO TB24: ORAL_TABLET | ORAL | 3 refills | Status: DC

## 2020-02-13 NOTE — Telephone Encounter (Signed)
Med list updated

## 2020-02-27 ENCOUNTER — Other Ambulatory Visit: Payer: Self-pay | Admitting: Rheumatology

## 2020-02-27 ENCOUNTER — Other Ambulatory Visit: Payer: Self-pay | Admitting: Pharmacist

## 2020-02-27 MED ORDER — COSENTYX SENSOREADY (300 MG) 150 MG/ML ~~LOC~~ SOAJ: 300.0000 mg | SUBCUTANEOUS | 0 refills | Status: DC

## 2020-02-27 MED FILL — COSENTYX 300 MG DOSE-2 PENS: 150 | 28 days supply | Qty: 2 | Fill #0

## 2020-02-27 NOTE — Telephone Encounter (Signed)
Last Visit: 12/21/2019  Next Visit: 06/19/2020 Labs: 01/13/2020 CMP is normal. 01/25/2020 eos (absoute) 0.5 TB Gold: 06/13/2019 negative   Okay to refill per Dr. Estanislado Pandy.

## 2020-03-26 MED FILL — COSENTYX 300 MG DOSE-2 PENS: 150 | 28 days supply | Qty: 2 | Fill #1

## 2020-04-11 DIAGNOSIS — G4733 Obstructive sleep apnea (adult) (pediatric): Secondary | ICD-10-CM | POA: Diagnosis not present

## 2020-04-17 ENCOUNTER — Other Ambulatory Visit: Payer: Self-pay | Admitting: *Deleted

## 2020-04-17 ENCOUNTER — Other Ambulatory Visit: Payer: Self-pay | Admitting: Interventional Cardiology

## 2020-04-17 MED ORDER — METOPROLOL SUCCINATE ER 25 MG PO TB24: ORAL_TABLET | ORAL | 3 refills | Status: DC

## 2020-04-17 MED FILL — METOPROLOL SUCCINATE ER 25: 25 | 90 days supply | Qty: 45 | Fill #0

## 2020-04-23 MED FILL — COSENTYX 300 MG DOSE-2 PENS: 150 | 28 days supply | Qty: 2 | Fill #2

## 2020-05-16 MED FILL — ATORVASTATIN CALCIUM 40 MG: 40 | 90 days supply | Qty: 90 | Fill #2

## 2020-05-16 MED FILL — CLOPIDOGREL 75 MG TABLET: 75 | 90 days supply | Qty: 90 | Fill #1

## 2020-05-21 ENCOUNTER — Other Ambulatory Visit: Payer: Self-pay | Admitting: *Deleted

## 2020-05-21 ENCOUNTER — Other Ambulatory Visit: Payer: Self-pay | Admitting: Pharmacist

## 2020-05-21 ENCOUNTER — Other Ambulatory Visit: Payer: Self-pay | Admitting: Rheumatology

## 2020-05-21 DIAGNOSIS — Z9225 Personal history of immunosupression therapy: Secondary | ICD-10-CM

## 2020-05-21 DIAGNOSIS — Z79899 Other long term (current) drug therapy: Secondary | ICD-10-CM

## 2020-05-21 MED ORDER — COSENTYX SENSOREADY (300 MG) 150 MG/ML ~~LOC~~ SOAJ: 300.0000 mg | SUBCUTANEOUS | 0 refills | Status: DC

## 2020-05-21 NOTE — Telephone Encounter (Signed)
Last Visit: 12/21/2019  Next Visit: 06/19/2020 Labs: 01/13/2020 CMP is normal. 01/25/2020 eos (absoute) 0.5 TB Gold: 06/13/2019 negative   Patient advised via my chart he is due to update labs.  Okay to refill 30 day supply Cosentyx?

## 2020-05-21 NOTE — Telephone Encounter (Signed)
Please add TB Gold to the labs for this afternoon.

## 2020-05-21 NOTE — Telephone Encounter (Signed)
TB Gold has been added.

## 2020-05-23 LAB — CBC WITH DIFFERENTIAL/PLATELET
Absolute Monocytes: 549 cells/uL (ref 200–950)
Basophils Absolute: 59 cells/uL (ref 0–200)
Basophils Relative: 1 %
Eosinophils Absolute: 248 cells/uL (ref 15–500)
Eosinophils Relative: 4.2 %
HCT: 40.3 % (ref 38.5–50.0)
Hemoglobin: 13.8 g/dL (ref 13.2–17.1)
Lymphs Abs: 1617 cells/uL (ref 850–3900)
MCH: 31 pg (ref 27.0–33.0)
MCHC: 34.2 g/dL (ref 32.0–36.0)
MCV: 90.6 fL (ref 80.0–100.0)
MPV: 10.4 fL (ref 7.5–12.5)
Monocytes Relative: 9.3 %
Neutro Abs: 3428 cells/uL (ref 1500–7800)
Neutrophils Relative %: 58.1 %
Platelets: 249 10*3/uL (ref 140–400)
RBC: 4.45 10*6/uL (ref 4.20–5.80)
RDW: 11.8 % (ref 11.0–15.0)
Total Lymphocyte: 27.4 %
WBC: 5.9 10*3/uL (ref 3.8–10.8)

## 2020-05-23 LAB — QUANTIFERON-TB GOLD PLUS
Mitogen-NIL: >10 IU/mL
NIL: 0.03 IU/mL
QuantiFERON-TB Gold Plus: NEGATIVE
TB1-NIL: 0.05 IU/mL
TB2-NIL: 0.02 IU/mL

## 2020-05-23 LAB — COMPLETE METABOLIC PANEL WITH GFR
AG Ratio: 1.5 (calc) (ref 1.0–2.5)
ALT: 14 U/L (ref 9–46)
AST: 18 U/L (ref 10–35)
Albumin: 4.3 g/dL (ref 3.6–5.1)
Alkaline phosphatase (APISO): 68 U/L (ref 35–144)
BUN: 11 mg/dL (ref 7–25)
CO2: 29 mmol/L (ref 20–32)
Calcium: 9.4 mg/dL (ref 8.6–10.3)
Chloride: 104 mmol/L (ref 98–110)
Creat: 0.91 mg/dL (ref 0.70–1.25)
GFR, Est African American: 104 mL/min/{1.73_m2} (ref >=60)
GFR, Est Non African American: 89 mL/min/{1.73_m2} (ref >=60)
Globulin: 2.9 g/dL (calc) (ref 1.9–3.7)
Glucose, Bld: 82 mg/dL (ref 65–99)
Potassium: 4.4 mmol/L (ref 3.5–5.3)
Sodium: 140 mmol/L (ref 135–146)
Total Bilirubin: 0.6 mg/dL (ref 0.2–1.2)
Total Protein: 7.2 g/dL (ref 6.1–8.1)

## 2020-05-24 MED FILL — COSENTYX 300 MG DOSE-2 PENS: 150 | 28 days supply | Qty: 2 | Fill #0

## 2020-06-05 DIAGNOSIS — G4733 Obstructive sleep apnea (adult) (pediatric): Secondary | ICD-10-CM | POA: Diagnosis not present

## 2020-06-07 NOTE — Progress Notes (Signed)
Office Visit Note  Patient: Ricardo [name]             Date of Birth: 03-Oct-1956           MRN: [id number]             PCP: Shon Baton, MD Referring: Shon Baton, MD Visit Date: 06/19/2020 Occupation: @GUAROCC @  Subjective:  Other (patient reports occ jaw pain, right hand pain/swelling )   History of Present Illness: Ricardo [name] is a 64 y.o. male with history of psoriatic arthritis, psoriasis and osteoarthritis.  He states he has been experiencing intermittent pain and swelling in his hands.  Currently he is having some swelling in his right hand, fifth finger.  He states he has been taking Cosentyx injections every 40 days.  He has also had occasional discomfort in the TMJ.  He has changed his diet to be plant-based.  He is also using CPAP which has been helpful.  He has been having some reflux symptoms.  He ran out of pantoprazole and requested a prescription today.  Activities of Daily Living:  Patient reports morning stiffness for a few minutes.   Patient Denies nocturnal pain.  Difficulty dressing/grooming: Denies Difficulty climbing stairs: Denies Difficulty getting out of chair: Denies Difficulty using hands for taps, buttons, cutlery, and/or writing: Denies  Review of Systems  Constitutional: Negative for fatigue.  HENT: Negative for mouth sores, mouth dryness and nose dryness.   Eyes: Negative for itching and dryness.  Respiratory: Negative for shortness of breath and difficulty breathing.   Cardiovascular: Negative for chest pain and palpitations.  Gastrointestinal: Positive for heartburn. Negative for blood in stool, constipation and diarrhea.  Endocrine: Negative for increased urination.  Genitourinary: Negative for difficulty urinating and painful urination.  Musculoskeletal: Positive for arthralgias, joint pain, joint swelling and morning stiffness. Negative for myalgias, muscle tenderness and myalgias.  Skin: Negative for color change, rash and redness.   Allergic/Immunologic: Negative for susceptible to infections.  Neurological: Negative for dizziness, numbness, headaches, memory loss and weakness.  Hematological: Negative for bruising/bleeding tendency.  Psychiatric/Behavioral: Negative for confusion.    PMFS History:  Patient Active Problem List   Diagnosis Date Noted  . 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 History of seizures as a child 07/21/2019  . Snoring 07/21/2019  . History of coronary artery disease 11/19/2018  . Current use of anticoagulant therapy 11/19/2018  . Psoriasis 11/19/2018  . Psoriatic arthritis ([place]) 11/19/2018  . High risk medication use 11/19/2018  . DDD (degenerative disc disease), cervical 11/19/2018  . Encounter for medication review 05/13/2018    Past Medical History:  Diagnosis Date  . Coronary heart disease    2 stent placed March 2019  . DDD (degenerative disc disease), cervical 11/19/2018  . H/O heart artery stent   . History of coronary artery disease 11/19/2018   Status post stent placement March 2019  . History of seizures    No seizures in over 20 years.   . Psoriasis 11/19/2018   clobetasol  . Psoriatic arthritis ([place])    On Cosentyx since 2017    Family History  Problem Relation Age of Onset  . Heart disease Father    Past Surgical History:  Procedure Laterality Date  . COLONOSCOPY  2007 & 2016   Normal  . CYSTOSCOPY Right 2009   Removal of Ureteric Stone  . CYSTOSCOPY  12/2019  . HEMORRHOID SURGERY  2004   External hemorrhoid removed   . HERNIA REPAIR    .  INGUINAL HERNIA REPAIR  1976  . PILONIDAL CYST / SINUS EXCISION  2001  . THORACOTOMY  1997   and biopsy  . UPPER GI ENDOSCOPY  2007 & 2016   Normal   Social History   Social History Narrative   ** Merged History Encounter **       Immunization History  Administered Date(s) Administered  . PFIZER SARS-COV-2 Vaccination 10/23/2019, 11/14/2019     Objective: Vital Signs: BP 120/71 (BP Location:  Left Arm, Patient Position: Sitting, Cuff Size: Normal)   Pulse 78   Resp 15   Ht 5\' 9"  (1.753 m)   Wt 156 lb 12.8 oz (71.1 kg)   BMI 23.16 kg/m    Physical Exam Vitals and nursing note reviewed.  Constitutional:      Appearance: He is well-developed.  HENT:     Head: Normocephalic and atraumatic.  Eyes:     Conjunctiva/sclera: Conjunctivae normal.     Pupils: Pupils are equal, round, and reactive to light.  Cardiovascular:     Rate and Rhythm: Normal rate and regular rhythm.     Heart sounds: Normal heart sounds.  Pulmonary:     Effort: Pulmonary effort is normal.     Breath sounds: Normal breath sounds.  Abdominal:     General: Bowel sounds are normal.     Palpations: Abdomen is soft.  Musculoskeletal:     Cervical back: Normal range of motion and neck supple.  Skin:    General: Skin is warm and dry.     Capillary Refill: Capillary refill takes less than 2 seconds.  Neurological:     Mental Status: He is alert and oriented to person, place, and time.  Psychiatric:        Behavior: Behavior normal.      Musculoskeletal Exam: C-spine thoracic and lumbar spine were in good range of motion.  He had no SI joint tenderness.  Shoulder joints, elbow joints, wrist joints with good range of motion.  He has synovitis in his right fifth PIP joint.  All other joints were in full range of motion.  Hip joints, knee joints, ankles, MTPs PIPs with good range of motion with no synovitis.  CDAI Exam: CDAI Score: 2.6  Patient Global: 3 mm; Provider Global: 3 mm Swollen: 1 ; Tender: 1  Joint Exam 06/19/2020      Right  Left  PIP 5  Swollen Tender        Investigation: No additional findings.  Imaging: No results found.  Recent Labs: Lab Results  Component Value Date   WBC 5.9 05/21/2020   HGB 13.8 05/21/2020   PLT 249 05/21/2020   NA 140 05/21/2020   K 4.4 05/21/2020   CL 104 05/21/2020   CO2 29 05/21/2020   GLUCOSE 82 05/21/2020   BUN 11 05/21/2020   CREATININE 0.91  05/21/2020   BILITOT 0.6 05/21/2020   ALKPHOS 79 01/13/2020   AST 18 05/21/2020   ALT 14 05/21/2020   PROT 7.2 05/21/2020   ALBUMIN 4.4 01/13/2020   CALCIUM 9.4 05/21/2020   GFRAA 104 05/21/2020   QFTBGOLDPLUS NEGATIVE 05/21/2020    Speciality Comments: No specialty comments available.  Procedures:  No procedures performed Allergies: Patient has no known allergies.   Assessment / Plan:     Visit Diagnoses: Psoriatic arthritis ([place]) - diagnosed in 1999, treated by Dr. Kerry Hough in [place] -he had inadequate response to methotrexate, Enbrel, Humira.  He has been on Cosentyx.  He has been having  intermittent swelling.  He states he has been spacing Cosentyx to every 40 days.  We had detailed discussion regarding better control of his disease.  He had synovitis in his right fifth DIP.  He has been having intermittent swelling in different joints.  I advised him to dose Cosentyx every 20 days as prescribed.  Psoriasis-he did not have any psoriasis lesions.  High risk medication use - Cosentyx 300 mg every 28 days.  His labs in July were within normal limits.  Have advised him to get labs every 3 months to monitor for drug toxicity.  TB gold in July was also negative.  Arthritis of carpometacarpal (CMC) joint of right thumb-he has off-and-on discomfort.  Joint protection was discussed.  Gastroesophageal reflux-He has been on pantoprazole in the past.  He states the symptoms of reflux has recurred.  Per his request prescription for pantoprazole 20 mg p.o. daily was called in.  He needs colonoscopy.  I offered GI referral.  He stated that he will let me know when he is ready.  Current use of anticoagulant therapy  Obstructive sleep apnea syndrome - on CPAP  History of coronary artery disease - Status post stent placement March 2019.  He is on plant-based diet now.  LMBEM-75 vaccination-he had COVID-19 vaccination in January.  We discussed booster dosing.  Detailed is counseling was provided  in the handout was placed in the AVS.  I also discussed in case he develops COVID-19 infection he would be a candidate for antibody infusion.  Orders: No orders of the defined types were placed in this encounter.  Meds ordered this encounter  Medications  . pantoprazole (PROTONIX) 20 MG tablet    Sig: Take 1 tablet (20 mg total) by mouth as needed for heartburn or indigestion.    Dispense:  30 tablet    Refill:  2      Follow-Up Instructions: Return in about 5 months (around 11/19/2020) for Psoriatic arthritis.   Bo Merino, MD  Note - This record has been created using Editor, commissioning.  Chart creation errors have been sought, but may not always  have been located. Such creation errors do not reflect on  the standard of medical care.

## 2020-06-18 ENCOUNTER — Other Ambulatory Visit: Payer: Self-pay | Admitting: Rheumatology

## 2020-06-18 ENCOUNTER — Other Ambulatory Visit: Payer: Self-pay | Admitting: Pharmacist

## 2020-06-18 MED ORDER — COSENTYX SENSOREADY (300 MG) 150 MG/ML ~~LOC~~ SOAJ: 300.0000 mg | SUBCUTANEOUS | 0 refills | Status: DC

## 2020-06-18 MED FILL — COSENTYX 300 MG DOSE-2 PENS: 150 | 28 days supply | Qty: 2 | Fill #0

## 2020-06-18 NOTE — Telephone Encounter (Signed)
Last Visit:12/21/2019 Next Visit:06/19/2020 Labs: 05/21/2020 CBC and CMP WNL  TB Gold: 05/21/2020 Neg   Current Dose per office note on 12/21/2019: Cosentyx 300 mg every 28 days Dx: Psoriatic arthritis   Okay to refill per Dr. Estanislado Pandy

## 2020-06-19 ENCOUNTER — Other Ambulatory Visit: Payer: Self-pay

## 2020-06-19 ENCOUNTER — Ambulatory Visit (INDEPENDENT_AMBULATORY_CARE_PROVIDER_SITE_OTHER): Payer: 59 | Admitting: Rheumatology

## 2020-06-19 ENCOUNTER — Encounter: Payer: Self-pay | Admitting: Rheumatology

## 2020-06-19 VITALS — BP 120/71 | HR 78 | Resp 15 | Ht 69.0 in | Wt 156.8 lb

## 2020-06-19 DIAGNOSIS — M1811 Unilateral primary osteoarthritis of first carpometacarpal joint, right hand: Secondary | ICD-10-CM

## 2020-06-19 DIAGNOSIS — Z79899 Other long term (current) drug therapy: Secondary | ICD-10-CM

## 2020-06-19 DIAGNOSIS — Z7901 Long term (current) use of anticoagulants: Secondary | ICD-10-CM

## 2020-06-19 DIAGNOSIS — L409 Psoriasis, unspecified: Secondary | ICD-10-CM | POA: Diagnosis not present

## 2020-06-19 DIAGNOSIS — G4733 Obstructive sleep apnea (adult) (pediatric): Secondary | ICD-10-CM | POA: Diagnosis not present

## 2020-06-19 DIAGNOSIS — Z8679 Personal history of other diseases of the circulatory system: Secondary | ICD-10-CM | POA: Diagnosis not present

## 2020-06-19 DIAGNOSIS — K2100 Gastro-esophageal reflux disease with esophagitis, without bleeding: Secondary | ICD-10-CM | POA: Diagnosis not present

## 2020-06-19 DIAGNOSIS — L4050 Arthropathic psoriasis, unspecified: Secondary | ICD-10-CM

## 2020-06-19 MED ORDER — PANTOPRAZOLE SODIUM 20 MG PO TBEC: 20.0000 mg | DELAYED_RELEASE_TABLET | ORAL | 2 refills | Status: DC | PRN

## 2020-06-19 MED FILL — PANTOPRAZOLE SOD DR 20 MG T: 20 | 90 days supply | Qty: 90 | Fill #0

## 2020-06-19 NOTE — Patient Instructions (Addendum)
° °  COVID-19 vaccine recommendations:   COVID-19 vaccine is recommended for everyone (unless you are allergic to a vaccine component), even if you are on a medication that suppresses your immune system.   If you are on Methotrexate, Cellcept (mycophenolate), Rinvoq, Morrie Sheldon, and Olumiant- hold the medication for 1 week after each vaccine. Hold Methotrexate for 2 weeks after the single dose COVID-19 vaccine.   If you are on Orencia subcutaneous injection - hold medication one week prior to and one week after the first COVID-19 vaccine dose (only).   If you are on Orencia IV infusions- time vaccination administration so that the first COVID-19 vaccination will occur four weeks after the infusion and postpone the subsequent infusion by one week.   If you are on Cyclophosphamide or Rituxan infusions please contact your doctor prior to receiving the COVID-19 vaccine.   Do not take Tylenol or ant anti-inflammatory medications (NSAIDs) 24 hours prior to the COVID-19 vaccination.   There is no direct evidence about the efficacy of the COVID-19 vaccine in individuals who are on medications that suppress the immune system.   Even if you are fully vaccinated, and you are on any medications that suppress your immune system, please continue to wear a mask, maintain at least six feet social distance and practice hand hygiene.   If you develop a COVID-19 infection, please contact your PCP or our office to determine if you need antibody infusion.  The booster vaccine is now available for immunocompromised patients. It is advised that if you had Pfizer vaccine you should get Coca-Cola booster.  If you had a Moderna vaccine then you should get a Moderna booster. Johnson and Wynetta Emery does not have a booster vaccine at this time.  Please see the following web sites for updated information.    https://[email or web address]/[email or web address]/0/Files/COVID-19-Vaccination-Patient-Resources.[email or web address]  [email or web address]://[email or web address]/About-Us/Newsroom/Press-Releases/ID/1159   Standing Labs We placed an order today for your standing lab work.   Please have your standing labs drawn in October and every 3 months  If possible, please have your labs drawn 2 weeks prior to your appointment so that the provider can discuss your results at your appointment.  We have open lab daily Monday through Thursday from 8:30-12:30 PM and 1:30-4:30 PM and Friday from 8:30-12:30 PM and 1:30-4:00 PM at the office of Dr. Bo Merino, [facility].   Please be advised, patients with office appointments requiring lab work will take precedents over walk-in lab work.  If possible, please come for your lab work on Monday and Friday afternoons, as you may experience shorter wait times. The office is located at [address], [place], [place], [place] [address] No appointment is necessary.   Labs are drawn by [facility]. Please bring your co-pay at the time of your lab draw.  You may receive a bill from [facility] for your lab work.  If you wish to have your labs drawn at another location, please call the office 24 hours in advance to send orders.  If you have any questions regarding directions or hours of operation,  please call [phone].   As a reminder, please drink plenty of water prior to coming for your lab work. Thanks!

## 2020-07-10 ENCOUNTER — Telehealth: Payer: Self-pay | Admitting: Pharmacy Technician

## 2020-07-10 DIAGNOSIS — Z125 Encounter for screening for malignant neoplasm of prostate: Secondary | ICD-10-CM | POA: Diagnosis not present

## 2020-07-10 DIAGNOSIS — Z0000 Encounter for general adult medical examination without abnormal findings: Secondary | ICD-10-CM | POA: Diagnosis not present

## 2020-07-10 DIAGNOSIS — E785 Hyperlipidemia, unspecified: Secondary | ICD-10-CM | POA: Diagnosis not present

## 2020-07-10 NOTE — Telephone Encounter (Signed)
Submitted a Prior Authorization request to [facility] for COSENTYX via Cover My Meds. Will update once we receive a response.   (Key: [id number]) - [id number]

## 2020-07-10 NOTE — Telephone Encounter (Signed)
Received notification from [facility] regarding a prior authorization for [place]. Authorization has been APPROVED from 07/10/20 to 07/10/21.   Authorization # [id number]

## 2020-07-13 ENCOUNTER — Ambulatory Visit: Payer: Self-pay | Attending: Internal Medicine

## 2020-07-13 DIAGNOSIS — Z23 Encounter for immunization: Secondary | ICD-10-CM

## 2020-07-13 NOTE — Progress Notes (Signed)
   Covid-19 Vaccination Clinic  Name:  Ricardo [name]    MRN: [id number] DOB: 17-Jul-1956  07/13/2020  [name] was observed post Covid-19 immunization for 15 minutes without incident. He was provided with Vaccine Information Sheet and instruction to access the V-Safe system.   [name] was instructed to call 911 with any severe reactions post vaccine: Marland Kitchen Difficulty breathing  . Swelling of face and throat  . A fast heartbeat  . A bad rash all over body  . Dizziness and weakness

## 2020-07-16 DIAGNOSIS — R82998 Other abnormal findings in urine: Secondary | ICD-10-CM | POA: Diagnosis not present

## 2020-07-16 DIAGNOSIS — L409 Psoriasis, unspecified: Secondary | ICD-10-CM | POA: Diagnosis not present

## 2020-07-16 DIAGNOSIS — Z0000 Encounter for general adult medical examination without abnormal findings: Secondary | ICD-10-CM | POA: Diagnosis not present

## 2020-07-16 DIAGNOSIS — M180 Bilateral primary osteoarthritis of first carpometacarpal joints: Secondary | ICD-10-CM | POA: Diagnosis not present

## 2020-07-16 DIAGNOSIS — C4491 Basal cell carcinoma of skin, unspecified: Secondary | ICD-10-CM | POA: Diagnosis not present

## 2020-07-16 DIAGNOSIS — L4050 Arthropathic psoriasis, unspecified: Secondary | ICD-10-CM | POA: Diagnosis not present

## 2020-07-16 DIAGNOSIS — M4306 Spondylolysis, lumbar region: Secondary | ICD-10-CM | POA: Diagnosis not present

## 2020-07-16 DIAGNOSIS — D8989 Other specified disorders involving the immune mechanism, not elsewhere classified: Secondary | ICD-10-CM | POA: Diagnosis not present

## 2020-07-16 DIAGNOSIS — K635 Polyp of colon: Secondary | ICD-10-CM | POA: Diagnosis not present

## 2020-07-16 DIAGNOSIS — N281 Cyst of kidney, acquired: Secondary | ICD-10-CM | POA: Diagnosis not present

## 2020-07-16 DIAGNOSIS — I2510 Atherosclerotic heart disease of native coronary artery without angina pectoris: Secondary | ICD-10-CM | POA: Diagnosis not present

## 2020-07-16 DIAGNOSIS — E785 Hyperlipidemia, unspecified: Secondary | ICD-10-CM | POA: Diagnosis not present

## 2020-08-08 ENCOUNTER — Other Ambulatory Visit: Payer: Self-pay | Admitting: Interventional Cardiology

## 2020-08-08 ENCOUNTER — Other Ambulatory Visit: Payer: Self-pay

## 2020-08-08 MED ORDER — CLOPIDOGREL BISULFATE 75 MG PO TABS: 75.0000 mg | ORAL_TABLET | Freq: Every day | ORAL | 0 refills | Status: DC

## 2020-08-08 MED FILL — COSENTYX 300 MG DOSE-2 PENS: 150 | 28 days supply | Qty: 2 | Fill #1

## 2020-08-08 MED FILL — CLOPIDOGREL 75 MG TABLET: 75 | 90 days supply | Qty: 90 | Fill #0

## 2020-08-23 ENCOUNTER — Ambulatory Visit: Payer: 59 | Admitting: Interventional Cardiology

## 2020-08-27 NOTE — Progress Notes (Signed)
Cardiology Office Note   Date:  08/29/2020   ID:  Aimar, Borghi 1956-06-24, MRN [id number]  PCP:  Shon Baton, MD    No chief complaint on file.  CAD  Wt Readings from Last 3 Encounters:  08/29/20 154 lb 9.6 oz (70.1 kg)  06/19/20 156 lb 12.8 oz (71.1 kg)  12/28/19 157 lb (71.2 kg)       History of Present Illness: Ricardo [name] is a 64 y.o. male  CAD.  Angina was an exertional chest fullness.  He had basal cell CA on the face several years ago.  He had exertional angina in early 2019, it was a fullness in his chest that was worse with walking up hills.  Father had CABG at 24.  He had He had an abnormal stress which led to cath.   CABG was considered. CTO of LAD and severe disease of OM. Both were stented via the left radial in Feb 2019. He asked for the left radial to be used.   3.0 x 38 [place] to LAD postdilated to 3.25 mm. 3.5 x 18 to OM postdilated to 5.0 mm  He has done well since then. He continues to exercise regularly.  He was switched to Plavix only in 02/2019.  Sleep study was ordered.  He did have apnea, and now uses CPAP.  He eats a vegetarian diet.   SHOB got better off of Brilinta. He added aspirin back, but stopped it after some stomach upset.  Has had gastric erosions in the past.   Since the last visit, he had an episode of SHOB and fatigue after doing a lot of physical activity.  He felt better the next day.  He walks regularly and hikes.   Denies : Chest pain. Dizziness. Leg edema. Nitroglycerin use. Orthopnea. Palpitations. Paroxysmal nocturnal dyspnea. Shortness of breath. Syncope.   No sx like his prior angina.   He continues to eat healthy.     Past Medical History:  Diagnosis Date  . Coronary heart disease    2 stent placed March 2019  . DDD (degenerative disc disease), cervical 11/19/2018  . H/O heart artery stent   . History of coronary artery disease 11/19/2018   Status post stent placement March 2019  . History  of seizures    No seizures in over 20 years.   . Psoriasis 11/19/2018   clobetasol  . Psoriatic arthritis ([place])    On Cosentyx since 2017    Past Surgical History:  Procedure Laterality Date  . COLONOSCOPY  2007 & 2016   Normal  . CYSTOSCOPY Right 2009   Removal of Ureteric Stone  . CYSTOSCOPY  12/2019  . HEMORRHOID SURGERY  2004   External hemorrhoid removed   . HERNIA REPAIR    . INGUINAL HERNIA REPAIR  1976  . PILONIDAL CYST / SINUS EXCISION  2001  . THORACOTOMY  1997   and biopsy  . UPPER GI ENDOSCOPY  2007 & 2016   Normal     Current Outpatient Medications  Medication Sig Dispense Refill  . atorvastatin (LIPITOR) 40 MG tablet Take 1 tablet (40 mg total) by mouth daily at 6 PM. 90 tablet 3  . clobetasol cream (TEMOVATE) 1.66 % Apply 1 application topically 2 (two) times daily. (Patient taking differently: Apply 1 application topically as needed. ) 45 g 1  . clopidogrel (PLAVIX) 75 MG tablet Take 1 tablet (75 mg total) by mouth daily. Please keep upcoming appt in October with [name]  for future refills. Thank you 90 tablet 0  . Coenzyme Q10 200 MG capsule Take 200 mg by mouth daily.     . COSENTYX SENSOREADY, 300 MG, 150 MG/ML SOAJ Inject 300 mg into the skin every 28 (twenty-eight) days. 6 mL 0  . Cyanocobalamin (VITAMIN B12 PO) Take by mouth. Monday- Thursday    . diclofenac Sodium (VOLTAREN) 1 % GEL Apply 2-4 grams to affected joint up to 4 times daily as needed 400 g 2  . metoprolol succinate (TOPROL-XL) 25 MG 24 hr tablet Take 1/2 tablet (12.5 mg total) by mouth once a day 90 tablet 3  . Multiple Vitamin (MULTIVITAMIN PO) Take by mouth daily.    . nitroGLYCERIN (NITROSTAT) 0.4 MG SL tablet Place 1 tablet (0.4 mg total) under the tongue every 5 (five) minutes as needed for chest pain. 25 tablet 3  . pantoprazole (PROTONIX) 20 MG tablet Take 1 tablet (20 mg total) by mouth as needed for heartburn or indigestion. 30 tablet 2   No current facility-administered  medications for this visit.    Allergies:   Patient has no known allergies.    Social History:  The patient  reports that he quit smoking about 28 years ago. He has never used smokeless tobacco. He reports current alcohol use. He reports that he does not use drugs.   Family History:  The patient's \ family history includes Heart disease in his father.    ROS:  Please see the history of present illness.   Otherwise, review of systems are positive for DOE with incline; hematuria x 1- negative cystoscopy ( Dr. Jeffie Pollock).   All other systems are reviewed and negative.    PHYSICAL EXAM: VS:  BP 116/74   Pulse 72   Ht 5\' 9"  (1.753 m)   Wt 154 lb 9.6 oz (70.1 kg)   SpO2 97%   BMI 22.83 kg/m  , BMI Body mass index is 22.83 kg/m. GEN: Well nourished, well developed, in no acute distress  HEENT: normal  Neck: no JVD, carotid bruits, or masses Cardiac: RRR; no murmurs, rubs, or gallops,no edema  Respiratory:  clear to auscultation bilaterally, normal work of breathing GI: soft, nontender, nondistended, + BS MS: no deformity or atrophy  Skin: warm and dry, no rash Neuro:  Strength and sensation are intact Psych: euthymic mood, full affect   EKG:   The ekg ordered today demonstrates NSR, no ST changes   Recent Labs: 05/21/2020: ALT 14; BUN 11; Creat 0.91; Hemoglobin 13.8; Platelets 249; Potassium 4.4; Sodium 140   Lipid Panel    Component Value Date/Time   CHOL 102 10/06/2019 0803   TRIG 107 10/06/2019 0803   HDL 37 (L) 10/06/2019 0803   CHOLHDL 2.8 10/06/2019 0803   LDLCALC 45 10/06/2019 0803     Other studies Reviewed: Additional studies/ records that were reviewed today with results demonstrating:Labs reviewed.     ASSESSMENT AND PLAN:  1. CAD: Refill SL NTG.  No angina.  COntinue aggressive secondary prevention.   SOme DOE which is new- check echo to eval LV function.   2. Family h/o early CAD: Brother is doing well. No change.  3. Hyperlipidemia: LDL 60 in 9/21.   COntinue statin.  4. Psoriatic arthritis:  Controlled.  Followed by Dr. Estanislado Pandy.  5. OSA:  Sleep study in 2020.  TOlerating CPAP.   6. Got COVID vaccines and flu shots.    Current medicines are reviewed at length with the patient today.  The patient  concerns regarding his medicines were addressed.  The following changes have been made:  No change  Labs/ tests ordered today include: echo No orders of the defined types were placed in this encounter.   Recommend 150 minutes/week of aerobic exercise Low fat, low carb, high fiber diet recommended  Disposition:   FU in 1 year   Signed, Larae Grooms, MD  08/29/2020 10:36 AM    [facility] [address], [place], [place]  [address] Phone: [phone]; Fax: [phone]

## 2020-08-29 ENCOUNTER — Ambulatory Visit (INDEPENDENT_AMBULATORY_CARE_PROVIDER_SITE_OTHER): Payer: 59 | Admitting: Interventional Cardiology

## 2020-08-29 ENCOUNTER — Encounter: Payer: Self-pay | Admitting: Interventional Cardiology

## 2020-08-29 ENCOUNTER — Other Ambulatory Visit: Payer: Self-pay

## 2020-08-29 VITALS — BP 116/74 | HR 72 | Ht 69.0 in | Wt 154.6 lb

## 2020-08-29 DIAGNOSIS — L4050 Arthropathic psoriasis, unspecified: Secondary | ICD-10-CM

## 2020-08-29 DIAGNOSIS — I2510 Atherosclerotic heart disease of native coronary artery without angina pectoris: Secondary | ICD-10-CM | POA: Diagnosis not present

## 2020-08-29 DIAGNOSIS — Z8249 Family history of ischemic heart disease and other diseases of the circulatory system: Secondary | ICD-10-CM

## 2020-08-29 DIAGNOSIS — E782 Mixed hyperlipidemia: Secondary | ICD-10-CM

## 2020-08-29 DIAGNOSIS — R0602 Shortness of breath: Secondary | ICD-10-CM | POA: Diagnosis not present

## 2020-08-29 NOTE — Patient Instructions (Signed)
Medication Instructions:  Your physician recommends that you continue on your current medications as directed. Please refer to the Current Medication list given to you today.  *If you need a refill on your cardiac medications before your next appointment, please call your pharmacy*   Lab Work: None   If you have labs (blood work) drawn today and your tests are completely normal, you will receive your results only by: Marland Kitchen MyChart Message (if you have MyChart) OR . A paper copy in the mail If you have any lab test that is abnormal or we need to change your treatment, we will call you to review the results.   Testing/Procedures: Your physician has requested that you have an echocardiogram. Echocardiography is a painless test that uses sound waves to create images of your heart. It provides your doctor with information about the size and shape of your heart and how well your heart's chambers and valves are working. This procedure takes approximately one hour. There are no restrictions for this procedure.  Follow-Up: At [facility], you and your health needs are our priority.  As part of our continuing mission to provide you with exceptional heart care, we have created designated Provider Care Teams.  These Care Teams include your primary Cardiologist (physician) and Advanced Practice Providers (APPs -  Physician Assistants and Nurse Practitioners) who all work together to provide you with the care you need, when you need it.  We recommend signing up for the patient portal called "MyChart".  Sign up information is provided on this After Visit Summary.  MyChart is used to connect with patients for Virtual Visits (Telemedicine).  Patients are able to view lab/test results, encounter notes, upcoming appointments, etc.  Non-urgent messages can be sent to your provider as well.   To learn more about what you can do with MyChart, go to [email or web address].    Your next appointment:   12  month(s)  The format for your next appointment:   In Person  Provider:   You may see Larae Grooms, MD or one of the following Advanced Practice Providers on your designated Care Team:    Melina Copa, PA-C  Ermalinda Barrios, PA-C  Other Instructions None

## 2020-08-31 ENCOUNTER — Other Ambulatory Visit: Payer: Self-pay | Admitting: Interventional Cardiology

## 2020-08-31 MED FILL — METOPROLOL SUCCINATE ER 25: 25 | 90 days supply | Qty: 45 | Fill #1

## 2020-08-31 MED FILL — ATORVASTATIN 40 MG TABLET: 40 | 90 days supply | Qty: 90 | Fill #0

## 2020-09-19 ENCOUNTER — Other Ambulatory Visit: Payer: Self-pay

## 2020-09-19 ENCOUNTER — Ambulatory Visit (HOSPITAL_COMMUNITY): Payer: 59 | Attending: Internal Medicine

## 2020-09-19 DIAGNOSIS — R0602 Shortness of breath: Secondary | ICD-10-CM | POA: Diagnosis not present

## 2020-09-19 LAB — ECHOCARDIOGRAM COMPLETE
Area-P 1/2: 3.39 cm2
S' Lateral: 2.6 cm

## 2020-09-21 ENCOUNTER — Telehealth: Payer: Self-pay | Admitting: Interventional Cardiology

## 2020-09-21 NOTE — Telephone Encounter (Signed)
Ricardo [name] is returning Ricardo [name]'s call in regards to his Echo results. Please advise.

## 2020-09-21 NOTE — Telephone Encounter (Signed)
The patient has been notified of the result and verbalized understanding.  All questions (if any) were answered. Cleon Gustin, RN 09/21/2020 1:21 PM

## 2020-09-21 NOTE — Telephone Encounter (Signed)
-----   Message from Jettie Booze, MD sent at 09/20/2020  9:24 PM EST ----- Normal LV/RV function.  Normal valvular function.

## 2020-09-24 MED FILL — COSENTYX 300 MG DOSE-2 PENS: 150 | 28 days supply | Qty: 2 | Fill #2

## 2020-10-01 DIAGNOSIS — R319 Hematuria, unspecified: Secondary | ICD-10-CM | POA: Diagnosis not present

## 2020-10-02 ENCOUNTER — Encounter: Payer: Self-pay | Admitting: Neurology

## 2020-10-02 ENCOUNTER — Ambulatory Visit (INDEPENDENT_AMBULATORY_CARE_PROVIDER_SITE_OTHER): Payer: 59 | Admitting: Neurology

## 2020-10-02 VITALS — BP 121/75 | HR 65 | Ht 69.0 in | Wt 155.5 lb

## 2020-10-02 DIAGNOSIS — L4050 Arthropathic psoriasis, unspecified: Secondary | ICD-10-CM | POA: Diagnosis not present

## 2020-10-02 DIAGNOSIS — Z9989 Dependence on other enabling machines and devices: Secondary | ICD-10-CM

## 2020-10-02 DIAGNOSIS — M5030 Other cervical disc degeneration, unspecified cervical region: Secondary | ICD-10-CM | POA: Diagnosis not present

## 2020-10-02 DIAGNOSIS — G4733 Obstructive sleep apnea (adult) (pediatric): Secondary | ICD-10-CM

## 2020-10-02 NOTE — Progress Notes (Signed)
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  His first consultation was on 07-21-2019  upon referral by Ricardo [name],   10-02-2020: Interval History, I have the pleasure of meeting today with Ricardo [name], he has become a highly compliant CPAP user 97% for days and hours with an average user time at night of 6 hours 29 minutes.  He is using an AutoSet with a minimum pressure setting of 6 at the maximum pressure of 16 cmH2O.  He has an expiratory relief setting of 1 cm.  His residual AHI is 0.1 apneas per hour this is an excellent resolution.  his wife is sleeping much better in the same room. His snoring is gone. He is starting to like it, too. He uses a nasal pillow. ResMed P 10 airfit. I like for him to try a bella swift with headgear option.      He was to great surprise diagnosed with a rather severe form of sleep apnea.  His sleep study was performed 08-06-2019. Slept for 57.6% of the recorded time.  The oxygen nadir was 78% the total time spent below 89% SPO2 equal 20 minutes.  There were several arousals most of them spontaneous but 43 of them associated with respiratory events.  There were 106 obstructive apneas 1 mixed apnea and 19 shallow breathing spells-hypopneas.  This amounts to an AHI per hour of sleep of 32.1.  In rem sleep the apnea index was accelerated to 52.1/h and the patient briefly resumed supine sleep he was noted to snore loudly.  He started  Using an auto CPAP, would ave liked to use a dental device , but his kind of apnea is not  effectively responding to dental devices.   Ricardo [name] has used his machine 27 out of 30 days, with an average use at time of 5 hours and 4 minutes.  He is using an air sense 10 AutoSet with a serial #[id number] the minimum pressure is 6 the maximum pressure of 16 cmH2O with 1 cm expiratory pressure relief.  The 95 percentile pressure is 8.5 cmH2O well within his current settings.  His residual AHI is 0.3/h which is an excellent resolution of his not mild baseline apnea.  No central apneas have emerged.   He used first a FFM but had aerophagia and now is on nasal pillows.  He hasn't been sleepy in daytime an that hasn't changed, normally gets a good 5 hours of sleep in bloc, and then another 1-2 hours.    Given the symptoms of dry mouth and ongoing burping, and dry airway- throat irritation, feeling parched.   we can attribute this to oral air flow.     Last visit_ Chief concern according to patient : " I am woken up in the middle of the night with palpitations, and my wife stated I snore more"  and " I also have a  brother with OSA diagnosis. I had a cardiac stent placed last year, followed by Ricardo [name]".   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  with a possible sleep disorder.  She has a  has a past medical history of Coronary heart disease, DDD (degenerative disc disease), cervical (11/19/2018), H/O heart artery stent, History of coronary artery disease (11/19/2018), History of seizures, Psoriasis (11/19/2018), and Psoriatic arthritis ([place])..  He had a seizure disorder in his teens and early twenties, had an overnight study in [place], that was in 1970. He took Carbamazepine for 20 years. He is not sure how to differentiate a  seizure form a nightmare, it felt similar. He was observed to have rhythmic head movements. He felt as if being touched in his sleep. Never had any event in daytime. He experienced sleep paralysis. He has  been followed by psoriatic arthritis.     Family medical /sleep history: one brother with OSA,.brother had CAD and Stent.     Social history: Patient is working as a Ricardo [name] Psychologist, counselling and lives in a household with his spouse. The couple has no children,.  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 Tobacco use: in college - now a party cigar smoker, none since stent .  ETOH use: drink 1 /month , Caffeine intake in form of Coffee( 2-3 /d) Soda( none ) Tea ( none ) ,no  energy drinks. Regular exercise in form of walking , was a regular at the gym.        Sleep habits are as follows: The patient's dinner time is between 8.30 PM.  The patient goes to bed at 10.30- 11  PM and continues to sleep for many hours, wakes rarely for bathroom breaks, he mostly wakes spontaneously due to palpitations( until 2 month ago- not sure what changed) the first time at 3-4 AM.   The preferred sleep position is on his left, with the support of 2 pillows. Dreams are reportedly frequent/vivid.  6.30  AM is the usual rise time. The patient wakes up spontaneously but has an alarm set, too.   He reports  feeling refreshed and  restored in AM, with symptoms such as dry mouth, dry throat, but no morning headaches and residual fatigue.  Naps are taken infrequently,very rarely.    Review of Systems: Out of a complete 14 system review, the patient complains of only the following symptoms, and all other reviewed systems are negative.:   fragmented sleep, autoimmune disease, history of " seizures"    How likely are you to doze in the following situations: 0 = not likely, 1 = slight chance, 2 = moderate chance, 3 = [name] chance   Sitting and Reading? Watching Television? Sitting inactive in a public place (theater or meeting)? As a passenger in a car for an hour without a break? Lying down in the afternoon when circumstances permit? Sitting and talking to  someone? Sitting quietly after lunch without alcohol? In a car, while stopped for a few minutes in traffic?   Total =3/ 24 points.   FSS endorsed at 17/ 63 points.   Social History   Socioeconomic History   Marital status: Married    Spouse name: Not on file   Number of children: Not on file   Years of education: Not on file   Highest education level: Not on file  Occupational History  Not on file  Tobacco Use   Smoking status: Former Smoker    Quit date: 1993    Years since quitting: 28.9   Smokeless tobacco: Never Used  Scientific laboratory technician Use: Never used  Substance and Sexual Activity   Alcohol use: Yes    Comment: occ   Drug use: Never   Sexual activity: Not on file  Other Topics Concern   Not on file  Social History Narrative   ** Merged History Encounter **       Social Determinants of Health   Financial Resource Strain:    Difficulty of Paying Living Expenses: Not on file  Food Insecurity:    Worried About Charity fundraiser in the Last Year: Not on file   [employer] of Food in the Last Year: Not on file  Transportation Needs:    [name] of Transportation (Medical): Not on file   [name] of Transportation (Non-Medical): Not on file  Physical Activity:    Days of Exercise per Week: Not on file   Minutes of Exercise per Session: Not on file  Stress:    Feeling of Stress : Not on file  Social Connections:    Frequency of Communication with Friends and Family: Not on file   Frequency of Social Gatherings with Friends and Family: Not on file   Attends Religious Services: Not on file   Active Member of Clubs or Organizations: Not on file   Attends Club or Organization Meetings: Not on file   Marital Status: Not on file    Family History  Problem Relation Age of Onset   Heart disease Father   Brother with OSA and CAD, stent   Past Medical History:  Diagnosis Date   Coronary heart disease    2 stent placed March 2019   DDD  (degenerative disc disease), cervical 11/19/2018   H/O heart artery stent    History of coronary artery disease 11/19/2018   Status post stent placement March 2019   History of seizures    No seizures in over 20 years.    Psoriasis 11/19/2018   clobetasol   Psoriatic arthritis ([place])    On Cosentyx since 2017    Past Surgical History:  Procedure Laterality Date   COLONOSCOPY  2007 & 2016   Normal   CYSTOSCOPY Right 2009   Removal of Ureteric Stone   CYSTOSCOPY  12/2019   HEMORRHOID SURGERY  2004   External hemorrhoid removed    HERNIA REPAIR     INGUINAL HERNIA REPAIR  1976   PILONIDAL CYST / SINUS EXCISION  2001   THORACOTOMY  1997   and biopsy   UPPER GI ENDOSCOPY  2007 & 2016   Normal     Current Outpatient Medications on File Prior to Visit  Medication Sig Dispense Refill   atorvastatin (LIPITOR) 40 MG tablet Take 1 tablet (40 mg total) by mouth daily. 90 tablet 3   clobetasol cream (TEMOVATE) 7.42 % Apply 1 application topically 2 (two) times daily. (Patient taking differently: Apply 1 application topically as needed. ) 45 g 1   clopidogrel (PLAVIX) 75 MG tablet Take 1 tablet (75 mg total) by mouth daily. Please keep upcoming appt in October with Ricardo [name] for future refills. Thank you 90 tablet 0   Coenzyme Q10 200 MG capsule Take 200 mg by mouth daily.      COSENTYX SENSOREADY, 300 MG, 150 MG/ML SOAJ Inject 300 mg into the skin every 28 (  twenty-eight) days. 6 mL 0   Cyanocobalamin (VITAMIN B12 PO) Take by mouth. Monday- Thursday     diclofenac Sodium (VOLTAREN) 1 % GEL Apply 2-4 grams to affected joint up to 4 times daily as needed 400 g 2   metoprolol succinate (TOPROL-XL) 25 MG 24 hr tablet Take 1/2 tablet (12.5 mg total) by mouth once a day 90 tablet 3   Multiple Vitamin (MULTIVITAMIN PO) Take by mouth daily.     nitroGLYCERIN (NITROSTAT) 0.4 MG SL tablet Place 1 tablet (0.4 mg total) under the tongue every 5 (five) minutes as needed for chest  pain. 25 tablet 3   pantoprazole (PROTONIX) 20 MG tablet Take 1 tablet (20 mg total) by mouth as needed for heartburn or indigestion. 30 tablet 2   No current facility-administered medications on file prior to visit.   Physical exam:  Today's Vitals   10/02/20 1545  BP: 121/75  Pulse: 65  Weight: 155 lb 8 oz (70.5 kg)  Height: 5\' 9"  (1.753 m)   Body mass index is 22.96 kg/m.   Wt Readings from Last 3 Encounters:  10/02/20 155 lb 8 oz (70.5 kg)  08/29/20 154 lb 9.6 oz (70.1 kg)  06/19/20 156 lb 12.8 oz (71.1 kg)     Ht Readings from Last 3 Encounters:  10/02/20 5\' 9"  (1.753 m)  08/29/20 5\' 9"  (1.753 m)  06/19/20 5\' 9"  (1.753 m)      General: The patient is awake, alert and appears not in acute distress. The patient is well groomed. Head: Normocephalic, atraumatic. Neck is supple. Mallampati  3,  neck circumference:14. 75  inches . Nasal airflow s slightly congested.  Retrognathia is  seen.  Dental status:  Cardiovascular:  Regular rate and cardiac rhythm by pulse,  without distended neck veins. Respiratory: Lungs are clear to auscultation.  Skin:  Without evidence of ankle edema, or rash. Trunk: The patient's posture is erect.   Neurologic exam : The patient is awake and alert, oriented to place and time.   Memory subjective described as intact.  Attention span & concentration ability appears normal.  Speech is fluent,  without  dysarthria, dysphonia or aphasia.  Mood and affect are appropriate.   Cranial nerves: no loss of smell or taste reported  Pupils are equal and briskly reactive to light. Funduscopic exam  deferred. .   Facial motor strength is symmetric and tongue and uvula move midline. Neck ROM : rotation, tilt and flexion extension were normal for age and shoulder shrug was symmetrical.   Given the symptoms of dry mouth and ongoing burping, and dry airway- throat irritation, feeling parched.   We can attribute this to oral air flow.     RV in 12 month-  I will order a new mask N 30 I , we fitted him today for this type.   After spending a total time of 29 minutes face to face and additional time for physical and neurologic examination, review of laboratory studies,  personal review of imaging studies, reports and results of other testing and review of referral information / records as far as provided in visit, I have established the following assessments: OSA on CPAP, therapy is effective but he has room for improvement on mask type.   I would like to thank Ricardo Baton, MD and  for allowing me to meet with and to take care of this pleasant patient.   In short, Ricardo [name] will follow up either personally or through our NP  within the next 12 month.   CC: I will share my notes with Ricardo. Virgina [name].   Electronically signed by: Ricardo Seat, MD 10/02/2020 4:09 PM  [facility] Board certified by The [employer] of Sleep Medicine and Diplomate of the [employer] of Sleep Medicine.  Board certified In Neurology through the [place], Fellow of the [employer] of Neurology. Medical Director of [facility].

## 2020-10-02 NOTE — Patient Instructions (Signed)
Sleep Apnea Sleep apnea is a condition in which breathing pauses or becomes shallow during sleep. Episodes of sleep apnea usually last 10 seconds or longer, and they may occur as many as 20 times an hour. Sleep apnea disrupts your sleep and keeps your body from getting the rest that it needs. This condition can increase your risk of certain health problems, including:  Heart attack.  Stroke.  Obesity.  Diabetes.  Heart failure.  Irregular heartbeat. What are the causes? There are three kinds of sleep apnea:  Obstructive sleep apnea. This kind is caused by a blocked or collapsed airway.  Central sleep apnea. This kind happens when the part of the brain that controls breathing does not send the correct signals to the muscles that control breathing.  Mixed sleep apnea. This is a combination of obstructive and central sleep apnea. The most common cause of this condition is a collapsed or blocked airway. An airway can collapse or become blocked if:  Your throat muscles are abnormally relaxed.  Your tongue and tonsils are larger than normal.  You are overweight.  Your airway is smaller than normal. What increases the risk? You are more likely to develop this condition if you:  Are overweight.  Smoke.  Have a smaller than normal airway.  Are elderly.  Are male.  Drink alcohol.  Take sedatives or tranquilizers.  Have a family history of sleep apnea. What are the signs or symptoms? Symptoms of this condition include:  Trouble staying asleep.  Daytime sleepiness and tiredness.  Irritability.  Loud snoring.  Morning headaches.  Trouble concentrating.  Forgetfulness.  Decreased interest in sex.  Unexplained sleepiness.  Mood swings.  Personality changes.  Feelings of depression.  Waking up often during the night to urinate.  Dry mouth.  Sore throat. How is this diagnosed? This condition may be diagnosed with:  A medical history.  A physical  exam.  A series of tests that are done while you are sleeping (sleep study). These tests are usually done in a sleep lab, but they may also be done at home. How is this treated? Treatment for this condition aims to restore normal breathing and to ease symptoms during sleep. It may involve managing health issues that can affect breathing, such as high blood pressure or obesity. Treatment may include:  Sleeping on your side.  Using a decongestant if you have nasal congestion.  Avoiding the use of depressants, including alcohol, sedatives, and narcotics.  Losing weight if you are overweight.  Making changes to your diet.  Quitting smoking.  Using a device to open your airway while you sleep, such as: ? An oral appliance. This is a custom-made mouthpiece that shifts your lower jaw forward. ? A continuous positive airway pressure (CPAP) device. This device blows air through a mask when you breathe out (exhale). ? A nasal expiratory positive airway pressure (EPAP) device. This device has valves that you put into each nostril. ? A bi-level positive airway pressure (BPAP) device. This device blows air through a mask when you breathe in (inhale) and breathe out (exhale).  Having surgery if other treatments do not work. During surgery, excess tissue is removed to create a wider airway. It is important to get treatment for sleep apnea. Without treatment, this condition can lead to:  High blood pressure.  Coronary artery disease.  In men, an inability to achieve or maintain an erection (impotence).  Reduced thinking abilities. Follow these instructions at home: Lifestyle  Make any lifestyle changes   that your health care provider recommends.  Eat a healthy, well-balanced diet.  Take steps to lose weight if you are overweight.  Avoid using depressants, including alcohol, sedatives, and narcotics.  Do not use any products that contain nicotine or tobacco, such as cigarettes,  e-cigarettes, and chewing tobacco. If you need help quitting, ask your health care provider. General instructions  Take over-the-counter and prescription medicines only as told by your health care provider.  If you were given a device to open your airway while you sleep, use it only as told by your health care provider.  If you are having surgery, make sure to tell your health care provider you have sleep apnea. You may need to bring your device with you.  Keep all follow-up visits as told by your health care provider. This is important. Contact a health care provider if:  The device that you received to open your airway during sleep is uncomfortable or does not seem to be working.  Your symptoms do not improve.  Your symptoms get worse. Get help right away if:  You develop: ? Chest pain. ? Shortness of breath. ? Discomfort in your back, arms, or stomach.  You have: ? Trouble speaking. ? Weakness on one side of your body. ? Drooping in your face. These symptoms may represent a serious problem that is an emergency. Do not wait to see if the symptoms will go away. Get medical help right away. Call your local emergency services (911 in the [place] Do not drive yourself to the hospital. Summary  Sleep apnea is a condition in which breathing pauses or becomes shallow during sleep.  The most common cause is a collapsed or blocked airway.  The goal of treatment is to restore normal breathing and to ease symptoms during sleep. This information is not intended to replace advice given to you by your health care provider. Make sure you discuss any questions you have with your health care provider. Document Revised: 04/06/2019 Document Reviewed: 06/15/2018 Elsevier Patient Education  [place]. CPAP and BPAP Information CPAP and BPAP are methods of helping a person breathe with the use of air pressure. CPAP stands for "continuous positive airway pressure." BPAP stands for  "bi-level positive airway pressure." In both methods, air is blown through your nose or mouth and into your air passages to help you breathe well. CPAP and BPAP use different amounts of pressure to blow air. With CPAP, the amount of pressure stays the same while you breathe in and out. With BPAP, the amount of pressure is increased when you breathe in (inhale) so that you can take larger breaths. Your health care provider will recommend whether CPAP or BPAP would be more helpful for you. Why are CPAP and BPAP treatments used? CPAP or BPAP can be helpful if you have:  Sleep apnea.  Chronic obstructive pulmonary disease (COPD).  Heart failure.  Medical conditions that weaken the muscles of the chest including muscular dystrophy, or neurological diseases such as amyotrophic lateral sclerosis (ALS).  Other problems that cause breathing to be weak, abnormal, or difficult. CPAP is most commonly used for obstructive sleep apnea (OSA) to keep the airways from collapsing when the muscles relax during sleep. How is CPAP or BPAP administered? Both CPAP and BPAP are provided by a small machine with a flexible plastic tube that attaches to a plastic mask. You wear the mask. Air is blown through the mask into your nose or mouth. The amount of pressure that is  used to blow the air can be adjusted on the machine. Your health care provider will determine the pressure setting that should be used based on your individual needs. When should CPAP or BPAP be used? In most cases, the mask only needs to be worn during sleep. Generally, the mask needs to be worn throughout the night and during any daytime naps. People with certain medical conditions may also need to wear the mask at other times when they are awake. Follow instructions from your health care provider about when to use the machine. What are some tips for using the mask?   Because the mask needs to be snug, some people feel trapped or closed-in  (claustrophobic) when first using the mask. If you feel this way, you may need to get used to the mask. One way to do this is by holding the mask loosely over your nose or mouth and then gradually applying the mask more snugly. You can also gradually increase the amount of time that you use the mask.  Masks are available in various types and sizes. Some fit over your mouth and nose while others fit over just your nose. If your mask does not fit well, talk with your health care provider about getting a different one.  If you are using a mask that fits over your nose and you tend to breathe through your mouth, a chin strap may be applied to help keep your mouth closed.  The CPAP and BPAP machines have alarms that may sound if the mask comes off or develops a leak.  If you have trouble with the mask, it is very important that you talk with your health care provider about finding a way to make the mask easier to tolerate. Do not stop using the mask. Stopping the use of the mask could have a negative impact on your health. What are some tips for using the machine?  Place your CPAP or BPAP machine on a secure table or stand near an electrical outlet.  Know where the on/off switch is located on the machine.  Follow instructions from your health care provider about how to set the pressure on your machine and when you should use it.  Do not eat or drink while the CPAP or BPAP machine is on. Food or fluids could get pushed into your lungs by the pressure of the CPAP or BPAP.  Do not smoke. Tobacco smoke residue can damage the machine.  For home use, CPAP and BPAP machines can be rented or purchased through home health care companies. Many different brands of machines are available. Renting a machine before purchasing may help you find out which particular machine works well for you.  Keep the CPAP or BPAP machine and attachments clean. Ask your health care provider for specific instructions. Get help  right away if:  You have redness or open areas around your nose or mouth where the mask fits.  You have trouble using the CPAP or BPAP machine.  You cannot tolerate wearing the CPAP or BPAP mask.  You have pain, discomfort, and bloating in your abdomen. Summary  CPAP and BPAP are methods of helping a person breathe with the use of air pressure.  Both CPAP and BPAP are provided by a small machine with a flexible plastic tube that attaches to a plastic mask.  If you have trouble with the mask, it is very important that you talk with your health care provider about finding a way to make  the mask easier to tolerate. This information is not intended to replace advice given to you by your health care provider. Make sure you discuss any questions you have with your health care provider. Document Revised: 02/09/2019 Document Reviewed: 09/08/2016 Elsevier Patient Education  [place].

## 2020-10-02 NOTE — Progress Notes (Signed)
Order for new cpap mask sent to [facility] via community message. Confirmation received that the order transmitted was successful.

## 2020-10-10 DIAGNOSIS — D1801 Hemangioma of skin and subcutaneous tissue: Secondary | ICD-10-CM | POA: Diagnosis not present

## 2020-10-10 DIAGNOSIS — L821 Other seborrheic keratosis: Secondary | ICD-10-CM | POA: Diagnosis not present

## 2020-10-10 DIAGNOSIS — L820 Inflamed seborrheic keratosis: Secondary | ICD-10-CM | POA: Diagnosis not present

## 2020-10-10 DIAGNOSIS — Z85828 Personal history of other malignant neoplasm of skin: Secondary | ICD-10-CM | POA: Diagnosis not present

## 2020-10-12 DIAGNOSIS — N390 Urinary tract infection, site not specified: Secondary | ICD-10-CM | POA: Diagnosis not present

## 2020-10-17 ENCOUNTER — Other Ambulatory Visit: Payer: Self-pay | Admitting: Rheumatology

## 2020-10-17 NOTE — Telephone Encounter (Signed)
Last Visit: 06/19/2020 Next Visit: 12/11/2020 Labs: 05/21/2020 CBC and CMP WNL  TB Gold: 05/21/2020 Neg   Current Dose per office note 06/19/2020: Cosentyx 300 mg every 28 days  DX: Psoriatic arthritis   Patient advised he is due to update labs. Patient will come Friday or Monday to update.   Okay to refill Cosentyx?

## 2020-10-18 ENCOUNTER — Other Ambulatory Visit: Payer: Self-pay | Admitting: Internal Medicine

## 2020-10-18 ENCOUNTER — Ambulatory Visit (HOSPITAL_BASED_OUTPATIENT_CLINIC_OR_DEPARTMENT_OTHER): Payer: 59 | Admitting: Pharmacist

## 2020-10-18 DIAGNOSIS — Z79899 Other long term (current) drug therapy: Secondary | ICD-10-CM

## 2020-10-18 MED ORDER — COSENTYX SENSOREADY (300 MG) 150 MG/ML ~~LOC~~ SOAJ: 300.0000 mg | SUBCUTANEOUS | 0 refills | Status: DC

## 2020-10-18 NOTE — Progress Notes (Signed)
   S: Patient presents today to the [facility] for review of their specialty medication.   Patient is currently taking Cosentyx for psoriatic arthritis. Patient is managed by Dr. Estanislado Pandy  Adherence: denies any missed doses  Efficacy: working well for him, no adverse effects  Monitoring/Current adverse effects: S/sx of infection: denies GI upset: denies S/sx of hypersensitivity: denies Pt to complete bloodwork tomorrow  O:     Lab Results  Component Value Date   WBC 5.9 05/21/2020   HGB 13.8 05/21/2020   HCT 40.3 05/21/2020   MCV 90.6 05/21/2020   PLT 249 05/21/2020      Chemistry      Component Value Date/Time   NA 140 05/21/2020 1350   NA 140 01/13/2020 1211   K 4.4 05/21/2020 1350   CL 104 05/21/2020 1350   CO2 29 05/21/2020 1350   BUN 11 05/21/2020 1350   BUN 15 01/13/2020 1211   CREATININE 0.91 05/21/2020 1350      Component Value Date/Time   CALCIUM 9.4 05/21/2020 1350   ALKPHOS 79 01/13/2020 1211   AST 18 05/21/2020 1350   ALT 14 05/21/2020 1350   BILITOT 0.6 05/21/2020 1350   BILITOT 0.2 01/13/2020 1211       A/P: 1. Medication review: Patient is on Cosentyx for psoriatic arthritis and is tolerating it well with no adverse effects. Reviewed the medication with him, including the following: Cosentyx an antibody used to treat inflammatory diseases, such as psoriasis and psoriatic arthritis. While on the medication, the patient is at an increased risk of infection. Patient should let rheumatologist know of any s/sx of infection. Possible adverse effects include hypersensitivity reactions, infection, and GI upset. Patient aware of appropriate injection technique.  No recommendation for any changes.  Benard Halsted, PharmD, [facility], [place] [phone]

## 2020-10-19 ENCOUNTER — Other Ambulatory Visit: Payer: Self-pay | Admitting: *Deleted

## 2020-10-19 DIAGNOSIS — Z79899 Other long term (current) drug therapy: Secondary | ICD-10-CM | POA: Diagnosis not present

## 2020-10-20 LAB — COMPLETE METABOLIC PANEL WITHOUT GFR
AG Ratio: 1.6 (calc) (ref 1.0–2.5)
ALT: 14 U/L (ref 9–46)
AST: 18 U/L (ref 10–35)
Albumin: 4.1 g/dL (ref 3.6–5.1)
Alkaline phosphatase (APISO): 63 U/L (ref 35–144)
BUN: 13 mg/dL (ref 7–25)
CO2: 30 mmol/L (ref 20–32)
Calcium: 9.2 mg/dL (ref 8.6–10.3)
Chloride: 105 mmol/L (ref 98–110)
Creat: 0.75 mg/dL (ref 0.70–1.25)
GFR, Est African American: 113 mL/min/1.73m2
GFR, Est Non African American: 98 mL/min/1.73m2
Globulin: 2.6 g/dL (ref 1.9–3.7)
Glucose, Bld: 88 mg/dL (ref 65–99)
Potassium: 4.3 mmol/L (ref 3.5–5.3)
Sodium: 140 mmol/L (ref 135–146)
Total Bilirubin: 0.4 mg/dL (ref 0.2–1.2)
Total Protein: 6.7 g/dL (ref 6.1–8.1)

## 2020-10-20 LAB — CBC WITH DIFFERENTIAL/PLATELET
Absolute Monocytes: 524 cells/uL (ref 200–950)
Basophils Absolute: 82 cells/uL (ref 0–200)
Basophils Relative: 1.2 %
Eosinophils Absolute: 381 cells/uL (ref 15–500)
Eosinophils Relative: 5.6 %
HCT: 37.2 % — ABNORMAL LOW (ref 38.5–50.0)
Hemoglobin: 12.8 g/dL — ABNORMAL LOW (ref 13.2–17.1)
Lymphs Abs: 1836 cells/uL (ref 850–3900)
MCH: 31.1 pg (ref 27.0–33.0)
MCHC: 34.4 g/dL (ref 32.0–36.0)
MCV: 90.5 fL (ref 80.0–100.0)
MPV: 10.8 fL (ref 7.5–12.5)
Monocytes Relative: 7.7 %
Neutro Abs: 3978 cells/uL (ref 1500–7800)
Neutrophils Relative %: 58.5 %
Platelets: 229 10*3/uL (ref 140–400)
RBC: 4.11 10*6/uL — ABNORMAL LOW (ref 4.20–5.80)
RDW: 11.9 % (ref 11.0–15.0)
Total Lymphocyte: 27 %
WBC: 6.8 10*3/uL (ref 3.8–10.8)

## 2020-10-21 NOTE — Progress Notes (Signed)
CMP is normal. Mild anemia noted.Advise MVI with iron.

## 2020-10-22 MED FILL — COSENTYX 300 MG DOSE-2 PENS: 150 | 28 days supply | Qty: 2 | Fill #0

## 2020-10-28 ENCOUNTER — Other Ambulatory Visit: Payer: Self-pay

## 2020-10-29 ENCOUNTER — Other Ambulatory Visit: Payer: Self-pay

## 2020-10-29 ENCOUNTER — Other Ambulatory Visit: Payer: Self-pay | Admitting: Interventional Cardiology

## 2020-10-29 MED ORDER — CLOPIDOGREL BISULFATE 75 MG PO TABS: 75.0000 mg | ORAL_TABLET | Freq: Every day | ORAL | 3 refills | Status: DC

## 2020-10-29 MED FILL — CLOPIDOGREL 75 MG TABLET: 75 | 90 days supply | Qty: 90 | Fill #0

## 2020-11-14 ENCOUNTER — Other Ambulatory Visit (HOSPITAL_COMMUNITY): Payer: Self-pay | Admitting: Urology

## 2020-11-14 DIAGNOSIS — N401 Enlarged prostate with lower urinary tract symptoms: Secondary | ICD-10-CM | POA: Diagnosis not present

## 2020-11-14 DIAGNOSIS — R309 Painful micturition, unspecified: Secondary | ICD-10-CM | POA: Diagnosis not present

## 2020-11-14 DIAGNOSIS — Z87442 Personal history of urinary calculi: Secondary | ICD-10-CM | POA: Diagnosis not present

## 2020-11-14 DIAGNOSIS — R3914 Feeling of incomplete bladder emptying: Secondary | ICD-10-CM | POA: Diagnosis not present

## 2020-11-14 MED FILL — TAMSULOSIN HCL 0.4 MG CAP: 0.4 | 30 days supply | Qty: 30 | Fill #0

## 2020-11-26 ENCOUNTER — Other Ambulatory Visit: Payer: Self-pay | Admitting: Rheumatology

## 2020-11-26 ENCOUNTER — Other Ambulatory Visit: Payer: Self-pay | Admitting: Pharmacist

## 2020-11-26 MED ORDER — COSENTYX SENSOREADY (300 MG) 150 MG/ML ~~LOC~~ SOAJ: 300.0000 mg | SUBCUTANEOUS | 2 refills | Status: DC

## 2020-11-26 NOTE — Telephone Encounter (Signed)
Last Visit:06/19/2020 Next Visit: 12/11/2020 Labs: 10/19/2020, CMP is normal. Mild anemia noted.Advise MVI with iron. TB Gold: 05/21/2020, negative  Current Dose per office note 06/19/2020, Cosentyx 300 mg every 28 days TK:[id number] arthritis   Okay to refill Cosentyx ?

## 2020-11-27 NOTE — Progress Notes (Deleted)
Office Visit Note  Patient: Ricardo [name]             Date of Birth: Dec 18, 1955           MRN: [id number]             PCP: Shon Baton, MD Referring: Shon Baton, MD Visit Date: 12/11/2020 Occupation: @GUAROCC @  Subjective:  No chief complaint on file.   History of Present Illness: Ricardo [name] is a 65 y.o. male ***   Activities of Daily Living:  Patient reports morning stiffness for *** {minute/hour:19697}.   Patient {ACTIONS;DENIES/REPORTS:21021675::"Denies"} nocturnal pain.  Difficulty dressing/grooming: {ACTIONS;DENIES/REPORTS:21021675::"Denies"} Difficulty climbing stairs: {ACTIONS;DENIES/REPORTS:21021675::"Denies"} Difficulty getting out of chair: {ACTIONS;DENIES/REPORTS:21021675::"Denies"} Difficulty using hands for taps, buttons, cutlery, and/or writing: {ACTIONS;DENIES/REPORTS:21021675::"Denies"}  No Rheumatology ROS completed.   PMFS History:  Patient Active Problem List   Diagnosis Date Noted  . 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 History of seizures as a child 07/21/2019  . Snoring 07/21/2019  . History of coronary artery disease 11/19/2018  . Current use of anticoagulant therapy 11/19/2018  . Psoriasis 11/19/2018  . Psoriatic arthritis ([place]) 11/19/2018  . High risk medication use 11/19/2018  . DDD (degenerative disc disease), cervical 11/19/2018  . Encounter for medication review 05/13/2018    Past Medical History:  Diagnosis Date  . Coronary heart disease    2 stent placed March 2019  . DDD (degenerative disc disease), cervical 11/19/2018  . H/O heart artery stent   . History of coronary artery disease 11/19/2018   Status post stent placement March 2019  . History of seizures    No seizures in over 20 years.   . Psoriasis 11/19/2018   clobetasol  . Psoriatic arthritis ([place])    On Cosentyx since 2017    Family History  Problem Relation Age of Onset  . Heart disease Father    Past Surgical History:  Procedure Laterality Date  .  COLONOSCOPY  2007 & 2016   Normal  . CYSTOSCOPY Right 2009   Removal of Ureteric Stone  . CYSTOSCOPY  12/2019  . HEMORRHOID SURGERY  2004   External hemorrhoid removed   . HERNIA REPAIR    . INGUINAL HERNIA REPAIR  1976  . PILONIDAL CYST / SINUS EXCISION  2001  . THORACOTOMY  1997   and biopsy  . UPPER GI ENDOSCOPY  2007 & 2016   Normal   Social History   Social History Narrative   ** Merged History Encounter **       Immunization History  Administered Date(s) Administered  . PFIZER(Purple Top)SARS-COV-2 Vaccination 10/23/2019, 11/14/2019, 07/13/2020     Objective: Vital Signs: There were no vitals taken for this visit.   Physical Exam   Musculoskeletal Exam: ***  CDAI Exam: CDAI Score: -- Patient Global: --; Provider Global: -- Swollen: --; Tender: -- Joint Exam 12/11/2020   No joint exam has been documented for this visit   There is currently no information documented on the homunculus. Go to the Rheumatology activity and complete the homunculus joint exam.  Investigation: No additional findings.  Imaging: No results found.  Recent Labs: Lab Results  Component Value Date   WBC 6.8 10/19/2020   HGB 12.8 (L) 10/19/2020   PLT 229 10/19/2020   NA 140 10/19/2020   K 4.3 10/19/2020   CL 105 10/19/2020   CO2 30 10/19/2020   GLUCOSE 88 10/19/2020   BUN 13 10/19/2020   CREATININE 0.75 10/19/2020   BILITOT 0.4 10/19/2020  ALKPHOS 79 01/13/2020   AST 18 10/19/2020   ALT 14 10/19/2020   PROT 6.7 10/19/2020   ALBUMIN 4.4 01/13/2020   CALCIUM 9.2 10/19/2020   GFRAA 113 10/19/2020   QFTBGOLDPLUS NEGATIVE 05/21/2020    Speciality Comments: No specialty comments available.  Procedures:  No procedures performed Allergies: Patient has no known allergies.   Assessment / Plan:     Visit Diagnoses: No diagnosis found.  Orders: No orders of the defined types were placed in this encounter.  No orders of the defined types were placed in this  encounter.   Face-to-face time spent with patient was *** minutes. Greater than 50% of time was spent in counseling and coordination of care.  Follow-Up Instructions: No follow-ups on file.   Earnestine Mealing, CMA  Note - This record has been created using Editor, commissioning.  Chart creation errors have been sought, but may not always  have been located. Such creation errors do not reflect on  the standard of medical care.

## 2020-12-03 MED FILL — COSENTYX 300 MG DOSE-2 PENS: 150 | 28 days supply | Qty: 2 | Fill #0

## 2020-12-03 MED FILL — METOPROLOL SUCCINATE ER 25: 25 | 90 days supply | Qty: 45 | Fill #2

## 2020-12-04 ENCOUNTER — Other Ambulatory Visit: Payer: Self-pay | Admitting: *Deleted

## 2020-12-04 ENCOUNTER — Other Ambulatory Visit: Payer: Self-pay | Admitting: Interventional Cardiology

## 2020-12-04 MED ORDER — METOPROLOL SUCCINATE ER 25 MG PO TB24: ORAL_TABLET | ORAL | 3 refills | Status: DC

## 2020-12-05 NOTE — Progress Notes (Signed)
Office Visit Note  Patient: Ricardo [name]             Date of Birth: 1955/12/19           MRN: [id number]             PCP: Shon Baton, MD Referring: Shon Baton, MD Visit Date: 12/19/2020 Occupation: @GUAROCC @  Subjective:  Other (Right hand 5th digit pain/swelling )   History of Present Illness: Ricardo [name] is a 65 y.o. male with history of psoriatic arthritis, psoriasis and osteoarthritis.  He states that he was not taking Cosentyx on a regular basis until the last visit.  But he has been taking it on a regular basis now.  He had to skip her to Cosentyx when he was taking his booster dosing.  He states he has been noticing pain and swelling in his right fifth PIP joint.  He also cannot extend his right fifth PIP joint completely now.  None of the other joints are painful or swollen.  His psoriasis is well controlled.  Activities of Daily Living:  Patient reports morning stiffness for 0 minutes.   Patient Denies nocturnal pain.  Difficulty dressing/grooming: Denies Difficulty climbing stairs: Denies Difficulty getting out of chair: Denies Difficulty using hands for taps, buttons, cutlery, and/or writing: Denies  Review of Systems  Constitutional: Negative for fatigue.  HENT: Negative for mouth sores, mouth dryness and nose dryness.   Eyes: Positive for redness. Negative for pain, itching and dryness.  Respiratory: Negative for shortness of breath and difficulty breathing.   Cardiovascular: Negative for chest pain and palpitations.  Gastrointestinal: Negative for blood in stool, constipation and diarrhea.  Endocrine: Negative for increased urination.  Genitourinary: Negative for difficulty urinating.  Musculoskeletal: Positive for arthralgias, joint pain and joint swelling. Negative for myalgias, morning stiffness, muscle tenderness and myalgias.  Skin: Negative for color change, rash and redness.  Allergic/Immunologic: Negative for susceptible to infections.  Neurological:  Negative for dizziness, numbness, headaches, memory loss and weakness.  Hematological: Negative for bruising/bleeding tendency.  Psychiatric/Behavioral: Negative for confusion and sleep disturbance.    PMFS History:  Patient Active Problem List   Diagnosis Date Noted  . 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 History of seizures as a child 07/21/2019  . Snoring 07/21/2019  . History of coronary artery disease 11/19/2018  . Current use of anticoagulant therapy 11/19/2018  . Psoriasis 11/19/2018  . Psoriatic arthritis ([place]) 11/19/2018  . High risk medication use 11/19/2018  . DDD (degenerative disc disease), cervical 11/19/2018  . Encounter for medication review 05/13/2018    Past Medical History:  Diagnosis Date  . Coronary heart disease    2 stent placed March 2019  . DDD (degenerative disc disease), cervical 11/19/2018  . H/O heart artery stent   . History of coronary artery disease 11/19/2018   Status post stent placement March 2019  . History of seizures    No seizures in over 20 years.   . Psoriasis 11/19/2018   clobetasol  . Psoriatic arthritis ([place])    On Cosentyx since 2017    Family History  Problem Relation Age of Onset  . Heart disease Father    Past Surgical History:  Procedure Laterality Date  . COLONOSCOPY  2007 & 2016   Normal  . CYSTOSCOPY Right 2009   Removal of Ureteric Stone  . CYSTOSCOPY  12/2019  . HEMORRHOID SURGERY  2004   External hemorrhoid removed   . HERNIA REPAIR    .  INGUINAL HERNIA REPAIR  1976  . PILONIDAL CYST / SINUS EXCISION  2001  . THORACOTOMY  1997   and biopsy  . UPPER GI ENDOSCOPY  2007 & 2016   Normal   Social History   Social History Narrative   ** Merged History Encounter **       Immunization History  Administered Date(s) Administered  . PFIZER(Purple Top)SARS-COV-2 Vaccination 10/23/2019, 11/14/2019, 07/13/2020     Objective: Vital Signs: BP 122/75 (BP Location: Left Arm, Patient Position: Sitting,  Cuff Size: Normal)   Pulse 61   Resp 16   Ht 5\' 9"  (1.753 m)   Wt 160 lb 12.8 oz (72.9 kg)   BMI 23.75 kg/m    Physical Exam Vitals and nursing note reviewed.  Constitutional:      Appearance: He is well-developed and well-nourished.  HENT:     Head: Normocephalic and atraumatic.  Eyes:     Extraocular Movements: EOM normal.     Pupils: Pupils are equal, round, and reactive to light.     Comments: Left conjunctival injection was noted  Cardiovascular:     Rate and Rhythm: Normal rate and regular rhythm.     Heart sounds: Normal heart sounds.  Pulmonary:     Effort: Pulmonary effort is normal.     Breath sounds: Normal breath sounds.  Abdominal:     General: Bowel sounds are normal.     Palpations: Abdomen is soft.  Musculoskeletal:     Cervical back: Normal range of motion and neck supple.  Skin:    General: Skin is warm and dry.     Capillary Refill: Capillary refill takes less than 2 seconds.  Neurological:     Mental Status: He is alert and oriented to person, place, and time.  Psychiatric:        Mood and Affect: Mood and affect normal.        Behavior: Behavior normal.      Musculoskeletal Exam: C-spine was in good range of motion.  Some thoracic kyphosis was noted.  Shoulder joints, elbow joints, wrist joints with good range of motion.  Some PIP and DIP thickening was noted.  Her right fifth PIP limited extension and synovitis was noted.  Hip joints and knee joints in good range of motion with no synovitis.  There was no tenderness over MTPs and PIPs.  CDAI Exam: CDAI Score: - Patient Global: -; Provider Global: - Swollen: -; Tender: - Joint Exam 12/19/2020   No joint exam has been documented for this visit   There is currently no information documented on the homunculus. Go to the Rheumatology activity and complete the homunculus joint exam.  Investigation: No additional findings.  Imaging: No results found.  Recent Labs: Lab Results  Component Value  Date   WBC 6.8 10/19/2020   HGB 12.8 (L) 10/19/2020   PLT 229 10/19/2020   NA 140 10/19/2020   K 4.3 10/19/2020   CL 105 10/19/2020   CO2 30 10/19/2020   GLUCOSE 88 10/19/2020   BUN 13 10/19/2020   CREATININE 0.75 10/19/2020   BILITOT 0.4 10/19/2020   ALKPHOS 79 01/13/2020   AST 18 10/19/2020   ALT 14 10/19/2020   PROT 6.7 10/19/2020   ALBUMIN 4.4 01/13/2020   CALCIUM 9.2 10/19/2020   GFRAA 113 10/19/2020   QFTBGOLDPLUS NEGATIVE 05/21/2020    Speciality Comments: No specialty comments available.    Procedures:  No procedures performed Allergies: Patient has no known allergies.   Assessment /  Plan:     Visit Diagnoses: Psoriatic arthritis ([place]) - diagnosed in 1999, treated by Dr. Kerry Hough in [place] -he had inadequate response to methotrexate, Enbrel, Humira.  He has erosive disease.  He had x-rays in 2020.  He was not taking Cosentyx on a regular basis.  He he states that he has been taking Cosentyx on a regular basis since the last visit.  He interrupted treatment with Cosentyx when he had booster vaccines.  He has been having increased pain and swelling in his right fifth PIP joint.  He is also having difficulty extending it completely.  He had synovitis on my examination today.  We discussed injecting the right fifth PIP joint.  He would like to have the injection done on a Friday so he can rest over the weekend.  We will schedule it for February 25.  I also plan to repeat x-rays at the next visit of his bilateral hands and bilateral feet.  Psoriasis-he denies any active lesions.  High risk medication use - Cosentyx 300 mg every 28 days.  - Plan: SARS-CoV-2 Antibody(IgG)Spike,Semi-Quantitative, CBC with Differential/Platelet, COMPLETE METABOLIC PANEL WITH GFR  Swelling of hand joint, right -plan ultrasound guided injection on February 25.  Redness of eye, left-he has been noticing redness in his left eye for the last 2 days.  I will schedule appointment with the ophthalmologist  to rule out uveitis.  Arthritis of carpometacarpal (CMC) joint of right thumb-he has mild discomfort.  Current use of anticoagulant therapy  History of coronary artery disease - Status post stent placement March 2019.  He is on plant-based diet now.  Obstructive sleep apnea syndrome - on CPAP  Gastroesophageal reflux disease with esophagitis without hemorrhage-his GI symptoms improved.  Educated about COVID-19 virus infection-he has been fully vaccinated and also had a third dose.  I recommended a fourth dose (booster) 6 months after the third dose.  Use of mask, social distancing and hand hygiene was discussed.  We will also check Covid antibody titers today.  Orders: Orders Placed This Encounter  Procedures  . SARS-CoV-2 Antibody(IgG)Spike,Semi-Quantitative  . CBC with Differential/Platelet  . COMPLETE METABOLIC PANEL WITH GFR  . Ambulatory referral to Ophthalmology   No orders of the defined types were placed in this encounter.    Follow-Up Instructions: Return in about 5 months (around 05/18/2021) for Psoriatic arthritis.   Bo Merino, MD  Note - This record has been created using Editor, commissioning.  Chart creation errors have been sought, but may not always  have been located. Such creation errors do not reflect on  the standard of medical care.

## 2020-12-10 ENCOUNTER — Other Ambulatory Visit: Payer: Self-pay | Admitting: Interventional Cardiology

## 2020-12-10 MED ORDER — ATORVASTATIN CALCIUM 40 MG PO TABS: 40.0000 mg | ORAL_TABLET | Freq: Every day | ORAL | 2 refills | Status: DC

## 2020-12-10 MED FILL — ATORVASTATIN 40 MG TABLET: 40 | 90 days supply | Qty: 90 | Fill #1

## 2020-12-11 ENCOUNTER — Ambulatory Visit: Payer: 59 | Admitting: Rheumatology

## 2020-12-11 DIAGNOSIS — Z8679 Personal history of other diseases of the circulatory system: Secondary | ICD-10-CM

## 2020-12-11 DIAGNOSIS — M1811 Unilateral primary osteoarthritis of first carpometacarpal joint, right hand: Secondary | ICD-10-CM

## 2020-12-11 DIAGNOSIS — Z79899 Other long term (current) drug therapy: Secondary | ICD-10-CM

## 2020-12-11 DIAGNOSIS — G4733 Obstructive sleep apnea (adult) (pediatric): Secondary | ICD-10-CM

## 2020-12-11 DIAGNOSIS — L4050 Arthropathic psoriasis, unspecified: Secondary | ICD-10-CM

## 2020-12-11 DIAGNOSIS — K2100 Gastro-esophageal reflux disease with esophagitis, without bleeding: Secondary | ICD-10-CM

## 2020-12-11 DIAGNOSIS — L409 Psoriasis, unspecified: Secondary | ICD-10-CM

## 2020-12-11 DIAGNOSIS — Z7901 Long term (current) use of anticoagulants: Secondary | ICD-10-CM

## 2020-12-19 ENCOUNTER — Ambulatory Visit (INDEPENDENT_AMBULATORY_CARE_PROVIDER_SITE_OTHER): Payer: 59 | Admitting: Rheumatology

## 2020-12-19 ENCOUNTER — Encounter: Payer: Self-pay | Admitting: Rheumatology

## 2020-12-19 ENCOUNTER — Other Ambulatory Visit: Payer: Self-pay

## 2020-12-19 ENCOUNTER — Ambulatory Visit: Payer: Self-pay

## 2020-12-19 VITALS — BP 122/75 | HR 61 | Resp 16 | Ht 69.0 in | Wt 160.8 lb

## 2020-12-19 DIAGNOSIS — Z7901 Long term (current) use of anticoagulants: Secondary | ICD-10-CM

## 2020-12-19 DIAGNOSIS — M1811 Unilateral primary osteoarthritis of first carpometacarpal joint, right hand: Secondary | ICD-10-CM

## 2020-12-19 DIAGNOSIS — G4733 Obstructive sleep apnea (adult) (pediatric): Secondary | ICD-10-CM

## 2020-12-19 DIAGNOSIS — H5789 Other specified disorders of eye and adnexa: Secondary | ICD-10-CM

## 2020-12-19 DIAGNOSIS — L4050 Arthropathic psoriasis, unspecified: Secondary | ICD-10-CM

## 2020-12-19 DIAGNOSIS — L409 Psoriasis, unspecified: Secondary | ICD-10-CM | POA: Diagnosis not present

## 2020-12-19 DIAGNOSIS — Z7189 Other specified counseling: Secondary | ICD-10-CM

## 2020-12-19 DIAGNOSIS — Z8679 Personal history of other diseases of the circulatory system: Secondary | ICD-10-CM | POA: Diagnosis not present

## 2020-12-19 DIAGNOSIS — M25441 Effusion, right hand: Secondary | ICD-10-CM

## 2020-12-19 DIAGNOSIS — Z79899 Other long term (current) drug therapy: Secondary | ICD-10-CM | POA: Diagnosis not present

## 2020-12-19 DIAGNOSIS — K2100 Gastro-esophageal reflux disease with esophagitis, without bleeding: Secondary | ICD-10-CM

## 2020-12-19 NOTE — Patient Instructions (Signed)
Standing Labs We placed an order today for your standing lab work.   Please have your standing labs drawn in March and every 3 months  If possible, please have your labs drawn 2 weeks prior to your appointment so that the provider can discuss your results at your appointment.  We have open lab daily Monday through Thursday from 1:30-4:30 PM and Friday from 1:30-4:00 PM at the office of Dr. Bo Merino, [facility].   Please be advised, all patients with office appointments requiring lab work will take precedents over walk-in lab work.  If possible, please come for your lab work on Monday and Friday afternoons, as you may experience shorter wait times. The office is located at [address], [place], [place], [place] [address] No appointment is necessary.   Labs are drawn by [facility]. Please bring your co-pay at the time of your lab draw.  You may receive a bill from [facility] for your lab work.  If you wish to have your labs drawn at another location, please call the office 24 hours in advance to send orders.  If you have any questions regarding directions or hours of operation,  please call [phone].   As a reminder, please drink plenty of water prior to coming for your lab work. Thanks!   COVID-19 vaccine recommendations:    COVID-19 vaccine is recommended for everyone (unless you are allergic to a vaccine component), even if you are on a medication that suppresses your immune system.    Do not take Tylenol or any anti-inflammatory medications (NSAIDs) 24 hours prior to the COVID-19 vaccination.   There is no direct evidence about the efficacy of the COVID-19 vaccine in individuals who are on medications that suppress the immune system.   Even if you are fully vaccinated, and you are on any medications that suppress your immune system, please continue to wear a mask, maintain at least six feet social distance and practice hand hygiene.   If you develop a COVID-19  infection, please contact your PCP or our office to determine if you need monoclonal antibody infusion.  The booster vaccine is now available for immunocompromised patients.   Please see the following web sites for updated information.   [email or web address]/[email or web address]-[email or web address]-Patient-Resources.[email or web address]

## 2020-12-20 LAB — SARS-COV-2 ANTIBODY(IGG)SPIKE,SEMI-QUANTITATIVE: SARS COV1 AB(IGG)SPIKE,SEMI QN: 51 index — ABNORMAL HIGH (ref <1.00)

## 2020-12-20 NOTE — Progress Notes (Signed)
Antibody titers are positive for COVID-19.  He may consider another booster as discussed yesterday.

## 2020-12-25 ENCOUNTER — Encounter: Payer: Self-pay | Admitting: Rheumatology

## 2020-12-25 NOTE — Telephone Encounter (Signed)
Please notify [name] that we will cancel his appointment. He may consider getting x-ray of his right hand. He can either come in the office or he can do it at the hospital. We can send the order for the x-ray of the right hand.

## 2020-12-28 ENCOUNTER — Encounter: Payer: Self-pay | Admitting: Rheumatology

## 2020-12-28 ENCOUNTER — Other Ambulatory Visit: Payer: 59 | Admitting: Rheumatology

## 2020-12-28 MED FILL — COSENTYX 300 MG DOSE-2 PENS: 150 | 28 days supply | Qty: 2 | Fill #1

## 2021-01-09 DIAGNOSIS — R350 Frequency of micturition: Secondary | ICD-10-CM | POA: Diagnosis not present

## 2021-01-09 DIAGNOSIS — R309 Painful micturition, unspecified: Secondary | ICD-10-CM | POA: Diagnosis not present

## 2021-01-09 DIAGNOSIS — N401 Enlarged prostate with lower urinary tract symptoms: Secondary | ICD-10-CM | POA: Diagnosis not present

## 2021-01-16 ENCOUNTER — Other Ambulatory Visit (HOSPITAL_COMMUNITY): Payer: Self-pay | Admitting: Family Medicine

## 2021-01-16 DIAGNOSIS — Z719 Counseling, unspecified: Secondary | ICD-10-CM | POA: Diagnosis not present

## 2021-01-16 DIAGNOSIS — Z23 Encounter for immunization: Secondary | ICD-10-CM | POA: Diagnosis not present

## 2021-01-18 DIAGNOSIS — G4733 Obstructive sleep apnea (adult) (pediatric): Secondary | ICD-10-CM | POA: Diagnosis not present

## 2021-01-21 ENCOUNTER — Other Ambulatory Visit (HOSPITAL_COMMUNITY): Payer: Self-pay | Admitting: Family Medicine

## 2021-01-22 MED FILL — COSENTYX 300 MG DOSE-2 PENS: 150 | 28 days supply | Qty: 2 | Fill #2

## 2021-01-22 MED FILL — ATOVAQUONE-PROGUANIL 250-10: 250-100 | 28 days supply | Qty: 28 | Fill #0

## 2021-01-23 MED FILL — AZITHROMYCIN 500 MG TABS: 500 | 3 days supply | Qty: 4 | Fill #0

## 2021-01-30 ENCOUNTER — Ambulatory Visit: Payer: Self-pay

## 2021-01-30 ENCOUNTER — Ambulatory Visit (INDEPENDENT_AMBULATORY_CARE_PROVIDER_SITE_OTHER): Payer: 59

## 2021-01-30 ENCOUNTER — Other Ambulatory Visit: Payer: Self-pay | Admitting: *Deleted

## 2021-01-30 ENCOUNTER — Other Ambulatory Visit: Payer: Self-pay

## 2021-01-30 DIAGNOSIS — Z79899 Other long term (current) drug therapy: Secondary | ICD-10-CM

## 2021-01-30 DIAGNOSIS — L409 Psoriasis, unspecified: Secondary | ICD-10-CM

## 2021-01-30 DIAGNOSIS — M25441 Effusion, right hand: Secondary | ICD-10-CM

## 2021-01-30 DIAGNOSIS — M79671 Pain in right foot: Secondary | ICD-10-CM | POA: Diagnosis not present

## 2021-01-30 DIAGNOSIS — M79672 Pain in left foot: Secondary | ICD-10-CM | POA: Diagnosis not present

## 2021-01-30 DIAGNOSIS — L4050 Arthropathic psoriasis, unspecified: Secondary | ICD-10-CM

## 2021-01-30 DIAGNOSIS — M79641 Pain in right hand: Secondary | ICD-10-CM | POA: Diagnosis not present

## 2021-01-30 DIAGNOSIS — K2100 Gastro-esophageal reflux disease with esophagitis, without bleeding: Secondary | ICD-10-CM

## 2021-01-30 DIAGNOSIS — M79642 Pain in left hand: Secondary | ICD-10-CM | POA: Diagnosis not present

## 2021-01-30 NOTE — Addendum Note (Signed)
Addended by: Shona Needles on: 01/30/2021 05:10 PM   Modules accepted: Orders

## 2021-01-30 NOTE — Addendum Note (Signed)
Addended by: Ofilia Neas on: 01/30/2021 05:03 PM   Modules accepted: Orders

## 2021-01-31 ENCOUNTER — Other Ambulatory Visit (HOSPITAL_COMMUNITY): Payer: Self-pay

## 2021-01-31 IMAGING — CT CT ABD-PEL WO/W CM
3 of 6 series · 12 of 32 positions shown, 17 images · IV contrast (APPLIED)
Comparison: None.

CLINICAL DATA: Gross painless hematuria on 09/14/2019. History of
renal calculi.

EXAM:
CT ABDOMEN AND PELVIS WITHOUT AND WITH CONTRAST
TECHNIQUE: Multidetector CT imaging of the abdomen and pelvis was performed
following the standard protocol before and following the bolus
administration of intravenous contrast.
CONTRAST:  100mL 6FGSSR-NSS IOPAMIDOL (6FGSSR-NSS) INJECTION 61%

[Series 2: abd/pelvis w/(date) · axial · 0.73mm/px · z∈[-331,-76]mm · 4 of 86 slices shown, 9 images]
[im 18/86  soft-tissue]
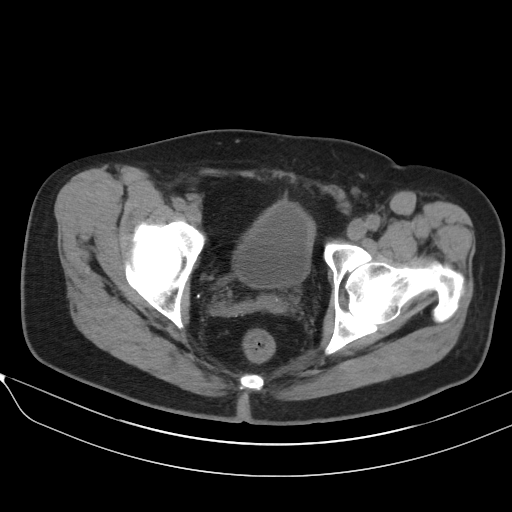
[im 18/86  lung]
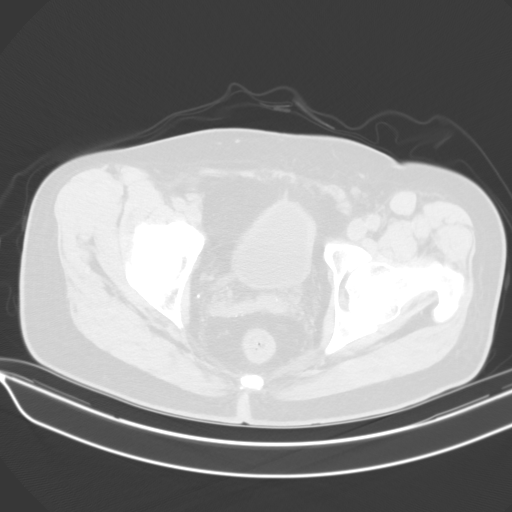
[im 18/86  bone]
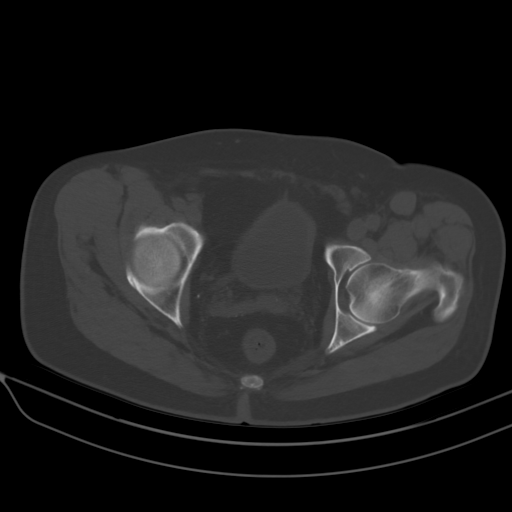
[im 35/86  soft-tissue]
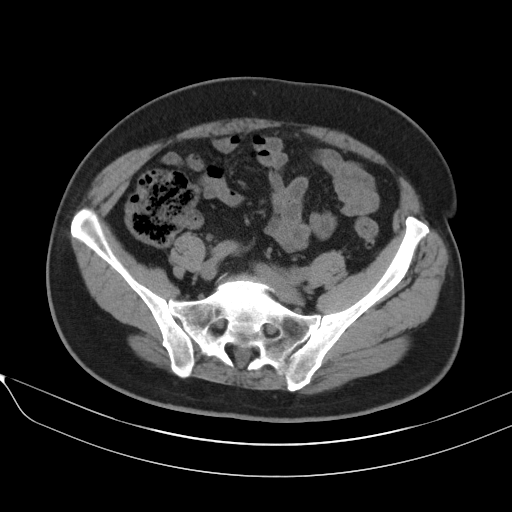
[im 35/86  lung]
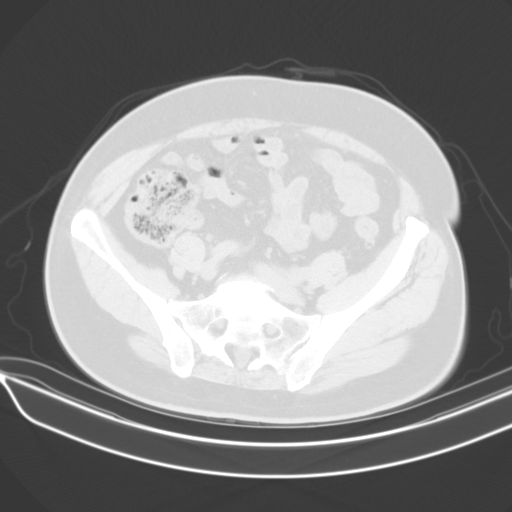
[im 52/86  soft-tissue]
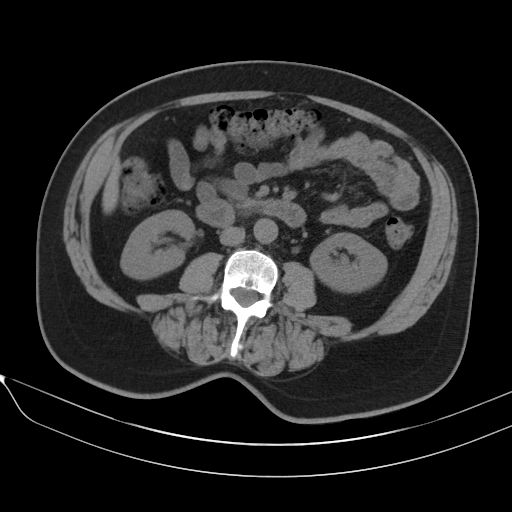
[im 52/86  lung]
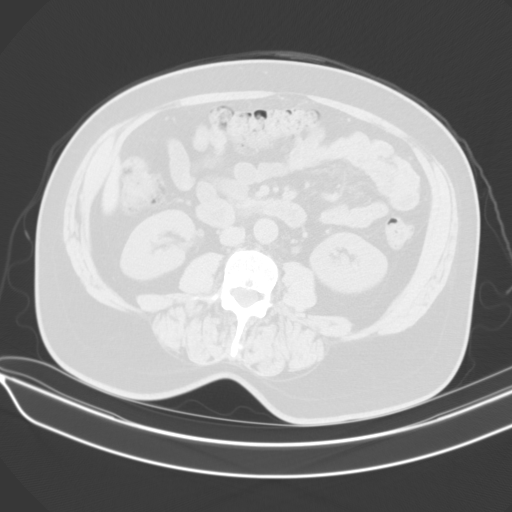
[im 69/86  soft-tissue]
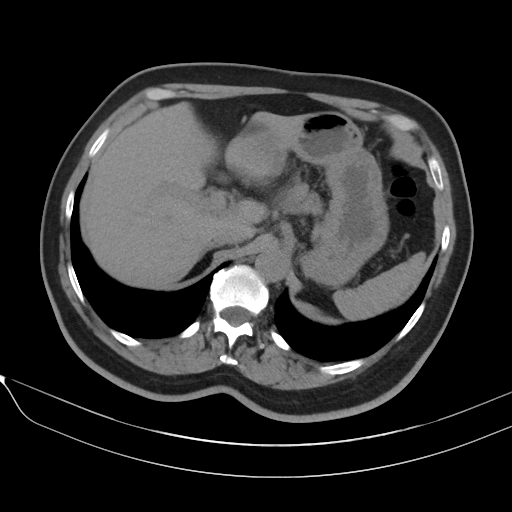
[im 69/86  lung]
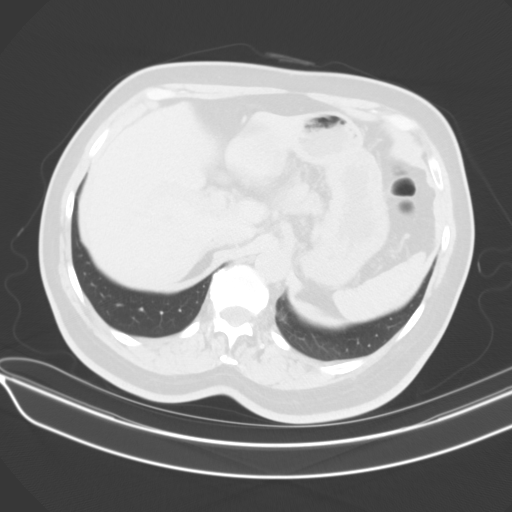

[Series 4: abd/pelvis w cont · axial · 0.73mm/px · z∈[-331,-76]mm · 4 of 86 slices shown]
[im 18/86  soft-tissue]
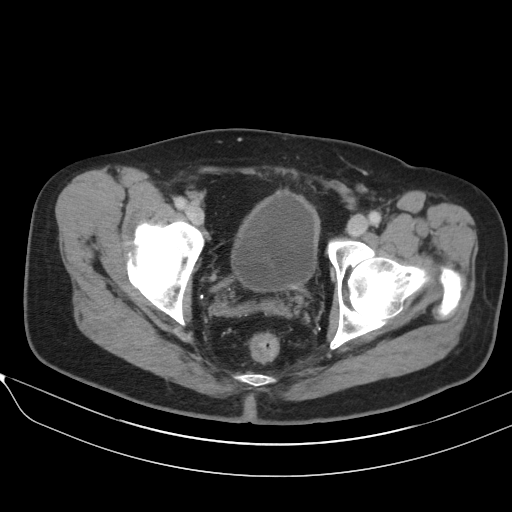
[im 35/86  soft-tissue]
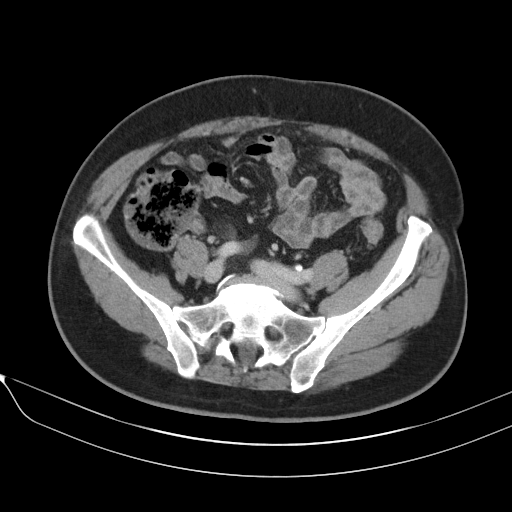
[im 52/86  soft-tissue]
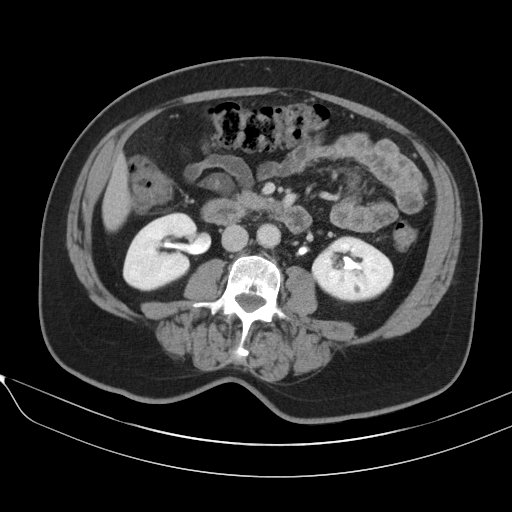
[im 69/86  soft-tissue]
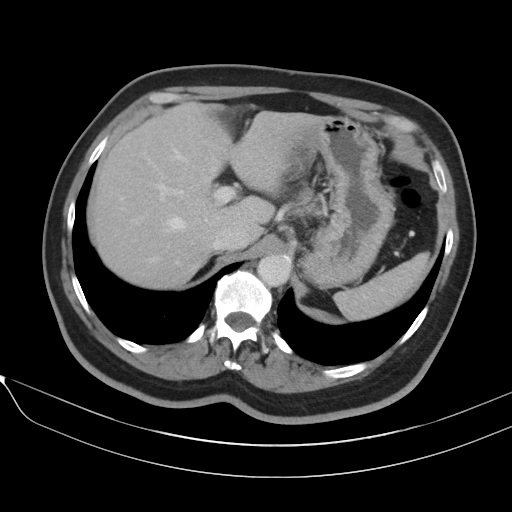

[Series 11: abd/pelvis w/cont · axial · 0.73mm/px · z∈[-279,-14]mm · 4 of 89 slices shown]
[im 18/89  soft-tissue]
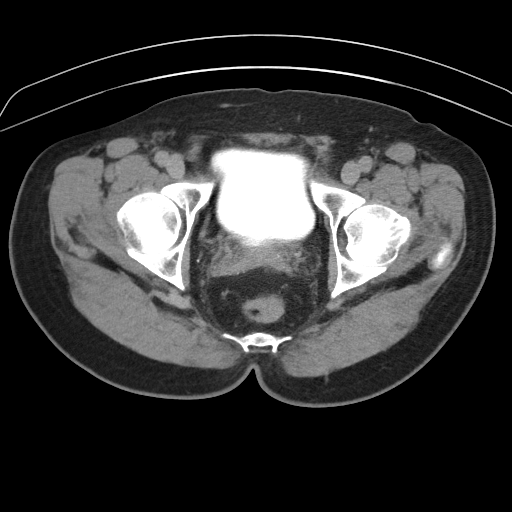
[im 36/89  soft-tissue]
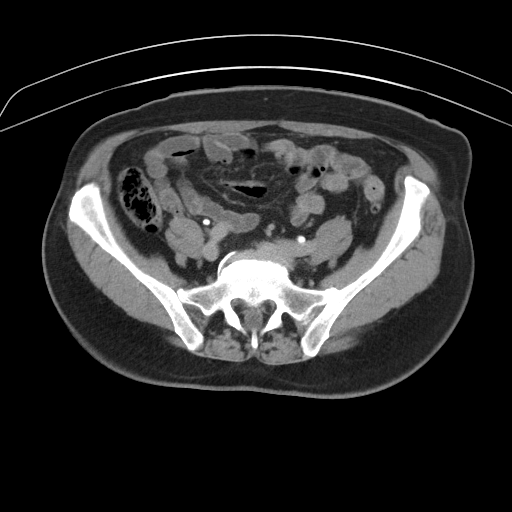
[im 53/89  soft-tissue]
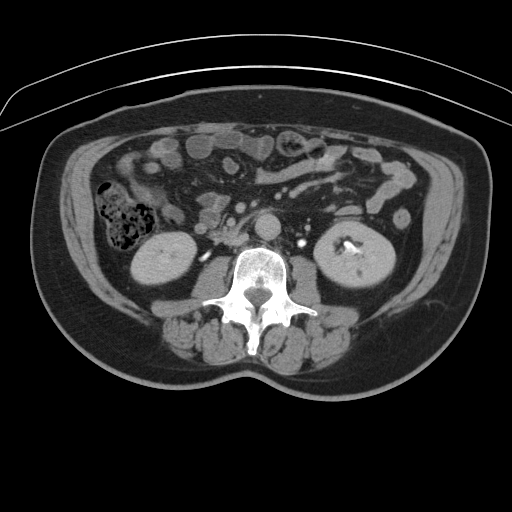
[im 71/89  soft-tissue]
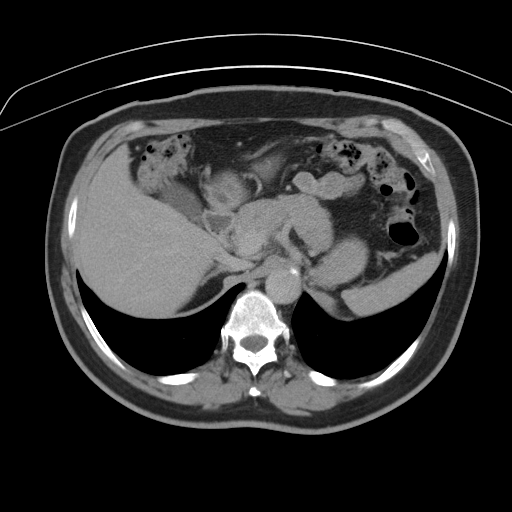

[12 of 32 positions shown; findings below may reference images not displayed]

FINDINGS: Lower chest: Peripheral atelectasis or scarring in the lingula.

Hepatobiliary: Unremarkable

Pancreas: Unremarkable

Spleen: Unremarkable

Adrenals/Urinary Tract: Hypodense 6 by 5 mm lesion in the left mid
to lower kidney on image 35/4 is probably a small cyst but
technically too small to characterize.

No stones are identified. No abnormal filling defect along the
urothelium noted. No renal mass observed.

The adrenal glands appear normal.

Stomach/Bowel: Mild sigmoid colon diverticulosis.

Vascular/Lymphatic: There is a dissection flap in the right external
iliac artery, not visualized at or distal to the common femoral
artery level. It is possible that this dissection starts in the
right common iliac artery just distal to the aortic bifurcation
although admittedly the flap less clearly seen in the common iliac
artery. Today's exam was not performed as a CT angiogram.

No pathologic adenopathy is identified.

Reproductive: The prostate gland measures 5.4 by 3.7 by 3.8 cm
(volume = 40 cm^3).

Other: No supplemental non-categorized findings.

Musculoskeletal: Lumbar spondylosis and degenerative disc disease
contributing to mild to moderate bilateral foraminal impingement at
L5-S1
IMPRESSION: 1. There is a dissection flap in the right external iliac artery.
Possible involvement of the right common iliac artery. No definite
involvement of the abdominal aorta, or of the common femoral artery.
Acuity of this dissection is uncertain. Vascular surgical referral
recommended. I recommend correlation with any right lower extremity
symptoms or in particular any signs or symptoms of ischemia in
determining urgency of vascular surgical referral.
2. Mild prostatomegaly.
3. Mild sigmoid colon diverticulosis.
4. Lumbar spondylosis and degenerative disc disease causing mild to
moderate bilateral foraminal impingement at L5-S1 due to spondylosis
and degenerative disc disease.
5. 6 by 5 mm hypodense lesion in the left mid to lower kidney is
probably a small cyst but technically too small to characterize.
6. Mild to moderate bilateral foraminal impingement at L5-S1 due to
spondylosis and degenerative disc disease.

These results were called by telephone at the time of interpretation
on 09/26/2019 at [DATE] to provider Vignolo in Dr. [REDACTED],
who verbally acknowledged these results.

## 2021-02-01 ENCOUNTER — Telehealth: Payer: Self-pay | Admitting: *Deleted

## 2021-02-01 NOTE — Addendum Note (Signed)
Addended by: Shona Needles on: 02/01/2021 12:11 PM   Modules accepted: Orders

## 2021-02-01 NOTE — Addendum Note (Signed)
Addended by: Shona Needles on: 02/01/2021 12:15 PM   Modules accepted: Orders

## 2021-02-01 NOTE — Telephone Encounter (Signed)
Labs received from:[facility] on:01/30/2021 Reviewed by:Hazel Sams, PA-C  Labs drawn:CMP14+LP+CBC/D/Plt+TSH+PSA, Hemoglobin A1C,   Results:Hemoglobin A1C 5.8

## 2021-02-01 NOTE — Addendum Note (Signed)
Addended by: Shona Needles on: 02/01/2021 12:19 PM   Modules accepted: Orders

## 2021-02-26 ENCOUNTER — Other Ambulatory Visit (HOSPITAL_COMMUNITY): Payer: Self-pay

## 2021-02-26 ENCOUNTER — Other Ambulatory Visit: Payer: Self-pay | Admitting: Physician Assistant

## 2021-02-26 ENCOUNTER — Other Ambulatory Visit: Payer: Self-pay | Admitting: Pharmacist

## 2021-02-26 MED ORDER — COSENTYX SENSOREADY (300 MG) 150 MG/ML ~~LOC~~ SOAJ
SUBCUTANEOUS | 2 refills | Status: DC
  Filled 2021-02-26: qty 2, fill #0

## 2021-02-26 MED ORDER — COSENTYX SENSOREADY (300 MG) 150 MG/ML ~~LOC~~ SOAJ
SUBCUTANEOUS | 2 refills | Status: DC
  Filled 2021-02-26: qty 2, 28d supply, fill #0
  Filled 2021-03-21: qty 2, 28d supply, fill #1
  Filled 2021-04-09: qty 2, 28d supply, fill #2

## 2021-02-26 NOTE — Telephone Encounter (Signed)
Next Visit: message sent to front desk to schedule appt, Return in about 5 months (around 05/18/2021) for Psoriatic arthritis.   Last Visit: 12/19/2020  Last Fill: 11/26/2020  DX:  Psoriatic arthritis   Current Dose per office note 12/19/2020,Cosentyx 300 mg every 28 days   Labs received from:[facility] on:01/30/2021 Reviewed by:Hazel Sams, PA-C  Labs drawn:CMP14+LP+CBC/D/Plt+TSH+PSA, Hemoglobin A1C,   Results:Hemoglobin A1C 5.8   TB Gold: 05/21/2020, negative  Okay to refill Cosentyx?

## 2021-02-26 NOTE — Telephone Encounter (Signed)
Please call patient to schedule appt. Thank you.  Return in about 5 months (around 05/18/2021) for Psoriatic arthritis.

## 2021-02-27 ENCOUNTER — Other Ambulatory Visit: Payer: Self-pay | Admitting: Rheumatology

## 2021-02-27 ENCOUNTER — Other Ambulatory Visit (HOSPITAL_COMMUNITY): Payer: Self-pay

## 2021-02-27 MED FILL — Metoprolol Succinate Tab ER 24HR 25 MG (Tartrate Equiv): ORAL | 90 days supply | Qty: 45 | Fill #0 | Status: AC

## 2021-02-28 ENCOUNTER — Other Ambulatory Visit (HOSPITAL_COMMUNITY): Payer: Self-pay

## 2021-02-28 MED ORDER — DICLOFENAC SODIUM 1 % EX GEL
CUTANEOUS | 2 refills | Status: DC
  Filled 2021-02-28: qty 400, 25d supply, fill #0

## 2021-03-04 ENCOUNTER — Other Ambulatory Visit (HOSPITAL_COMMUNITY): Payer: Self-pay

## 2021-03-05 ENCOUNTER — Other Ambulatory Visit (HOSPITAL_COMMUNITY): Payer: Self-pay

## 2021-03-07 ENCOUNTER — Other Ambulatory Visit (HOSPITAL_COMMUNITY): Payer: Self-pay

## 2021-03-19 ENCOUNTER — Other Ambulatory Visit (HOSPITAL_COMMUNITY): Payer: Self-pay

## 2021-03-19 MED FILL — Atorvastatin Calcium Tab 40 MG (Base Equivalent): ORAL | 90 days supply | Qty: 90 | Fill #0 | Status: AC

## 2021-03-20 ENCOUNTER — Other Ambulatory Visit (HOSPITAL_COMMUNITY): Payer: Self-pay

## 2021-03-21 ENCOUNTER — Other Ambulatory Visit (HOSPITAL_COMMUNITY): Payer: Self-pay

## 2021-03-23 ENCOUNTER — Other Ambulatory Visit (HOSPITAL_COMMUNITY): Payer: Self-pay

## 2021-03-26 ENCOUNTER — Other Ambulatory Visit: Payer: Self-pay | Admitting: Gastroenterology

## 2021-03-26 ENCOUNTER — Telehealth: Payer: Self-pay | Admitting: *Deleted

## 2021-03-26 ENCOUNTER — Other Ambulatory Visit (HOSPITAL_COMMUNITY): Payer: Self-pay

## 2021-03-26 DIAGNOSIS — K219 Gastro-esophageal reflux disease without esophagitis: Secondary | ICD-10-CM | POA: Diagnosis not present

## 2021-03-26 DIAGNOSIS — Z1211 Encounter for screening for malignant neoplasm of colon: Secondary | ICD-10-CM | POA: Diagnosis not present

## 2021-03-26 DIAGNOSIS — R933 Abnormal findings on diagnostic imaging of other parts of digestive tract: Secondary | ICD-10-CM | POA: Diagnosis not present

## 2021-03-26 MED ORDER — CLENPIQ 10-3.5-12 MG-GM -GM/160ML PO SOLN
ORAL | 0 refills | Status: DC
  Filled 2021-03-26: qty 320, 1d supply, fill #0

## 2021-03-26 NOTE — Telephone Encounter (Signed)
   [facility] Pre-operative Risk Assessment    Patient Name: Ricardo [name]  DOB: 21-Apr-1956  MRN: [id number]   HEARTCARE STAFF: - Please ensure there is not already an duplicate clearance open for this procedure. - Under Visit Info/Reason for Call, type in Other and utilize the format Clearance MM/DD/YY or Clearance TBD. Do not use dashes or single digits. - If request is for dental extraction, please clarify the # of teeth to be extracted.  Request for surgical clearance:  1. What type of surgery is being performed?  EGD & COLONOSCOPY    2. When is this surgery scheduled? 05/14/2021   3. What type of clearance is required (medical clearance vs. Pharmacy clearance to hold med vs. Both)?  BOTH  4. Are there any medications that need to be held prior to surgery and how long? PLAVIX   5. Practice name and name of physician performing surgery?  [facility]   6. What is the office phone number?  [phone]   7.   What is the office fax number?  [phone]  8.   Anesthesia type (None, local, MAC, general) ?  PROPOFOL   Jeanann Lewandowsky 03/26/2021, 9:49 AM  _________________________________________________________________   (provider comments below)

## 2021-03-26 NOTE — Telephone Encounter (Signed)
   Name: Waymon Laser DOB: 10-05-1956  MRN: [id number]  Primary Cardiologist: Larae Grooms, MD  Chart reviewed as part of pre-operative protocol coverage.   65 y.o. male with . Coronary artery disease  o S/p DES to LAD and DES to OM in 2019 . Basal cell CA . Hyperlipidemia  . Echocardiogram 11/21: normal EF, no sig valve dz  Last OV:  08/29/20 with [name]  Procedure:  EGD/colo Rx:  Hold Plavix  RCRI:  Perioperative Risk of Major Cardiac Event is (%): 0.9 (low risk)  Pt has multiple stents and currently taking single antiplatelet Rx.  Will ask [name] if it is ok to be off of Plavix for colonoscopy.  Richardson Dopp, PA-C 03/26/2021, 4:54 PM

## 2021-03-27 ENCOUNTER — Other Ambulatory Visit (HOSPITAL_COMMUNITY): Payer: Self-pay

## 2021-03-27 NOTE — Telephone Encounter (Signed)
If GI is ok with him being on aspirin, that is fine with me.  ASA 81 mg daily.  OK to hold Plavix 5 days prior to endoscopy.  JV

## 2021-03-28 ENCOUNTER — Other Ambulatory Visit (HOSPITAL_COMMUNITY): Payer: Self-pay

## 2021-03-28 NOTE — Telephone Encounter (Signed)
   Patient Name: Ricardo [name]  DOB: 03-Nov-1956  MRN: [id number]   Primary Cardiologist: Larae Grooms, MD  Chart reviewed as part of pre-operative protocol coverage. The patient's primary cardiologist has authorized Plavix to be held 5 days prior to the procedure. Will route to GI for input regarding the patient taking aspirin 81 mg daily throughout the peri-procedure timeframe in place of Plavix. Will contact patient once we have heard back from GI.   Dr. Collene Mares,   Are you ok if we place the patient on aspirin 81 mg daily throughout the peri-procedure timeframe while they are off Plavix?    Christell Faith, PA-C 03/28/2021, 9:59 AM

## 2021-04-02 NOTE — Telephone Encounter (Signed)
   Name: Ricardo [name]  DOB: 11-Apr-1956  MRN: [id number]   Primary Cardiologist: Larae Grooms, MD  Chart reviewed as part of pre-operative protocol coverage. Patient was contacted 04/02/2021 in reference to pre-operative risk assessment for pending surgery as outlined below.  Illya Gienger was last seen on 08/29/20 by [name].  Since that day, Victorhugo Preis has done well from a cardiac standpoint. He can easily complete 4 Mets without anginal complaints.  Therefore, based on ACC/AHA guidelines, the patient would be at acceptable risk for the planned procedure without further cardiovascular testing.   The patient was advised that if he develops new symptoms prior to surgery to contact our office to arrange for a follow-up visit, and he verbalized understanding.  Per [name], patient can hold plavix 5 days prior to his upcoming colonoscopy and should restart plavix when cleared to do so by his gastroenterologist. Per [name], patient should start aspirin 81 mg daily while off plavix given extensive CAD and multiple stents.   I will route this recommendation to the requesting party via Epic fax function and remove from pre-op pool. Please call with questions.  Abigail Butts, PA-C 04/02/2021, 12:55 PM

## 2021-04-02 NOTE — Telephone Encounter (Signed)
   Name: Ricardo [name]  DOB: [birth year]  MRN: [id number]   Primary Cardiologist: Larae Grooms, MD  Chart reviewed as part of pre-operative protocol coverage.   Left a voicemail for patient to cal back for ongoing preop assessment.  Per [name], patient can hold plavix 5 days prior to his upcoming colonoscopy and should restart plavix when cleared to do so by his gastroenterologist. Per [name], patient should start aspirin 81 mg daily while off plavix given extensive CAD and multiple stents.    Abigail Butts, PA-C 04/02/2021, 10:23 AM

## 2021-04-09 ENCOUNTER — Other Ambulatory Visit (HOSPITAL_COMMUNITY): Payer: Self-pay

## 2021-04-10 NOTE — Telephone Encounter (Signed)
Our office received a duplicate clearance request for the pt. I do see that clearance has been given and notes faxed to requesting office. I will re-fax clearance notes to requesting office.

## 2021-04-11 ENCOUNTER — Other Ambulatory Visit (HOSPITAL_COMMUNITY): Payer: Self-pay

## 2021-04-17 ENCOUNTER — Other Ambulatory Visit (HOSPITAL_COMMUNITY): Payer: Self-pay

## 2021-04-17 MED FILL — Clopidogrel Bisulfate Tab 75 MG (Base Equiv): ORAL | 90 days supply | Qty: 90 | Fill #0 | Status: CN

## 2021-04-18 ENCOUNTER — Other Ambulatory Visit (HOSPITAL_COMMUNITY): Payer: Self-pay

## 2021-04-23 ENCOUNTER — Other Ambulatory Visit (HOSPITAL_COMMUNITY): Payer: Self-pay

## 2021-04-25 ENCOUNTER — Other Ambulatory Visit (HOSPITAL_COMMUNITY): Payer: Self-pay

## 2021-04-27 ENCOUNTER — Other Ambulatory Visit (HOSPITAL_COMMUNITY): Payer: Self-pay

## 2021-04-27 MED FILL — Clopidogrel Bisulfate Tab 75 MG (Base Equiv): ORAL | 90 days supply | Qty: 90 | Fill #0 | Status: AC

## 2021-04-30 ENCOUNTER — Other Ambulatory Visit: Payer: Self-pay

## 2021-05-06 ENCOUNTER — Telehealth: Payer: Self-pay | Admitting: Physician Assistant

## 2021-05-06 NOTE — Telephone Encounter (Signed)
Telephone call- Covering Dr. Collene Mares for the holiday  05/06/2021 1:36 PM  Patient called on-call service on July 4 to tell me that he was in [place] over the weekend and "ate and drink all sorts of things", and has been having some diarrhea over the past couple days.  He is due to have his EGD and colonoscopy tomorrow at [facility] and wanted to make sure that him having diarrhea would not prohibit him from drinking the bowel prep.  He denies any abdominal pain, no fever or chills and no bleeding.  I told him that it was perfectly safe to continue with the bowel prep unless he developed other symptoms.  He can certainly let Dr. Collene Mares know what has been going on when he gets there tomorrow.  Ellouise Newer, PA-C

## 2021-05-07 ENCOUNTER — Encounter (HOSPITAL_COMMUNITY): Payer: Self-pay | Admitting: Gastroenterology

## 2021-05-07 ENCOUNTER — Ambulatory Visit (HOSPITAL_COMMUNITY): Payer: 59 | Admitting: Anesthesiology

## 2021-05-07 ENCOUNTER — Other Ambulatory Visit: Payer: Self-pay

## 2021-05-07 ENCOUNTER — Encounter (HOSPITAL_COMMUNITY)
Admission: RE | Disposition: A | Discharge status: Home / Self Care | Payer: Self-pay | Source: Home / Self Care | Attending: Gastroenterology

## 2021-05-07 ENCOUNTER — Ambulatory Visit (HOSPITAL_COMMUNITY)
Admission: RE | Admit: 2021-05-07 | Discharge: 2021-05-07 | Disposition: A | Discharge status: Home / Self Care | Payer: 59 | Attending: Gastroenterology | Admitting: Gastroenterology

## 2021-05-07 DIAGNOSIS — Z1211 Encounter for screening for malignant neoplasm of colon: Secondary | ICD-10-CM | POA: Diagnosis not present

## 2021-05-07 DIAGNOSIS — I2510 Atherosclerotic heart disease of native coronary artery without angina pectoris: Secondary | ICD-10-CM | POA: Diagnosis not present

## 2021-05-07 DIAGNOSIS — K5730 Diverticulosis of large intestine without perforation or abscess without bleeding: Secondary | ICD-10-CM | POA: Diagnosis not present

## 2021-05-07 DIAGNOSIS — Z87891 Personal history of nicotine dependence: Secondary | ICD-10-CM | POA: Insufficient documentation

## 2021-05-07 DIAGNOSIS — Z79899 Other long term (current) drug therapy: Secondary | ICD-10-CM | POA: Insufficient documentation

## 2021-05-07 DIAGNOSIS — Z9189 Other specified personal risk factors, not elsewhere classified: Secondary | ICD-10-CM | POA: Diagnosis not present

## 2021-05-07 DIAGNOSIS — Z955 Presence of coronary angioplasty implant and graft: Secondary | ICD-10-CM | POA: Diagnosis not present

## 2021-05-07 DIAGNOSIS — K648 Other hemorrhoids: Secondary | ICD-10-CM | POA: Insufficient documentation

## 2021-05-07 DIAGNOSIS — Z7902 Long term (current) use of antithrombotics/antiplatelets: Secondary | ICD-10-CM | POA: Insufficient documentation

## 2021-05-07 DIAGNOSIS — K219 Gastro-esophageal reflux disease without esophagitis: Secondary | ICD-10-CM | POA: Diagnosis not present

## 2021-05-07 HISTORY — DX: Failed or difficult intubation, initial encounter: T88.4XXA

## 2021-05-07 HISTORY — PX: ESOPHAGOGASTRODUODENOSCOPY (EGD) WITH PROPOFOL: SHX5813

## 2021-05-07 HISTORY — PX: COLONOSCOPY WITH PROPOFOL: SHX5780

## 2021-05-07 SURGERY — ESOPHAGOGASTRODUODENOSCOPY (EGD) WITH PROPOFOL
Anesthesia: Monitor Anesthesia Care

## 2021-05-07 MED ORDER — ONDANSETRON HCL 4 MG/2ML IJ SOLN
INTRAMUSCULAR | Status: DC | PRN
  Administered 2021-05-07: 4 mg via INTRAVENOUS

## 2021-05-07 MED ORDER — LACTATED RINGERS IV SOLN
INTRAVENOUS | Status: DC
  Administered 2021-05-07: 1000 mL via INTRAVENOUS

## 2021-05-07 MED ORDER — PROPOFOL 10 MG/ML IV BOLUS
INTRAVENOUS | Status: DC | PRN
  Administered 2021-05-07: 10 mg via INTRAVENOUS
  Administered 2021-05-07: 40 mg via INTRAVENOUS

## 2021-05-07 MED ORDER — PROPOFOL 1000 MG/100ML IV EMUL
INTRAVENOUS | Status: AC
  Filled 2021-05-07: qty 100

## 2021-05-07 MED ORDER — LIDOCAINE 2% (20 MG/ML) 5 ML SYRINGE
INTRAMUSCULAR | Status: DC | PRN
  Administered 2021-05-07: 60 mg via INTRAVENOUS

## 2021-05-07 MED ORDER — SODIUM CHLORIDE 0.9 % IV SOLN: INTRAVENOUS | Status: DC

## 2021-05-07 MED ORDER — PROPOFOL 500 MG/50ML IV EMUL
INTRAVENOUS | Status: DC | PRN
  Administered 2021-05-07: 125 ug/kg/min via INTRAVENOUS

## 2021-05-07 MED ORDER — PROPOFOL 500 MG/50ML IV EMUL
INTRAVENOUS | Status: AC
  Filled 2021-05-07: qty 50

## 2021-05-07 SURGICAL SUPPLY — 24 items

## 2021-05-07 NOTE — H&P (Signed)
Ricardo [name] is an 64 y.o. male.   Chief Complaint: Colorectal cancer screening. 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  He is being done at the hospital because he has a history of difficult intubation.  Additionally he has problems with acid reflux and is going to undergo an EGD as well his history is complicated with heart disease requiring stent placement in 2019 he is currently on Clopidogrel which has been held for the last 3 days after cardiac clearance was procured from his cardiologist.  See office notes for further details  Past Medical History:  Diagnosis Date   Coronary heart disease    2 stent placed March 2019   DDD (degenerative disc disease), cervical 11/19/2018   Difficult intubation    pt stated small airway   H/O heart artery stent    History of coronary artery disease 11/19/2018   Status post stent placement March 2019   History of seizures    No seizures in over 20 years.    Psoriasis 11/19/2018   clobetasol   Psoriatic arthritis ([place])    On Cosentyx since 2017    Past Surgical History:  Procedure Laterality Date   COLONOSCOPY  2007 & 2016   Normal   CYSTOSCOPY Right 2009   Removal of Ureteric Stone   CYSTOSCOPY  12/2019   HEMORRHOID SURGERY  2004   External hemorrhoid removed    HERNIA REPAIR     INGUINAL HERNIA REPAIR  1976   PILONIDAL CYST / SINUS EXCISION  2001   THORACOTOMY  1997   and biopsy   UPPER GI ENDOSCOPY  2007 & 2016   Normal    Family History  Problem Relation Age of Onset   Heart disease Father    Social History:  reports that he quit smoking about 29 years ago. His smoking use included cigarettes. He has never used smokeless tobacco. He reports current alcohol use. He reports that he does not use drugs.  Allergies: No Known Allergies  Medications Prior to Admission  Medication Sig Dispense Refill   atorvastatin (LIPITOR) 40 MG tablet TAKE 1 TABLET BY MOUTH ONCE  A DAY (Patient taking differently: Take 40 mg by mouth daily.) 90 tablet 2   clobetasol cream (TEMOVATE) 1.06 % Apply 1 application topically 2 (two) times daily. (Patient taking differently: Apply 1 application topically daily as needed (Psoriasis).) 45 g 1   clopidogrel (PLAVIX) 75 MG tablet TAKE 1 TABLET BY MOUTH DAILY. (Patient taking differently: Take 75 mg by mouth daily.) 90 tablet 3   Coenzyme Q10 200 MG capsule Take 200 mg by mouth daily.      COSENTYX SENSOREADY, 300 MG, 150 MG/ML SOAJ INJECT 300MG  INTO THE SKIN EVERY 28 DAYS. (Patient taking differently: Inject 300 mg into the skin every 30 (thirty) days.) 2 mL 2   Cyanocobalamin (VITAMIN B12) 1000 MCG TBCR Take 1,000 mg by mouth 2 (two) times a week. Monday- Thursday     loratadine (CLARITIN) 10 MG tablet Take 10 mg by mouth daily as needed for allergies.     metoprolol succinate (TOPROL-XL) 25 MG 24 hr tablet TAKE 1/2 TABLET (12.5 MG TOTAL) BY MOUTH ONCE A DAY (Patient taking differently: Take 12.5 mg by mouth daily.) 90 tablet 3   Multiple Vitamin (MULTIVITAMIN PO) Take 1 tablet by mouth daily.     nitroGLYCERIN (NITROSTAT) 0.4 MG SL tablet Place 1 tablet (0.4 mg total) under the tongue every 5 (five)  minutes as needed for chest pain. 25 tablet 3   Sod Picosulfate-Mag Ox-Cit Acd (CLENPIQ) 10-3.5-12 MG-GM -GM/160ML SOLN DRINK 160 MLS AS DIRECTED PER OFFICE. (do not take as directed by packaging instructions) 320 mL 0   atovaquone-proguanil (MALARONE) 250-100 MG TABS tablet TAKE 1 TABLET BY MOUTH DAILY START 2 DAY PRE ARRIVAL, CONTINUE DURING THE STAY,THEN CONTINUE 7 DAYS POST TRAVEL FOR MALARIA PREVENTION (Patient not taking: Reported on 04/29/2021) 28 tablet 0   diclofenac Sodium (VOLTAREN) 1 % GEL Apply 2-4 grams to affected joint up to 4 times daily as needed (Patient not taking: Reported on 04/29/2021) 400 g 2   tamsulosin (FLOMAX) 0.4 MG CAPS capsule TAKE 1 CAPSULE BY MOUTH ONCE A DAY (Patient not taking: Reported on 04/29/2021) 30  capsule 1    No results found for this or any previous visit (from the past 48 hour(s)). No results found.  Review of Systems  Constitutional: Negative.   HENT: Negative.    Eyes: Negative.   Respiratory: Negative.    Gastrointestinal: Negative.   Endocrine: Negative.   Genitourinary: Negative.   Musculoskeletal:  Positive for arthralgias and joint swelling.  Allergic/Immunologic: Negative.   Neurological: Negative.  Negative for dizziness, seizures, facial asymmetry, speech difficulty, light-headedness and numbness.  Hematological: Negative.   Psychiatric/Behavioral: Negative.     Blood pressure (!) 142/83, pulse 65, resp. rate 20, height 5\' 9"  (1.753 m), weight 70.8 kg, SpO2 98 %. Physical Exam Constitutional:      General: He is not in acute distress.    Appearance: Normal appearance.  HENT:     Head: Normocephalic and atraumatic.     Mouth/Throat:     Mouth: Mucous membranes are moist.  Eyes:     Extraocular Movements: Extraocular movements intact.     Pupils: Pupils are equal, round, and reactive to light.  Cardiovascular:     Rate and Rhythm: Normal rate and regular rhythm.  Pulmonary:     Effort: Pulmonary effort is normal.     Breath sounds: Normal breath sounds.  Abdominal:     General: Bowel sounds are normal.     Palpations: Abdomen is soft.  Musculoskeletal:        General: Normal range of motion.     Cervical back: Normal range of motion and neck supple.  Skin:    General: Skin is warm and dry.  Neurological:     General: No focal deficit present.     Mental Status: He is alert.  Psychiatric:        Mood and Affect: Mood normal.        Behavior: Behavior normal.        Thought Content: Thought content normal.        Judgment: Judgment normal.    Assessment/Plan Colorectal cancer screening/acid reflux-proceed with a EGD and a colonoscopy at this time.  Juanita Craver, MD 05/07/2021, 7:00 AM

## 2021-05-07 NOTE — Op Note (Addendum)
[facility] Patient Name: Ricardo [name] Procedure Date: 05/07/2021 MRN: [id number] Attending MD: Juanita Craver , MD Date of Birth: [birth year] CSN: [id number] Age: 65 Admit Type: Outpatient Procedure:                Diagnostic EGD. Indications:              Gastro-esophageal reflux disease Providers:                Juanita Craver, MD, Kary Kos RN, RN, Terrall Laity, Tyrone Apple, Technician, Hinton Dyer,                            Virgia Land, CRNA Referring MD:             Bo Merino MD Medicines:                Monitored Anesthesia Care Complications:            No immediate complications. Estimated Blood Loss:     Estimated blood loss: none. Procedure:                Pre-Anesthesia Assessment: - Prior to the                            procedure, a history and physical was performed,                            and patient medications and allergies were                            reviewed. The patient's tolerance of previous                            anesthesia was also reviewed. The risks and                            benefits of the procedure and the sedation options                            and risks were discussed with the patient. All                            questions were answered, and informed consent was                            obtained. Prior Anticoagulants: The patient has                            taken Plavix (Clopidogrel), last dose was 5 days                            prior to procedure. ASA Grade Assessment: III - A  patient with severe systemic disease. After                            reviewing the risks and benefits, the patient was                            deemed in satisfactory condition to undergo the                            procedure. After obtaining informed consent, the                            endoscope was passed under direct vision.                             Throughout the procedure, the patient's blood                            pressure, pulse, and oxygen saturations were                            monitored continuously. The GIF-H190 ([id number])                            Olympus gastroscope was introduced through the                            mouth, and advanced to the second part of duodenum.                            The EGD was accomplished without difficulty. The                            patient tolerated the procedure well. Findings:      The examined esophagus and GEJ appeared widely patent and normal; SCJ       was measured at 38 cm; no Barrett's or esophagitis noted.      The entire examined stomach was normal.      The cardia and gastric fundus were normal on retroflexion.      The examined duodenum was normal. Impression:               - Normal appearing esophagus and GEJ; no Barrett's                            or esophagitis.                           - Normal appearing stomach.                           - Normal examined duodenum.                           - No specimens collected. Moderate Sedation:      MAC used. Recommendation:           -  High fiber diet with augmented water consumption                            daily.                           - Continue present medications.                           - Return to my office in 4 weeks. Procedure Code(s):        --- Professional ---                           [phone], Esophagogastroduodenoscopy, flexible,                            transoral; diagnostic, including collection of                            specimen(s) by brushing or washing, when performed                            (separate procedure) Diagnosis Code(s):        --- Professional ---                           K21.9, Gastro-esophageal reflux disease without                            esophagitis CPT copyright 2019 American Medical Association. All rights reserved. The codes documented in this report are  preliminary and upon coder review may  be revised to meet current compliance requirements. Juanita Craver, MD Juanita Craver, MD 05/07/2021 8:09:53 AM This report has been signed electronically. Number of Addenda: 0

## 2021-05-07 NOTE — Anesthesia Postprocedure Evaluation (Signed)
Anesthesia Post Note  Patient: Ricardo [name]  Procedure(s) Performed: ESOPHAGOGASTRODUODENOSCOPY (EGD) WITH PROPOFOL COLONOSCOPY WITH PROPOFOL     Patient location during evaluation: Endoscopy Anesthesia Type: MAC Level of consciousness: awake and alert Pain management: pain level controlled Vital Signs Assessment: post-procedure vital signs reviewed and stable Respiratory status: spontaneous breathing, nonlabored ventilation, respiratory function stable and patient connected to nasal cannula oxygen Cardiovascular status: blood pressure returned to baseline and stable Postop Assessment: no apparent nausea or vomiting Anesthetic complications: no   No notable events documented.  Last Vitals:  Vitals:   05/07/21 0800 05/07/21 0810  BP: 113/65 125/70  Pulse: (!) 56 63  Resp: 13 18  Temp: 36.4 C   SpO2: 100% 100%    Last Pain:  Vitals:   05/07/21 0810  TempSrc:   PainSc: 0-No pain                 [name] L [name]

## 2021-05-07 NOTE — Discharge Instructions (Signed)

## 2021-05-07 NOTE — Anesthesia Preprocedure Evaluation (Addendum)
Anesthesia Evaluation  Patient identified by MRN, date of birth, ID band Patient awake    Reviewed: Allergy & Precautions, NPO status , Patient's Chart, lab work & pertinent test results, reviewed documented beta blocker date and time   History of Anesthesia Complications (+) DIFFICULT AIRWAY and history of anesthetic complications (had to use pedatric ETT in 1997 per pt, no previous anesthetic records)  Airway Mallampati: I  TM Distance: >3 FB Neck ROM: Full    Dental no notable dental hx. (+) Teeth Intact, Dental Advisory Given   Pulmonary neg pulmonary ROS, former smoker,    Pulmonary exam normal breath sounds clear to auscultation       Cardiovascular + CAD and + Cardiac Stents (2019)  Normal cardiovascular exam Rhythm:Regular Rate:Normal     Neuro/Psych Seizures - (remote history), Well Controlled,  negative psych ROS   GI/Hepatic negative GI ROS, Neg liver ROS,   Endo/Other  negative endocrine ROS  Renal/GU negative Renal ROS  negative genitourinary   Musculoskeletal  (+) Arthritis ,   Abdominal   Peds  Hematology  (+) Blood dyscrasia (on plavix), ,   Anesthesia Other Findings   Reproductive/Obstetrics                            Anesthesia Physical Anesthesia Plan  ASA: 3  Anesthesia Plan: MAC   Post-op Pain Management:    Induction: Intravenous  PONV Risk Score and Plan: Propofol infusion and Treatment may vary due to age or medical condition  Airway Management Planned: Natural Airway  Additional Equipment:   Intra-op Plan:   Post-operative Plan:   Informed Consent: I have reviewed the patients History and Physical, chart, labs and discussed the procedure including the risks, benefits and alternatives for the proposed anesthesia with the patient or authorized representative who has indicated his/her understanding and acceptance.     Dental advisory given  Plan  Discussed with: CRNA  Anesthesia Plan Comments:         Anesthesia Quick Evaluation

## 2021-05-07 NOTE — Transfer of Care (Signed)
Immediate Anesthesia Transfer of Care Note  Patient: Tiran Sauseda  Procedure(s) Performed: ESOPHAGOGASTRODUODENOSCOPY (EGD) WITH PROPOFOL COLONOSCOPY WITH PROPOFOL  Patient Location: PACU and Endoscopy Unit  Anesthesia Type:MAC  Level of Consciousness: awake, alert  and oriented  Airway & Oxygen Therapy: Patient Spontanous Breathing and Patient connected to face mask oxygen  Post-op Assessment: Report given to RN and Post -op Vital signs reviewed and stable  Post vital signs: Reviewed and stable  Last Vitals:  Vitals Value Taken Time  BP 113/65 05/07/21 0800  Temp    Pulse    Resp 8 05/07/21 0800  SpO2    Vitals shown include unvalidated device data.  Last Pain:  Vitals:   05/07/21 0640  PainSc: 0-No pain         Complications: No notable events documented.

## 2021-05-07 NOTE — Op Note (Signed)
[facility] Patient Name: Ricardo [name] Procedure Date: 05/07/2021 MRN: [id number] Attending MD: Juanita Craver , MD Date of Birth: [birth year] CSN: [id number] Age: 65 Admit Type: Outpatient Procedure:                Screening colonoscopy. Indications:              Colorectal cancer screening. Providers:                Juanita Craver, MD, Kary Kos RN, RN, Terrall Laity, Tyrone Apple, Technician, Hinton Dyer,                            Virgia Land, CRNA Referring MD:             Bo Merino MD Medicines:                Monitored Anesthesia Care Complications:            No immediate complications. Estimated Blood Loss:     Estimated blood loss: none. Procedure:                Pre-Anesthesia Assessment: - Prior to the                            procedure, a history and physical was performed,                            and patient medications and allergies were                            reviewed. The patient's tolerance of previous                            anesthesia was also reviewed. The risks and                            benefits of the procedure and the sedation options                            and risks were discussed with the patient. All                            questions were answered, and informed consent was                            obtained. Prior Anticoagulants: The patient has                            taken Plavix (clopidogrel), last dose was 5 days                            prior to procedure. ASA Grade Assessment: III - A  patient with severe systemic disease. After                            reviewing the risks and benefits, the patient was                            deemed in satisfactory condition to undergo the                            procedure.                           After obtaining informed consent, the colonoscope                            was passed under direct  vision. Throughout the                            procedure, the patient's blood pressure, pulse, and                            oxygen saturations were monitored continuously. The                            CF-HQ190L ([id number]) Olympus colonoscope was                            introduced through the anus and advanced to the the                            cecum, identified by appendiceal orifice and                            ileocecal valve. The colonoscopy was performed                            without difficulty. The patient tolerated the                            procedure well. The quality of the bowel                            preparation was good. The ileocecal valve, the                            appendiceal orifice and the rectum were                            photographed. The bowel preparation used was                            Clenpiq via split dose instruction. Scope In: 7:41:15 AM Scope Out: 7:54:30 AM Scope Withdrawal Time: 0 hours 8 minutes 27 seconds  Total Procedure Duration: 0 hours 13 minutes 15 seconds  Findings:  The perianal and digital rectal examinations were normal.      A few small-mouthed diverticula were found in the entire colon.      Small internal hemorrhoids were noted on retroflexion.      The exam was otherwise without abnormality. Impression:               - Few small scattered diverticula in the entire                            examined colon.                           - Small internal hemorrhoids.                           - The examination was otherwise normal.                           - No specimens collected. Moderate Sedation:      MAC used. Recommendation:           - High fiber diet with augmented water consumption                            daily.                           - Continue present medications.                           - Repeat colonoscopy in 10 years for surveillance.                           - Return to GI office  in 4 weeks.                           - If the patient has any abnormal GI symptoms in                            the interim, he has been advised to call the office                            ASAP for further recommendations. Procedure Code(s):        --- Professional ---                           [phone], Colonoscopy, flexible; diagnostic, including                            collection of specimen(s) by brushing or washing,                            when performed (separate procedure) Diagnosis Code(s):        --- Professional ---                           Z12.11, Encounter for screening for  malignant                            neoplasm of colon                           K57.30, Diverticulosis of large intestine without                            perforation or abscess without bleeding CPT copyright 2019 American Medical Association. All rights reserved. The codes documented in this report are preliminary and upon coder review may  be revised to meet current compliance requirements. Juanita Craver, MD Juanita Craver, MD 05/07/2021 8:14:03 AM This report has been signed electronically. Number of Addenda: 0

## 2021-05-10 ENCOUNTER — Encounter: Payer: Self-pay | Admitting: Rheumatology

## 2021-05-10 ENCOUNTER — Other Ambulatory Visit (HOSPITAL_COMMUNITY): Payer: Self-pay

## 2021-05-10 ENCOUNTER — Other Ambulatory Visit: Payer: Self-pay

## 2021-05-10 ENCOUNTER — Other Ambulatory Visit: Payer: Self-pay | Admitting: Physician Assistant

## 2021-05-10 DIAGNOSIS — Z9225 Personal history of immunosupression therapy: Secondary | ICD-10-CM

## 2021-05-10 DIAGNOSIS — Z79899 Other long term (current) drug therapy: Secondary | ICD-10-CM

## 2021-05-10 DIAGNOSIS — Z111 Encounter for screening for respiratory tuberculosis: Secondary | ICD-10-CM | POA: Diagnosis not present

## 2021-05-10 MED ORDER — COSENTYX SENSOREADY (300 MG) 150 MG/ML ~~LOC~~ SOAJ
SUBCUTANEOUS | 2 refills | Status: DC
  Filled 2021-05-10: qty 2, 28d supply, fill #0

## 2021-05-10 NOTE — Telephone Encounter (Signed)
Next Visit: 07/10/2021  Last Visit: 12/19/2020  Last Fill: 02/26/2021  DX: Psoriatic arthritis  Current Dose per office note 12/19/2020: Cosentyx 300 mg every 28 days  Labs: 01/30/2021 CBC/CMP WNL  TB Gold: 05/21/2020 Neg    Patient advised he is due to update labs. Patient states he will come in on 05/13/2021 to update.  Okay to refill Cosentyx?

## 2021-05-10 NOTE — Telephone Encounter (Signed)
Please check on refill request.

## 2021-05-10 NOTE — Telephone Encounter (Signed)
Please make sure there are future orders for CBC with diff, CMP with GFR, and TB gold.

## 2021-05-12 LAB — CBC WITH DIFFERENTIAL/PLATELET
Absolute Monocytes: 502 cells/uL (ref 200–950)
Basophils Absolute: 59 cells/uL (ref 0–200)
Basophils Relative: 0.9 %
Eosinophils Absolute: 337 cells/uL (ref 15–500)
Eosinophils Relative: 5.1 %
HCT: 40.3 % (ref 38.5–50.0)
Hemoglobin: 13.5 g/dL (ref 13.2–17.1)
Lymphs Abs: 2066 cells/uL (ref 850–3900)
MCH: 30.5 pg (ref 27.0–33.0)
MCHC: 33.5 g/dL (ref 32.0–36.0)
MCV: 91.2 fL (ref 80.0–100.0)
MPV: 10.7 fL (ref 7.5–12.5)
Monocytes Relative: 7.6 %
Neutro Abs: 3637 cells/uL (ref 1500–7800)
Neutrophils Relative %: 55.1 %
Platelets: 247 10*3/uL (ref 140–400)
RBC: 4.42 10*6/uL (ref 4.20–5.80)
RDW: 12 % (ref 11.0–15.0)
Total Lymphocyte: 31.3 %
WBC: 6.6 10*3/uL (ref 3.8–10.8)

## 2021-05-12 LAB — COMPLETE METABOLIC PANEL WITH GFR
AG Ratio: 1.7 (calc) (ref 1.0–2.5)
ALT: 19 U/L (ref 9–46)
AST: 20 U/L (ref 10–35)
Albumin: 4.3 g/dL (ref 3.6–5.1)
Alkaline phosphatase (APISO): 62 U/L (ref 35–144)
BUN: 12 mg/dL (ref 7–25)
CO2: 32 mmol/L (ref 20–32)
Calcium: 9.4 mg/dL (ref 8.6–10.3)
Chloride: 102 mmol/L (ref 98–110)
Creat: 0.8 mg/dL (ref 0.70–1.25)
GFR, Est African American: 109 mL/min/{1.73_m2} (ref >=60)
GFR, Est Non African American: 94 mL/min/{1.73_m2} (ref >=60)
Globulin: 2.6 g/dL (calc) (ref 1.9–3.7)
Glucose, Bld: 82 mg/dL (ref 65–99)
Potassium: 4.4 mmol/L (ref 3.5–5.3)
Sodium: 141 mmol/L (ref 135–146)
Total Bilirubin: 0.4 mg/dL (ref 0.2–1.2)
Total Protein: 6.9 g/dL (ref 6.1–8.1)

## 2021-05-12 LAB — QUANTIFERON-TB GOLD PLUS
Mitogen-NIL: >10 IU/mL
NIL: 0.02 IU/mL
QuantiFERON-TB Gold Plus: NEGATIVE
TB1-NIL: 0.07 IU/mL
TB2-NIL: 0.02 IU/mL

## 2021-05-13 NOTE — Progress Notes (Signed)
TB Gold is negative, CBC and CMP are normal.

## 2021-05-14 ENCOUNTER — Other Ambulatory Visit (HOSPITAL_COMMUNITY): Payer: Self-pay

## 2021-05-17 ENCOUNTER — Other Ambulatory Visit (HOSPITAL_COMMUNITY): Payer: Self-pay

## 2021-05-17 ENCOUNTER — Other Ambulatory Visit: Payer: Self-pay | Admitting: Pharmacist

## 2021-05-17 MED ORDER — COSENTYX SENSOREADY (300 MG) 150 MG/ML ~~LOC~~ SOAJ
SUBCUTANEOUS | 2 refills | Status: DC
  Filled 2021-05-17: qty 2, fill #0
  Filled 2021-06-07: qty 2, 28d supply, fill #0
  Filled 2021-07-22: qty 2, 28d supply, fill #1
  Filled 2021-08-23: qty 2, 28d supply, fill #2

## 2021-05-17 MED FILL — Metoprolol Succinate Tab ER 24HR 25 MG (Tartrate Equiv): ORAL | 90 days supply | Qty: 45 | Fill #1 | Status: AC

## 2021-05-24 DIAGNOSIS — G4733 Obstructive sleep apnea (adult) (pediatric): Secondary | ICD-10-CM | POA: Diagnosis not present

## 2021-06-07 ENCOUNTER — Other Ambulatory Visit (HOSPITAL_COMMUNITY): Payer: Self-pay

## 2021-06-11 ENCOUNTER — Other Ambulatory Visit (HOSPITAL_COMMUNITY): Payer: Self-pay

## 2021-06-12 ENCOUNTER — Other Ambulatory Visit (HOSPITAL_COMMUNITY): Payer: Self-pay

## 2021-06-17 ENCOUNTER — Other Ambulatory Visit (HOSPITAL_COMMUNITY): Payer: Self-pay

## 2021-06-17 MED FILL — Atorvastatin Calcium Tab 40 MG (Base Equivalent): ORAL | 90 days supply | Qty: 90 | Fill #1 | Status: AC

## 2021-06-27 ENCOUNTER — Telehealth: Payer: Self-pay

## 2021-06-27 NOTE — Telephone Encounter (Signed)
PA renewal automatically initiated by CMM.  Submitted a Prior Authorization request to [facility] for COSENTYX via CoverMyMeds. Will update once we receive a response.   Key: [id number]

## 2021-07-01 ENCOUNTER — Other Ambulatory Visit (HOSPITAL_COMMUNITY): Payer: Self-pay

## 2021-07-01 NOTE — Telephone Encounter (Signed)
Received notification from [facility] regarding a prior authorization for [place]. Authorization has been APPROVED from 07/10/2021 to 07/09/2022.   Patient can continue to fill through [place]: [phone]   Authorization #  [id number]

## 2021-07-10 ENCOUNTER — Ambulatory Visit: Payer: 59 | Admitting: Rheumatology

## 2021-07-22 ENCOUNTER — Other Ambulatory Visit (HOSPITAL_COMMUNITY): Payer: Self-pay

## 2021-07-23 ENCOUNTER — Other Ambulatory Visit (HOSPITAL_COMMUNITY): Payer: Self-pay

## 2021-07-23 DIAGNOSIS — E785 Hyperlipidemia, unspecified: Secondary | ICD-10-CM | POA: Diagnosis not present

## 2021-07-23 DIAGNOSIS — L4050 Arthropathic psoriasis, unspecified: Secondary | ICD-10-CM | POA: Diagnosis not present

## 2021-07-23 DIAGNOSIS — Z125 Encounter for screening for malignant neoplasm of prostate: Secondary | ICD-10-CM | POA: Diagnosis not present

## 2021-07-27 ENCOUNTER — Other Ambulatory Visit (HOSPITAL_COMMUNITY): Payer: Self-pay

## 2021-07-27 MED FILL — Clopidogrel Bisulfate Tab 75 MG (Base Equiv): ORAL | 90 days supply | Qty: 90 | Fill #1 | Status: AC

## 2021-07-29 ENCOUNTER — Other Ambulatory Visit (HOSPITAL_COMMUNITY): Payer: Self-pay

## 2021-07-29 DIAGNOSIS — R319 Hematuria, unspecified: Secondary | ICD-10-CM | POA: Diagnosis not present

## 2021-07-29 DIAGNOSIS — I2510 Atherosclerotic heart disease of native coronary artery without angina pectoris: Secondary | ICD-10-CM | POA: Diagnosis not present

## 2021-07-29 DIAGNOSIS — Z8611 Personal history of tuberculosis: Secondary | ICD-10-CM | POA: Diagnosis not present

## 2021-07-29 DIAGNOSIS — R82998 Other abnormal findings in urine: Secondary | ICD-10-CM | POA: Diagnosis not present

## 2021-07-29 DIAGNOSIS — M180 Bilateral primary osteoarthritis of first carpometacarpal joints: Secondary | ICD-10-CM | POA: Diagnosis not present

## 2021-07-29 DIAGNOSIS — L4050 Arthropathic psoriasis, unspecified: Secondary | ICD-10-CM | POA: Diagnosis not present

## 2021-07-29 DIAGNOSIS — Z0000 Encounter for general adult medical examination without abnormal findings: Secondary | ICD-10-CM | POA: Diagnosis not present

## 2021-07-29 DIAGNOSIS — D8989 Other specified disorders involving the immune mechanism, not elsewhere classified: Secondary | ICD-10-CM | POA: Diagnosis not present

## 2021-07-29 DIAGNOSIS — Z1212 Encounter for screening for malignant neoplasm of rectum: Secondary | ICD-10-CM | POA: Diagnosis not present

## 2021-07-29 DIAGNOSIS — E785 Hyperlipidemia, unspecified: Secondary | ICD-10-CM | POA: Diagnosis not present

## 2021-07-29 DIAGNOSIS — K30 Functional dyspepsia: Secondary | ICD-10-CM | POA: Diagnosis not present

## 2021-07-31 ENCOUNTER — Encounter: Payer: Self-pay | Admitting: Rheumatology

## 2021-07-31 NOTE — Progress Notes (Signed)
Office Visit Note  Patient: Ricardo [name]             Date of Birth: 1956-08-17           MRN: [id number]             PCP: Shon Baton, MD Referring: Shon Baton, MD Visit Date: 08/14/2021 Occupation: @GUAROCC @  Subjective:  Pain and stiffness in hands.   History of Present Illness: Sailor Haughn is a 65 y.o. male with a history of psoriatic arthritis psoriasis and osteoarthritis.  He states recently has been experiencing some discomfort in his left fifth MCP joint on his left wrist joint.  He states he has been taking Cosentyx every 32 to 34 days.  He had mild psoriasis patch on his left side of forehead and his left leg.  He has occasional discomfort in his TMJ.  He has not noticed any of other joint pain or swelling.  He states he had labs done by [name] recently which showed vitamin D deficiency.  Has been taking vitamin D 2000 units daily.  He was having GI discomfort and had colonoscopy and endoscopy in July which was unremarkable per patient.  Activities of Daily Living:  Patient reports morning stiffness for 2 hours.   Patient Denies nocturnal pain.  Difficulty dressing/grooming: Denies Difficulty climbing stairs: Denies Difficulty getting out of chair: Denies Difficulty using hands for taps, buttons, cutlery, and/or writing: Denies  Review of Systems  Constitutional:  Negative for fatigue.  HENT:  Negative for mouth sores, mouth dryness and nose dryness.   Eyes:  Negative for pain, itching and dryness.  Respiratory:  Negative for shortness of breath and difficulty breathing.   Cardiovascular:  Negative for chest pain and palpitations.  Gastrointestinal:  Negative for blood in stool, constipation and diarrhea.  Endocrine: Negative for increased urination.  Genitourinary:  Negative for difficulty urinating.  Musculoskeletal:  Positive for joint pain, joint pain, joint swelling and morning stiffness. Negative for myalgias, muscle tenderness and myalgias.  Skin:  Negative  for color change, rash and redness.  Allergic/Immunologic: Negative for susceptible to infections.  Neurological:  Negative for dizziness, numbness, headaches, memory loss and weakness.  Hematological:  Negative for bruising/bleeding tendency.  Psychiatric/Behavioral:  Negative for confusion.    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   History of seizures as a child 07/21/2019   Snoring 07/21/2019   History of coronary artery disease 11/19/2018   Current use of anticoagulant therapy 11/19/2018   Psoriasis 11/19/2018   Psoriatic arthritis ([place]) 11/19/2018   High risk medication use 11/19/2018   DDD (degenerative disc disease), cervical 11/19/2018   Encounter for medication review 05/13/2018    Past Medical History:  Diagnosis Date   Coronary heart disease    2 stent placed March 2019   DDD (degenerative disc disease), cervical 11/19/2018   Difficult intubation    pt stated small airway   H/O heart artery stent    History of coronary artery disease 11/19/2018   Status post stent placement March 2019   History of seizures    No seizures in over 20 years.    Psoriasis 11/19/2018   clobetasol   Psoriatic arthritis ([place])    On Cosentyx since 2017    Family History  Problem Relation Age of Onset   Heart disease Father    Past Surgical History:  Procedure Laterality Date   COLONOSCOPY  2007 & 2016   Normal   COLONOSCOPY WITH PROPOFOL N/A 05/07/2021   Procedure: COLONOSCOPY WITH PROPOFOL;  Surgeon: Juanita Craver, MD;  Location: [facility];  Service: Endoscopy;  Laterality: N/A;   CYSTOSCOPY Right 2009   Removal of Ureteric Stone   CYSTOSCOPY  12/2019   ESOPHAGOGASTRODUODENOSCOPY (EGD) WITH PROPOFOL N/A 05/07/2021   Procedure: ESOPHAGOGASTRODUODENOSCOPY (EGD) WITH PROPOFOL;  Surgeon: Juanita Craver, MD;  Location: [facility];  Service: Endoscopy;  Laterality: N/A;   HEMORRHOID SURGERY  2004    External hemorrhoid removed    HERNIA REPAIR     INGUINAL HERNIA REPAIR  1976   PILONIDAL CYST / SINUS EXCISION  2001   THORACOTOMY  1997   and biopsy   UPPER GI ENDOSCOPY  2007 & 2016   Normal   Social History   Social History Narrative   ** Merged History Encounter **       Immunization History  Administered Date(s) Administered   PFIZER(Purple Top)SARS-COV-2 Vaccination 10/23/2019, 11/14/2019, 07/13/2020, 07/21/2021     Objective: Vital Signs: BP 135/78 (BP Location: Left Arm, Patient Position: Sitting, Cuff Size: Normal)   Pulse 73   Ht 5\' 9"  (1.753 m)   Wt 160 lb (72.6 kg)   BMI 23.63 kg/m    Physical Exam Vitals and nursing note reviewed.  Constitutional:      Appearance: He is well-developed.  HENT:     Head: Normocephalic and atraumatic.  Eyes:     Conjunctiva/sclera: Conjunctivae normal.     Pupils: Pupils are equal, round, and reactive to light.  Cardiovascular:     Rate and Rhythm: Normal rate and regular rhythm.     Heart sounds: Normal heart sounds.  Pulmonary:     Effort: Pulmonary effort is normal.     Breath sounds: Normal breath sounds.  Abdominal:     General: Bowel sounds are normal.     Palpations: Abdomen is soft.  Musculoskeletal:     Cervical back: Normal range of motion and neck supple.  Skin:    General: Skin is warm and dry.     Capillary Refill: Capillary refill takes less than 2 seconds.  Neurological:     Mental Status: He is alert and oriented to person, place, and time.  Psychiatric:        Behavior: Behavior normal.     Musculoskeletal Exam: C-spine thoracic and lumbar spine were in good range of motion.  He had no SI joint tenderness.  Shoulder joints, elbow joints, wrist joints, MCPs PIPs and DIPs with good range of motion.  He had tenderness on palpation of his left fifth MCP joint and wrist joint.  No synovitis was noted.  Hip joints, knee joints, ankles, MTPs and PIPs with good range of motion with no synovitis.  CDAI  Exam: CDAI Score: 2.6  Patient Global: 3 mm; Provider Global: 3 mm Swollen: 0 ; Tender: 2  Joint Exam 08/14/2021      Right  Left  Wrist      Tender  MCP 5      Tender     Investigation: No additional findings.  Imaging: No results found.  Recent Labs: Lab Results  Component Value Date   WBC 6.6 05/10/2021   HGB 13.5 05/10/2021   PLT 247 05/10/2021   NA 141 05/10/2021   K 4.4 05/10/2021   CL 102 05/10/2021   CO2 32 05/10/2021   GLUCOSE 82 05/10/2021   BUN 12 05/10/2021   CREATININE 0.80 05/10/2021  BILITOT 0.4 05/10/2021   ALKPHOS 79 01/13/2020   AST 20 05/10/2021   ALT 19 05/10/2021   PROT 6.9 05/10/2021   ALBUMIN 4.4 01/13/2020   CALCIUM 9.4 05/10/2021   GFRAA 109 05/10/2021   QFTBGOLDPLUS NEGATIVE 05/10/2021   07/23/2021 CBC normal, CMP normal, PSA normal, TSH normal, vitamin D 19.8 at Dr. Keane Police labs  Speciality Comments: No specialty comments available.  Procedures:  No procedures performed Allergies: Patient has no known allergies.   Assessment / Plan:     Visit Diagnoses: Psoriatic arthritis ([place]) - diagnosed in 1999, treated by Dr. Kerry Hough in [place] -he had inadequate response to methotrexate, Enbrel, Humira.  -He is doing much better on Cosentyx.  He states he has been taking Cosentyx every 32 to 34 days.  He has been experiencing some discomfort in his left fifth MCP joint tenderness left wrist joint.  He also has intermittent TMJ discomfort.  He has a small psoriasis patch over his forehead.  I did detailed discussion with [name] regarding taking his medication every 28 days.  Other option could be to take Cosentyx injections 150 mg every other week.  At this point he would like to take Cosentyx on more regular basis at 28 days.  I also discussed possible use of Taltz if he continues to have discomfort with Cosentyx.  I will check sedimentation rate with the next labs.  He denies any history of uveitis, Achilles tendinitis or plantar fasciitis.   Plan: Sedimentation rate  Psoriasis -he is having a mild flare of psoriasis.  Prescription for prednisone was called in.  Plan: clobetasol cream (TEMOVATE) 0.05 %  High risk medication use - Cosentyx 300 mg every 28 days.  His labs are stable.  He had labs sent Dr. Keane Police office in September which was within normal limits.  He has been advised to get labs every 3 months.  He was also advised to hold Cosentyx in case he develops an infection.  He states that immunization is up-to-date.  I placed information in the AVS.  Arthritis of carpometacarpal [facility]) joint of right thumb-is off-and-on discomfort in the right CMC joint.  He uses ultrasound at work.  Current use of anticoagulant therapy-he is on Plavix.  History of coronary artery disease - Status post stent placement March 2019.  Gastroesophageal reflux disease with esophagitis without hemorrhage - EGD and colonoscopy were negative in 07/05/ 2022.  Obstructive sleep apnea syndrome - on CPAP  History of vitamin D deficiency - On vitamin D 2000 units p.o. daily -his vitamin D level was 19.8 on July 23, 2021.  I decided to start him on vitamin D 50,000 units once a week for 3 months and then he can maintain with vitamin D 2000 units daily.  We will check levels after 3 months.  Plan: VITAMIN D 25 Hydroxy (Vit-D Deficiency, Fractures)  Orders: Orders Placed This Encounter  Procedures   Sedimentation rate   VITAMIN D 25 Hydroxy (Vit-D Deficiency, Fractures)    Meds ordered this encounter  Medications   clobetasol cream (TEMOVATE) 0.05 %    Sig: Apply 1 application topically 2 (two) times daily.    Dispense:  45 g    Refill:  1   diclofenac Sodium (VOLTAREN) 1 % GEL    Sig: Apply 2-4 grams to affected joint 4 times daily as needed.    Dispense:  400 g    Refill:  2    Face-to-face time spent with patient was placing wound minutes.  Greater than 50% of time was spent in counseling and coordination of care.  Follow-Up  Instructions: Return in about 5 months (around 01/12/2022) for Psoriatic arthritis.   Bo Merino, MD  Note - This record has been created using Editor, commissioning.  Chart creation errors have been sought, but may not always  have been located. Such creation errors do not reflect on  the standard of medical care.

## 2021-08-14 ENCOUNTER — Other Ambulatory Visit (HOSPITAL_COMMUNITY): Payer: Self-pay

## 2021-08-14 ENCOUNTER — Ambulatory Visit (INDEPENDENT_AMBULATORY_CARE_PROVIDER_SITE_OTHER): Payer: 59 | Admitting: Rheumatology

## 2021-08-14 ENCOUNTER — Other Ambulatory Visit: Payer: Self-pay

## 2021-08-14 ENCOUNTER — Encounter: Payer: Self-pay | Admitting: Rheumatology

## 2021-08-14 VITALS — BP 135/78 | HR 73 | Ht 69.0 in | Wt 160.0 lb

## 2021-08-14 DIAGNOSIS — M1811 Unilateral primary osteoarthritis of first carpometacarpal joint, right hand: Secondary | ICD-10-CM | POA: Diagnosis not present

## 2021-08-14 DIAGNOSIS — H5789 Other specified disorders of eye and adnexa: Secondary | ICD-10-CM

## 2021-08-14 DIAGNOSIS — L4050 Arthropathic psoriasis, unspecified: Secondary | ICD-10-CM

## 2021-08-14 DIAGNOSIS — Z7901 Long term (current) use of anticoagulants: Secondary | ICD-10-CM

## 2021-08-14 DIAGNOSIS — Z8639 Personal history of other endocrine, nutritional and metabolic disease: Secondary | ICD-10-CM | POA: Diagnosis not present

## 2021-08-14 DIAGNOSIS — Z8679 Personal history of other diseases of the circulatory system: Secondary | ICD-10-CM

## 2021-08-14 DIAGNOSIS — Z79899 Other long term (current) drug therapy: Secondary | ICD-10-CM

## 2021-08-14 DIAGNOSIS — L409 Psoriasis, unspecified: Secondary | ICD-10-CM | POA: Diagnosis not present

## 2021-08-14 DIAGNOSIS — K2100 Gastro-esophageal reflux disease with esophagitis, without bleeding: Secondary | ICD-10-CM | POA: Diagnosis not present

## 2021-08-14 DIAGNOSIS — G4733 Obstructive sleep apnea (adult) (pediatric): Secondary | ICD-10-CM | POA: Diagnosis not present

## 2021-08-14 DIAGNOSIS — M25441 Effusion, right hand: Secondary | ICD-10-CM

## 2021-08-14 MED ORDER — DICLOFENAC SODIUM 1 % EX GEL
CUTANEOUS | 2 refills | Status: AC
  Filled 2021-08-14: qty 400, 25d supply, fill #0

## 2021-08-14 MED ORDER — VITAMIN D (ERGOCALCIFEROL) 1.25 MG (50000 UNIT) PO CAPS
50000.0000 [IU] | ORAL_CAPSULE | ORAL | 0 refills | Status: DC
  Filled 2021-08-14: qty 12, 84d supply, fill #0

## 2021-08-14 MED ORDER — CLOBETASOL PROPIONATE 0.05 % EX CREA
1.0000 "application " | TOPICAL_CREAM | Freq: Two times a day (BID) | CUTANEOUS | 1 refills | Status: DC
  Filled 2021-08-14: qty 45, 30d supply, fill #0

## 2021-08-14 NOTE — Patient Instructions (Addendum)
Standing Labs We placed an order today for your standing lab work.   Please have your standing labs drawn in December and every 3 months  If possible, please have your labs drawn 2 weeks prior to your appointment so that the provider can discuss your results at your appointment.  Please note that you may see your imaging and lab results in [place] before we have reviewed them. We may be awaiting multiple results to interpret others before contacting you. Please allow our office up to 72 hours to thoroughly review all of the results before contacting the office for clarification of your results.  We have open lab daily: Monday through Thursday from 1:30-4:30 PM and Friday from 1:30-4:00 PM at the office of Dr. Bo Merino, [facility].   Please be advised, all patients with office appointments requiring lab work will take precedent over walk-in lab work.  If possible, please come for your lab work on Monday and Friday afternoons, as you may experience shorter wait times. The office is located at [address] [place], [place], [place] [address] No appointment is necessary.   Labs are drawn by [facility]. Please bring your co-pay at the time of your lab draw.  You may receive a bill from [facility] for your lab work.  If you wish to have your labs drawn at another location, please call the office 24 hours in advance to send orders.  If you have any questions regarding directions or hours of operation,  please call [phone].   As a reminder, please drink plenty of water prior to coming for your lab work. Thanks!   Vaccines You are taking a medication(s) that can suppress your immune system.  The following immunizations are recommended: Flu annually Covid-19  Td/Tdap (tetanus, diphtheria, pertussis) every 10 years Pneumonia (Prevnar 15 then Pneumovax 23 at least 1 year apart.  Alternatively, can take Prevnar 20 without needing additional dose) Shingrix: 2 doses from 4  weeks to 6 months apart  Please check with your PCP to make sure you are up to date.   If you have signs or symptoms of an infection or start antibiotics: First, call your PCP for workup of your infection. Hold your medication through the infection, until you complete your antibiotics, and until symptoms resolve if you take the following: Injectable medication (Actemra, Benlysta, Cimzia, Cosentyx, Enbrel, Humira, Kevzara, Orencia, Remicade, Simponi, Stelara, Taltz, Tremfya) Methotrexate Leflunomide (Arava) Mycophenolate (Cellcept) Morrie Sheldon, Olumiant, or Rinvoq

## 2021-08-15 ENCOUNTER — Other Ambulatory Visit (HOSPITAL_COMMUNITY): Payer: Self-pay

## 2021-08-15 MED FILL — Metoprolol Succinate Tab ER 24HR 25 MG (Tartrate Equiv): ORAL | 90 days supply | Qty: 45 | Fill #2 | Status: AC

## 2021-08-16 DIAGNOSIS — G4733 Obstructive sleep apnea (adult) (pediatric): Secondary | ICD-10-CM | POA: Diagnosis not present

## 2021-08-17 ENCOUNTER — Other Ambulatory Visit (HOSPITAL_COMMUNITY): Payer: Self-pay

## 2021-08-23 ENCOUNTER — Other Ambulatory Visit (HOSPITAL_COMMUNITY): Payer: Self-pay

## 2021-08-26 ENCOUNTER — Other Ambulatory Visit (HOSPITAL_COMMUNITY): Payer: Self-pay

## 2021-08-27 ENCOUNTER — Other Ambulatory Visit (HOSPITAL_COMMUNITY): Payer: Self-pay

## 2021-08-30 ENCOUNTER — Other Ambulatory Visit (HOSPITAL_COMMUNITY): Payer: Self-pay

## 2021-09-08 MED FILL — Atorvastatin Calcium Tab 40 MG (Base Equivalent): ORAL | 90 days supply | Qty: 90 | Fill #2 | Status: AC

## 2021-09-09 ENCOUNTER — Other Ambulatory Visit (HOSPITAL_COMMUNITY): Payer: Self-pay

## 2021-09-19 ENCOUNTER — Other Ambulatory Visit (HOSPITAL_COMMUNITY): Payer: Self-pay

## 2021-09-19 ENCOUNTER — Other Ambulatory Visit: Payer: Self-pay | Admitting: Physician Assistant

## 2021-09-19 MED ORDER — COSENTYX SENSOREADY (300 MG) 150 MG/ML ~~LOC~~ SOAJ
SUBCUTANEOUS | 2 refills | Status: DC
  Filled 2021-09-19: qty 2, fill #0

## 2021-09-19 NOTE — Telephone Encounter (Signed)
Next Visit: 02/05/2022  Last Visit: 08/14/2021  Last Fill: 05/17/2021  OV:PCHEKBTCY arthritis   Current Dose per office note 08/14/2021: Cosentyx 300 mg every 28 days  Labs: 05/10/2021 CBC and CMP are normal. Per office note on 08/14/2021: He had labs sent Dr. Keane Police office in September which was within normal limits.   TB Gold: 05/10/2021 Neg    Okay to refill Cosentyx? ?

## 2021-09-20 ENCOUNTER — Other Ambulatory Visit (HOSPITAL_COMMUNITY): Payer: Self-pay

## 2021-09-20 ENCOUNTER — Other Ambulatory Visit: Payer: Self-pay | Admitting: Pharmacist

## 2021-09-20 MED ORDER — COSENTYX SENSOREADY (300 MG) 150 MG/ML ~~LOC~~ SOAJ
SUBCUTANEOUS | 2 refills | Status: DC
  Filled 2021-09-20: qty 2, 28d supply, fill #0
  Filled 2021-10-17: qty 2, 28d supply, fill #1
  Filled 2021-11-14: qty 2, 28d supply, fill #2

## 2021-09-23 ENCOUNTER — Other Ambulatory Visit (HOSPITAL_COMMUNITY): Payer: Self-pay

## 2021-10-02 ENCOUNTER — Ambulatory Visit: Payer: 59 | Admitting: Neurology

## 2021-10-02 ENCOUNTER — Other Ambulatory Visit: Payer: Self-pay | Admitting: Interventional Cardiology

## 2021-10-02 ENCOUNTER — Telehealth: Payer: Self-pay | Admitting: Neurology

## 2021-10-02 MED ORDER — CLOPIDOGREL BISULFATE 75 MG PO TABS
75.0000 mg | ORAL_TABLET | Freq: Every day | ORAL | 0 refills | Status: DC
  Filled 2021-10-21: qty 90, 90d supply, fill #0

## 2021-10-02 NOTE — Telephone Encounter (Signed)
MD out sick- LVM & sent mychart msg asking pt to call us back to r/s appt.

## 2021-10-15 DIAGNOSIS — Z85828 Personal history of other malignant neoplasm of skin: Secondary | ICD-10-CM | POA: Diagnosis not present

## 2021-10-15 DIAGNOSIS — N481 Balanitis: Secondary | ICD-10-CM | POA: Diagnosis not present

## 2021-10-15 DIAGNOSIS — L570 Actinic keratosis: Secondary | ICD-10-CM | POA: Diagnosis not present

## 2021-10-15 DIAGNOSIS — L905 Scar conditions and fibrosis of skin: Secondary | ICD-10-CM | POA: Diagnosis not present

## 2021-10-15 DIAGNOSIS — D1801 Hemangioma of skin and subcutaneous tissue: Secondary | ICD-10-CM | POA: Diagnosis not present

## 2021-10-17 ENCOUNTER — Other Ambulatory Visit (HOSPITAL_COMMUNITY): Payer: Self-pay

## 2021-10-21 ENCOUNTER — Ambulatory Visit: Payer: 59 | Attending: Family Medicine | Admitting: Pharmacist

## 2021-10-21 ENCOUNTER — Other Ambulatory Visit: Payer: Self-pay

## 2021-10-21 ENCOUNTER — Other Ambulatory Visit (HOSPITAL_COMMUNITY): Payer: Self-pay

## 2021-10-21 DIAGNOSIS — Z79899 Other long term (current) drug therapy: Secondary | ICD-10-CM

## 2021-10-21 NOTE — Progress Notes (Addendum)
° °  S: Patient presents today to the Patient [name] for review of their specialty medication.   Patient is currently taking Cosentyx for psoriatic arthritis. Patient is managed by Dr. Estanislado Pandy  Adherence: denies any missed doses  Efficacy: working well for him, no adverse effects  Monitoring/Current adverse effects: S/sx of infection: denies GI upset: denies S/sx of hypersensitivity: denies  O:     Lab Results  Component Value Date   WBC 6.6 05/10/2021   HGB 13.5 05/10/2021   HCT 40.3 05/10/2021   MCV 91.2 05/10/2021   PLT 247 05/10/2021      Chemistry      Component Value Date/Time   NA 141 05/10/2021 1355   NA 140 01/13/2020 1211   K 4.4 05/10/2021 1355   CL 102 05/10/2021 1355   CO2 32 05/10/2021 1355   BUN 12 05/10/2021 1355   BUN 15 01/13/2020 1211   CREATININE 0.80 05/10/2021 1355      Component Value Date/Time   CALCIUM 9.4 05/10/2021 1355   ALKPHOS 79 01/13/2020 1211   AST 20 05/10/2021 1355   ALT 19 05/10/2021 1355   BILITOT 0.4 05/10/2021 1355   BILITOT 0.2 01/13/2020 1211       A/P: 1. Medication review: Patient is on Cosentyx for psoriatic arthritis and is tolerating it well with no adverse effects. Reviewed the medication with him, including the following: Cosentyx an antibody used to treat inflammatory diseases, such as psoriasis and psoriatic arthritis. While on the medication, the patient is at an increased risk of infection. Patient should let rheumatologist know of any s/sx of infection. Possible adverse effects include hypersensitivity reactions, infection, and GI upset. Patient aware of appropriate injection technique.  No recommendation for any changes.  Ricardo [name], PharmD, Ricardo [name], [place] [phone]

## 2021-11-12 ENCOUNTER — Encounter: Payer: Self-pay | Admitting: Rheumatology

## 2021-11-12 DIAGNOSIS — R7303 Prediabetes: Secondary | ICD-10-CM

## 2021-11-14 ENCOUNTER — Other Ambulatory Visit (HOSPITAL_COMMUNITY): Payer: Self-pay

## 2021-11-15 ENCOUNTER — Other Ambulatory Visit (HOSPITAL_COMMUNITY): Payer: Self-pay

## 2021-11-15 MED FILL — Metoprolol Succinate Tab ER 24HR 25 MG (Tartrate Equiv): ORAL | 90 days supply | Qty: 45 | Fill #3 | Status: AC

## 2021-11-18 ENCOUNTER — Encounter: Payer: Self-pay | Admitting: Interventional Cardiology

## 2021-11-19 ENCOUNTER — Other Ambulatory Visit (HOSPITAL_COMMUNITY): Payer: Self-pay

## 2021-11-19 NOTE — Progress Notes (Signed)
Cardiology Office Note   Date:  11/20/2021   ID:  Ricardo [name], DOB [birth year], MRN [id number]  PCP:  Shon Baton, MD    Chief Complaint  Patient presents with   Follow-up   CAD  Wt Readings from Last 3 Encounters:  11/20/21 152 lb (68.9 kg)  08/14/21 160 lb (72.6 kg)  05/07/21 156 lb 1.4 oz (70.8 kg)       History of Present Illness: Ricardo [name] is a 66 y.o. male   CAD.  Angina was an exertional chest fullness.  He had basal cell CA on the face several years ago.   He had exertional angina in early 2019, it was a fullness in his chest that was worse with walking up hills.   Father had CABG at 101.   He had He had an abnormal stress which led to cath.     CABG was considered.  CTO of LAD and severe disease of OM.  Both were stented via the left radial in Feb 2019.  He asked for the left radial to be used.    3.0 x 38 Sierra to LAD postdilated to 3.25 mm. 3.5 x 18 to OM postdilated to 5.0 mm   He has done well since then. He continues to exercise regularly.   He was switched to Plavix only in 02/2019.  Sleep study was ordered.  He did have apnea, and now uses CPAP.  He eats a vegetarian diet.    SHOB got better off of Brilinta. He added aspirin back, but stopped it after some stomach upset.  Has had gastric erosions in the past.   Since the last visit, 99% vegan diet.  Occasional eggs when he travels.   Denies : Chest pain. Dizziness. Leg edema. Nitroglycerin use. Orthopnea. Palpitations. Paroxysmal nocturnal dyspnea. Shortness of breath. Syncope.    Exercising 2-3x/week on the treadmill.  Climbs stairs.  No sx.      Past Medical History:  Diagnosis Date   Coronary heart disease    2 stent placed March 2019   DDD (degenerative disc disease), cervical 11/19/2018   Difficult intubation    pt stated small airway   H/O heart artery stent    History of coronary artery disease 11/19/2018   Status post stent placement March 2019   History of seizures    No  seizures in over 20 years.    Psoriasis 11/19/2018   clobetasol   Psoriatic arthritis ([place])    On Cosentyx since 2017    Past Surgical History:  Procedure Laterality Date   COLONOSCOPY  2007 & 2016   Normal   COLONOSCOPY WITH PROPOFOL N/A 05/07/2021   Procedure: COLONOSCOPY WITH PROPOFOL;  Surgeon: Juanita Craver, MD;  Location: [facility];  Service: Endoscopy;  Laterality: N/A;   CYSTOSCOPY Right 2009   Removal of Ureteric Stone   CYSTOSCOPY  12/2019   ESOPHAGOGASTRODUODENOSCOPY (EGD) WITH PROPOFOL N/A 05/07/2021   Procedure: ESOPHAGOGASTRODUODENOSCOPY (EGD) WITH PROPOFOL;  Surgeon: Juanita Craver, MD;  Location: [facility];  Service: Endoscopy;  Laterality: N/A;   HEMORRHOID SURGERY  2004   External hemorrhoid removed    HERNIA REPAIR     INGUINAL HERNIA REPAIR  1976   PILONIDAL CYST / SINUS EXCISION  2001   THORACOTOMY  1997   and biopsy   UPPER GI ENDOSCOPY  2007 & 2016   Normal     Current Outpatient Medications  Medication Sig Dispense Refill   atorvastatin (LIPITOR) 40 MG tablet TAKE 1  TABLET BY MOUTH ONCE A DAY (Patient taking differently: Take 40 mg by mouth daily.) 90 tablet 2   clobetasol cream (TEMOVATE) 3.35 % Apply 1 application topically 2 (two) times daily. 45 g 1   clopidogrel (PLAVIX) 75 MG tablet Take 1 tablet (75 mg total) by mouth daily. Please keep upcoming appt in January 2023 with [name] before anymore refills. Thank you 90 tablet 0   Coenzyme Q10 200 MG capsule Take 200 mg by mouth daily.      COSENTYX SENSOREADY, 300 MG, 150 MG/ML SOAJ INJECT 300MG  INTO THE SKIN EVERY 28 DAYS. 2 mL 2   Cyanocobalamin (VITAMIN B12) 1000 MCG TBCR Take 1,000 mg by mouth 2 (two) times a week. Monday- Thursday     diclofenac Sodium (VOLTAREN) 1 % GEL Apply 2-4 grams to affected joint 4 times daily as needed. 400 g 2   loratadine (CLARITIN) 10 MG tablet Take 10 mg by mouth daily as needed for allergies.     metoprolol succinate (TOPROL-XL) 25 MG 24 hr tablet TAKE 1/2  TABLET (12.5 MG TOTAL) BY MOUTH ONCE A DAY (Patient taking differently: Take 12.5 mg by mouth daily.) 90 tablet 3   Multiple Vitamin (MULTIVITAMIN PO) Take 1 tablet by mouth daily.     nitroGLYCERIN (NITROSTAT) 0.4 MG SL tablet Place 1 tablet (0.4 mg total) under the tongue every 5 (five) minutes as needed for chest pain. 25 tablet 3   Vitamin D, Ergocalciferol, (DRISDOL) 1.25 MG (50000 UNIT) CAPS capsule Take 1 capsule (50,000 Units total) by mouth every 7 (seven) days. 12 capsule 0   No current facility-administered medications for this visit.    Allergies:   Patient has no known allergies.    Social History:  The patient  reports that he quit smoking about 30 years ago. His smoking use included cigarettes. He has never used smokeless tobacco. He reports current alcohol use. He reports that he does not use drugs.   Family History:  The patient's family history includes Heart disease in his father.    ROS:  Please see the history of present illness.   Otherwise, review of systems are positive for decreasing medicine.   All other systems are reviewed and negative.    PHYSICAL EXAM: VS:  BP 110/76    Pulse 68    Ht 5\' 9"  (1.753 m)    Wt 152 lb (68.9 kg)    SpO2 98%    BMI 22.45 kg/m  , BMI Body mass index is 22.45 kg/m. GEN: Well nourished, well developed, in no acute distress HEENT: normal Neck: no JVD, carotid bruits, or masses Cardiac: RRR; no murmurs, rubs, or gallops,no edema  Respiratory:  clear to auscultation bilaterally, normal work of breathing GI: soft, nontender, nondistended, + BS MS: no deformity or atrophy Skin: warm and dry, no rash Neuro:  Strength and sensation are intact Psych: euthymic mood, full affect   EKG:   The ekg ordered today demonstrates NSR, no ST changes   Recent Labs: 05/10/2021: ALT 19; BUN 12; Creat 0.80; Hemoglobin 13.5; Platelets 247; Potassium 4.4; Sodium 141   Lipid Panel    Component Value Date/Time   CHOL 102 10/06/2019 0803   TRIG  107 10/06/2019 0803   HDL 37 (L) 10/06/2019 0803   CHOLHDL 2.8 10/06/2019 0803   LDLCALC 45 10/06/2019 0803     Other studies Reviewed: Additional studies/ records that were reviewed today with results demonstrating: labs reviewed.   ASSESSMENT AND PLAN:  CAD: Continue  clopidogrel monotherapy.  No angina.  We discussed stress testing.  At this point, we will hold off.  No symptoms.  He is trying to get back to his routine activities that he was doing for exercise prior to the holidays. Family history of early CAD: Brother with CAD. Hyperlipidemia: LDL 51 in 07/2021.  The current medical regimen is effective;  continue present plan and medications. Psoriatic arthritis: Followed by Dr. Estanislado Pandy.  Somewhat Controlled but may need med change.  OSA: On CPAP.  Followed by Dr. Brett Fairy. Aortic atherosclerosis: Noted on ultrasound in 2020. Former smoker.  No aortic aneurysm on prior testing.  At some point, he will need a repeat screening after turning 65.  He prefers to wait. PreDM: 5.8 A1C in 01/2021.  Whole food, plant-based diet.  Continue high fiber intake.   Current medicines are reviewed at length with the patient today.  The patient concerns regarding his medicines were addressed.  The following changes have been made:  No change  Labs/ tests ordered today include:  No orders of the defined types were placed in this encounter.   Recommend 150 minutes/week of aerobic exercise Low fat, low carb, high fiber diet recommended  Disposition:   FU in 1 year or sooner if symptoms arise.   Signed, Larae Grooms, MD  11/20/2021 9:32 AM    [address], [place], [place]  [address] Phone: [phone]; Fax: [phone]

## 2021-11-20 ENCOUNTER — Encounter: Payer: Self-pay | Admitting: Interventional Cardiology

## 2021-11-20 ENCOUNTER — Other Ambulatory Visit (HOSPITAL_COMMUNITY): Payer: Self-pay

## 2021-11-20 ENCOUNTER — Other Ambulatory Visit: Payer: Self-pay

## 2021-11-20 ENCOUNTER — Ambulatory Visit (INDEPENDENT_AMBULATORY_CARE_PROVIDER_SITE_OTHER): Payer: 59 | Admitting: Interventional Cardiology

## 2021-11-20 VITALS — BP 110/76 | HR 68 | Ht 69.0 in | Wt 152.0 lb

## 2021-11-20 DIAGNOSIS — Z87891 Personal history of nicotine dependence: Secondary | ICD-10-CM | POA: Diagnosis not present

## 2021-11-20 DIAGNOSIS — G4733 Obstructive sleep apnea (adult) (pediatric): Secondary | ICD-10-CM | POA: Diagnosis not present

## 2021-11-20 DIAGNOSIS — L4050 Arthropathic psoriasis, unspecified: Secondary | ICD-10-CM | POA: Diagnosis not present

## 2021-11-20 DIAGNOSIS — Z8249 Family history of ischemic heart disease and other diseases of the circulatory system: Secondary | ICD-10-CM | POA: Diagnosis not present

## 2021-11-20 DIAGNOSIS — E782 Mixed hyperlipidemia: Secondary | ICD-10-CM

## 2021-11-20 DIAGNOSIS — R7303 Prediabetes: Secondary | ICD-10-CM

## 2021-11-20 DIAGNOSIS — I700 Atherosclerosis of aorta: Secondary | ICD-10-CM | POA: Diagnosis not present

## 2021-11-20 DIAGNOSIS — I2510 Atherosclerotic heart disease of native coronary artery without angina pectoris: Secondary | ICD-10-CM

## 2021-11-20 MED ORDER — NITROGLYCERIN 0.4 MG SL SUBL
0.4000 mg | SUBLINGUAL_TABLET | SUBLINGUAL | 3 refills | Status: DC | PRN
  Filled 2021-11-20: qty 25, 10d supply, fill #0
  Filled 2022-03-16: qty 25, 10d supply, fill #1

## 2021-11-20 NOTE — Addendum Note (Signed)
Addended by: Thompson Grayer on: 11/20/2021 10:43 AM   Modules accepted: Orders

## 2021-11-20 NOTE — Patient Instructions (Signed)
Medication Instructions:  Your physician recommends that you continue on your current medications as directed. Please refer to the Current Medication list given to you today.  *If you need a refill on your cardiac medications before your next appointment, please call your pharmacy*   Lab Work: Have A1C checked when you have upcoming lab work done If you have labs (blood work) drawn today and your tests are completely normal, you will receive your results only by: [place] (if you have MyChart) OR A paper copy in the mail If you have any lab test that is abnormal or we need to change your treatment, we will call you to review the results.   Testing/Procedures: none   Follow-Up: At [facility], you and your health needs are our priority.  As part of our continuing mission to provide you with exceptional heart care, we have created designated Provider Care Teams.  These Care Teams include your primary Cardiologist (physician) and Advanced Practice Providers (APPs -  Physician Assistants and Nurse Practitioners) who all work together to provide you with the care you need, when you need it.  We recommend signing up for the patient portal called "MyChart".  Sign up information is provided on this After Visit Summary.  MyChart is used to connect with patients for Virtual Visits (Telemedicine).  Patients are able to view lab/test results, encounter notes, upcoming appointments, etc.  Non-urgent messages can be sent to your provider as well.   To learn more about what you can do with MyChart, go to [email or web address].    Your next appointment:   12 month(s)  The format for your next appointment:   In Person  Provider:   Larae Grooms, MD     Other Instructions

## 2021-11-22 ENCOUNTER — Other Ambulatory Visit: Payer: Self-pay

## 2021-11-22 DIAGNOSIS — R7303 Prediabetes: Secondary | ICD-10-CM | POA: Diagnosis not present

## 2021-11-22 DIAGNOSIS — L4050 Arthropathic psoriasis, unspecified: Secondary | ICD-10-CM

## 2021-11-22 DIAGNOSIS — Z79899 Other long term (current) drug therapy: Secondary | ICD-10-CM

## 2021-11-22 DIAGNOSIS — Z8639 Personal history of other endocrine, nutritional and metabolic disease: Secondary | ICD-10-CM

## 2021-11-23 LAB — CBC WITH DIFFERENTIAL/PLATELET
Absolute Monocytes: 635 cells/uL (ref 200–950)
Basophils Absolute: 87 cells/uL (ref 0–200)
Basophils Relative: 1 %
Eosinophils Absolute: 392 cells/uL (ref 15–500)
Eosinophils Relative: 4.5 %
HCT: 40.8 % (ref 38.5–50.0)
Hemoglobin: 13.6 g/dL (ref 13.2–17.1)
Lymphs Abs: 1958 cells/uL (ref 850–3900)
MCH: 30.7 pg (ref 27.0–33.0)
MCHC: 33.3 g/dL (ref 32.0–36.0)
MCV: 92.1 fL (ref 80.0–100.0)
MPV: 11.4 fL (ref 7.5–12.5)
Monocytes Relative: 7.3 %
Neutro Abs: 5629 cells/uL (ref 1500–7800)
Neutrophils Relative %: 64.7 %
Platelets: 231 10*3/uL (ref 140–400)
RBC: 4.43 10*6/uL (ref 4.20–5.80)
RDW: 11.8 % (ref 11.0–15.0)
Total Lymphocyte: 22.5 %
WBC: 8.7 10*3/uL (ref 3.8–10.8)

## 2021-11-23 LAB — COMPLETE METABOLIC PANEL WITH GFR
AG Ratio: 1.6 (calc) (ref 1.0–2.5)
ALT: 17 U/L (ref 9–46)
AST: 19 U/L (ref 10–35)
Albumin: 4.5 g/dL (ref 3.6–5.1)
Alkaline phosphatase (APISO): 75 U/L (ref 35–144)
BUN: 13 mg/dL (ref 7–25)
CO2: 35 mmol/L — ABNORMAL HIGH (ref 20–32)
Calcium: 9.5 mg/dL (ref 8.6–10.3)
Chloride: 104 mmol/L (ref 98–110)
Creat: 0.78 mg/dL (ref 0.70–1.35)
Globulin: 2.9 g/dL (calc) (ref 1.9–3.7)
Glucose, Bld: 73 mg/dL (ref 65–99)
Potassium: 4.4 mmol/L (ref 3.5–5.3)
Sodium: 143 mmol/L (ref 135–146)
Total Bilirubin: 0.3 mg/dL (ref 0.2–1.2)
Total Protein: 7.4 g/dL (ref 6.1–8.1)
eGFR: 99 mL/min/{1.73_m2} (ref >=60)

## 2021-11-23 LAB — HEMOGLOBIN A1C
Hgb A1c MFr Bld: 5.7 % of total Hgb — ABNORMAL HIGH (ref <5.7)
Mean Plasma Glucose: 117 mg/dL
eAG (mmol/L): 6.5 mmol/L

## 2021-11-23 LAB — SEDIMENTATION RATE: Sed Rate: 17 mm/h (ref 0–20)

## 2021-11-23 LAB — VITAMIN D 25 HYDROXY (VIT D DEFICIENCY, FRACTURES): Vit D, 25-Hydroxy: 48 ng/mL (ref 30–100)

## 2021-11-24 NOTE — Progress Notes (Signed)
Hb A1c is borderline elevated.CBC, CMP, and Vit D are normal.

## 2021-12-08 ENCOUNTER — Other Ambulatory Visit: Payer: Self-pay | Admitting: Interventional Cardiology

## 2021-12-10 ENCOUNTER — Other Ambulatory Visit: Payer: Self-pay | Admitting: Physician Assistant

## 2021-12-10 ENCOUNTER — Other Ambulatory Visit (HOSPITAL_COMMUNITY): Payer: Self-pay

## 2021-12-10 ENCOUNTER — Other Ambulatory Visit: Payer: Self-pay | Admitting: Pharmacist

## 2021-12-10 MED ORDER — ATORVASTATIN CALCIUM 40 MG PO TABS
ORAL_TABLET | Freq: Every day | ORAL | 3 refills | Status: DC
  Filled 2021-12-10: qty 90, 90d supply, fill #0
  Filled 2022-03-16: qty 90, 90d supply, fill #1
  Filled 2022-06-21: qty 90, 90d supply, fill #2
  Filled 2022-09-21: qty 90, 90d supply, fill #3

## 2021-12-10 MED ORDER — COSENTYX SENSOREADY (300 MG) 150 MG/ML ~~LOC~~ SOAJ
SUBCUTANEOUS | 2 refills | Status: DC
  Filled 2021-12-10: qty 2, fill #0

## 2021-12-10 MED ORDER — COSENTYX SENSOREADY (300 MG) 150 MG/ML ~~LOC~~ SOAJ
SUBCUTANEOUS | 2 refills | Status: DC
  Filled 2021-12-10: qty 2, 28d supply, fill #0
  Filled 2022-01-07: qty 2, 28d supply, fill #1

## 2021-12-10 NOTE — Telephone Encounter (Signed)
Next Visit: 02/05/2022  Last Visit: 08/14/2021  Last Fill: 09/20/2021  KY:[id number] arthritis   Current Dose per office note 08/14/2021: Cosentyx 300 mg every 28 days  Labs: 11/22/2021 CBC, CMP, and Vit D are normal.  TB Gold: 05/10/2021 Neg    Okay to refill Cosentyx?

## 2021-12-11 ENCOUNTER — Other Ambulatory Visit (HOSPITAL_COMMUNITY): Payer: Self-pay

## 2022-01-07 ENCOUNTER — Other Ambulatory Visit (HOSPITAL_COMMUNITY): Payer: Self-pay

## 2022-01-08 ENCOUNTER — Ambulatory Visit (INDEPENDENT_AMBULATORY_CARE_PROVIDER_SITE_OTHER): Payer: 59 | Admitting: Neurology

## 2022-01-08 ENCOUNTER — Encounter: Payer: Self-pay | Admitting: Neurology

## 2022-01-08 VITALS — BP 119/64 | HR 64 | Ht 69.0 in | Wt 152.5 lb

## 2022-01-08 DIAGNOSIS — Z9989 Dependence on other enabling machines and devices: Secondary | ICD-10-CM | POA: Diagnosis not present

## 2022-01-08 DIAGNOSIS — G4733 Obstructive sleep apnea (adult) (pediatric): Secondary | ICD-10-CM

## 2022-01-08 NOTE — Progress Notes (Signed)
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 Yearly  RV, compliance of CPAP; nasal pillow user.   Ricardo [name] compliance has been excellent 100% by days and time with an average of 6 hours and 47 minutes.  I have the pleasure to see him today after a 2-year hiatus in which she has remained highly compliant patient is AutoSet with a serial #[id number] has a minimum pressure setting of 6 and a maximum of 16 cmH2O with 1 cm expiratory pressure relief.  He is using a nasal pillow his 95th percentile is 20.4 L a minute his 95th percentile pressure is 9 cmH2O residual apnea-hypopnea index is 0.1.  Perfect data. He owns a chin strap- wants the same to be replaced but has to contact the DME that sold him that accessory.   Set up date for auto PAP was 08-2019- he is due for a new CPAP in 11/ 2025.     His first consultation was on 07-21-2019  upon referral by Dr Virgina [name],  10-02-2020: Interval History, I have the pleasure of meeting today with Ricardo [name], he has become a highly compliant CPAP user 97% for days and hours with an average user time at night of 6 hours 29 minutes.  He is using an AutoSet with a minimum pressure setting of 6 at the maximum pressure of 16 cmH2O.  He has an expiratory relief setting of 1 cm.  His residual AHI is 0.1 apneas per hour this is an excellent resolution.  his wife is sleeping much better in the same room. His snoring is gone. He is starting to like it, too. He uses a nasal pillow. ResMed P 10 airfit. I like for him to try a bella  swift with headgear option.      He was to great surprise diagnosed with a rather severe form of sleep apnea.  His sleep study was performed 08-06-2019. Slept for 57.6% of the recorded time.  The oxygen nadir was 78% the total time spent below 89% SPO2 equal 20 minutes.  There were several arousals most of them spontaneous but 43 of them associated with respiratory events.  There were 106 obstructive apneas 1 mixed apnea and 19 shallow breathing spells-hypopneas.  This amounts to an AHI per hour of sleep of 32.1.  In rem sleep the apnea index was accelerated to 52.1/h and the patient briefly resumed supine sleep he was noted to snore loudly.  He started  Using an auto CPAP, would ave liked to use a dental device , but his kind of apnea is not effectively responding to dental devices.   Ricardo [name] has used his machine 27 out of 30 days, with an average use at time of 5 hours and 4 minutes.  He is using an air sense 10 AutoSet with a serial #[id number] the minimum pressure is 6 the maximum pressure of  16 cmH2O with 1 cm expiratory pressure relief.  The 95 percentile pressure is 8.5 cmH2O well within his current settings.  His residual AHI is 0.3/h which is an excellent resolution of his not mild baseline apnea.  No central apneas have emerged.   He used first a FFM but had aerophagia and now is on nasal pillows.  He hasn't been sleepy in daytime an that hasn't changed, normally gets a good 5 hours of sleep in bloc, and then another 1-2 hours.    Given the symptoms of dry mouth and ongoing burping, and dry airway- throat irritation, feeling parched.   we can attribute this to oral air flow.     Last visit_ Chief concern according to patient : " I am woken up in the middle of the night with palpitations, and my wife stated I snore more"  and " I also have a brother with OSA diagnosis. I had a cardiac stent placed last year, followed by Ricardo [name]".   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  with a possible sleep disorder.  She has a  has a past medical history of Coronary heart disease, DDD (degenerative disc disease), cervical (11/19/2018), Difficult intubation, H/O heart artery stent, History of coronary artery disease (11/19/2018), History of seizures, Psoriasis (11/19/2018), and Psoriatic arthritis (Ricardo [name])..  He had a seizure disorder in his teens and early twenties, had an overnight study in [place], that was in 1970. He took Carbamazepine for 20 years. He is not sure how to differentiate a  seizure form a nightmare, it felt similar. He was observed to have rhythmic head movements. He felt as if being touched in his sleep. Never had any event in daytime. He experienced sleep paralysis. He has been followed by psoriatic arthritis.     Family medical /sleep history: one brother with OSA,.brother had CAD and Stent.     Social history: Patient is working as a Ricardo [name] Psychologist, counselling and lives in a household with his spouse. The couple has no children,.  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 Tobacco use: in college - now a party cigar smoker, none since stent .  ETOH use: drink 1 /month , Caffeine intake in form of Coffee( 2-3 /d) Soda( none ) Tea ( none ) ,no  energy drinks. Regular exercise in form of walking , was a regular at the gym.        Sleep habits are as follows: The patient's dinner time is between 8.30 PM.  The patient goes to bed at 10.30- 11  PM and continues to sleep for many hours, wakes rarely for bathroom breaks, he mostly wakes spontaneously due to palpitations( until 2 month ago- not sure what changed) the first time at 3-4 AM.   The preferred sleep position is on his left, with the support of 2 pillows. Dreams are reportedly frequent/vivid.  6.30  AM is the usual rise time. The patient wakes up spontaneously but has an alarm set, too.    He reports  feeling refreshed and  restored in AM, with symptoms such as dry mouth, dry throat, but no morning headaches and residual fatigue.  Naps are taken infrequently,very rarely.    Review of Systems: Out of a complete 14 system review, the patient complains of only the following symptoms, and all other reviewed systems are negative.:   fragmented sleep,  autoimmune disease, history of " seizures"    How likely are you to doze in the following situations: 0 = not likely, 1 = slight chance, 2 = moderate chance, 3 = [name] chance   Sitting and Reading? Watching Television? Sitting inactive in a public place (theater or meeting)? As a passenger in a car for an hour without a break? Lying down in the afternoon when circumstances permit? Sitting and talking to someone? Sitting quietly after lunch without alcohol? In a car, while stopped for a few minutes in traffic?   Total =1/ 24 points.   FSS endorsed at 18/ 63 points.   Wife is happy he no longer snores, sleeps 6-7 hours.   Social History   Socioeconomic History   Marital status: Married    Spouse name: Not on file   Number of children: Not on file   Years of education: Not on file   Highest education level: Doctor of medicine.   Occupational History   Not on file  Tobacco Use   Smoking status: Former    Types: Cigarettes    Quit date: 1993    Years since quitting: 30.2   Smokeless tobacco: Never  Vaping Use   Vaping Use: Never used  Substance and Sexual Activity   Alcohol use: Yes    Comment: socially   Drug use: Never   Sexual activity: Not on file  Other Topics Concern   Not on file  Social History Narrative   Right handed   3 cups coffee per day       Social Determinants of Health   Financial Resource Strain: Not on file  Food Insecurity: Not on file  Transportation Needs: Not on file  Physical Activity: Not on file  Stress: Not on file  Social Connections: Not on file    Family History   Problem Relation Age of Onset   Heart disease Father   Brother with OSA and CAD, stent   Past Medical History:  Diagnosis Date   Coronary heart disease    2 stent placed March 2019   DDD (degenerative disc disease), cervical 11/19/2018   Difficult intubation    pt stated small airway   H/O heart artery stent    History of coronary artery disease 11/19/2018   Status post stent placement March 2019   History of seizures    No seizures in over 20 years.    Psoriasis 11/19/2018   clobetasol   Psoriatic arthritis ([place])    On Cosentyx since 2017    Past Surgical History:  Procedure Laterality Date   COLONOSCOPY  2007 & 2016   Normal   COLONOSCOPY WITH PROPOFOL N/A 05/07/2021   Procedure: COLONOSCOPY WITH PROPOFOL;  Surgeon: Juanita Craver, MD;  [facility];  Service: Endoscopy;  Laterality: N/A;   CYSTOSCOPY Right 2009   Removal of Ureteric Stone   CYSTOSCOPY  12/2019   ESOPHAGOGASTRODUODENOSCOPY (EGD) WITH PROPOFOL N/A 05/07/2021   Procedure: ESOPHAGOGASTRODUODENOSCOPY (EGD) WITH PROPOFOL;  Surgeon: Juanita Craver, MD;  [facility];  Service: Endoscopy;  Laterality: N/A;   HEMORRHOID SURGERY  2004   External hemorrhoid removed    HERNIA REPAIR     INGUINAL HERNIA REPAIR  1976   PILONIDAL CYST / SINUS EXCISION  2001   [place]   and biopsy   UPPER GI ENDOSCOPY  2007 & 2016   Normal     Current Outpatient Medications on File Prior to Visit  Medication Sig Dispense Refill  atorvastatin (LIPITOR) 40 MG tablet TAKE 1 TABLET BY MOUTH ONCE A DAY 90 tablet 3   clobetasol cream (TEMOVATE) 0.01 % Apply 1 application topically 2 (two) times daily. 45 g 1   clopidogrel (PLAVIX) 75 MG tablet Take 1 tablet (75 mg total) by mouth daily. Please keep upcoming appt in January 2023 with Ricardo [name] before anymore refills. Thank you 90 tablet 0   Coenzyme Q10 200 MG capsule Take 200 mg by mouth daily.      COSENTYX SENSOREADY, 300 MG, 150 MG/ML SOAJ INJECT '300MG'$   INTO THE SKIN EVERY 28 DAYS. 2 mL 2   Cyanocobalamin (VITAMIN B12) 1000 MCG TBCR Take 1,000 mg by mouth 2 (two) times a week. Monday- Thursday     diclofenac Sodium (VOLTAREN) 1 % GEL Apply 2-4 grams to affected joint 4 times daily as needed. 400 g 2   loratadine (CLARITIN) 10 MG tablet Take 10 mg by mouth daily as needed for allergies.     metoprolol succinate (TOPROL-XL) 25 MG 24 hr tablet TAKE 1/2 TABLET (12.5 MG TOTAL) BY MOUTH ONCE A DAY (Patient taking differently: Take 12.5 mg by mouth daily.) 90 tablet 3   Multiple Vitamin (MULTIVITAMIN PO) Take 1 tablet by mouth daily.     nitroGLYCERIN (NITROSTAT) 0.4 MG SL tablet Place 1 tablet (0.4 mg total) under the tongue every 5 (five) minutes as needed for chest pain. 25 tablet 3   No current facility-administered medications on file prior to visit.   Physical exam:  Today's Vitals   01/08/22 1526  BP: 119/64  Pulse: 64  SpO2: 97%  Weight: 152 lb 8 oz (69.2 kg)  Height: '5\' 9"'$  (1.753 m)   Body mass index is 22.52 kg/m.   Wt Readings from Last 3 Encounters:  01/08/22 152 lb 8 oz (69.2 kg)  11/20/21 152 lb (68.9 kg)  08/14/21 160 lb (72.6 kg)     Ht Readings from Last 3 Encounters:  01/08/22 '5\' 9"'$  (1.753 m)  11/20/21 '5\' 9"'$  (1.753 m)  08/14/21 '5\' 9"'$  (1.753 m)      General: The patient is awake, alert and appears not in acute distress. The patient is well groomed. Head: Normocephalic, atraumatic. Neck is supple. Mallampati  3,  neck circumference:14. 75  inches . Nasal airflow s slightly congested.  Retrognathia is  seen.  Dental status:  Cardiovascular:  Regular rate and cardiac rhythm by pulse,  without distended neck veins. Respiratory: Lungs are clear to auscultation.  Skin:  Without evidence of ankle edema, or rash. Trunk: The patient's posture is erect.   Neurologic exam : The patient is awake and alert, oriented to place and time.   Memory subjective described as intact.  Attention span & concentration ability appears  normal.  Speech is fluent,  without  dysarthria, dysphonia or aphasia.  Mood and affect are appropriate.   Cranial nerves: no loss of smell or taste reported  Pupils are equal and briskly reactive to light.    Facial motor strength is symmetric and tongue and uvula move midline.  Neck  14. 75  ROM : rotation, tilt and flexion extension were normal for age and shoulder shrug was symmetrical.     RV in 12 month-uses a nasal pillow / mask N 30 I , we fitted him today for this type.   After spending a total time of 19 minutes face to face and additional time for physical and neurologic examination, review of laboratory studies,  personal review of imaging  studies, reports and results of other testing and review of referral information / records as far as provided in visit, I have established the following assessments: OSA on CPAP, therapy is effective but he has room for improvement on mask type.   I would like to thank Ricardo Baton, MD and  for allowing me to meet with and to take care of this pleasant patient.   In short, Ricardo [name] will follow up either personally or through our NP within the next 12 month.   CC: I will share my notes with Ricardo [name].   Electronically signed by: Ricardo Seat, MD 01/08/2022 4:03 PM  [facility] Board certified by The [employer] of Sleep Medicine and Diplomate of the [employer] of Sleep Medicine.  Board certified In Neurology through the [place], Fellow of the [employer] of Neurology. Medical Director of [facility].

## 2022-01-08 NOTE — Patient Instructions (Signed)

## 2022-01-14 ENCOUNTER — Other Ambulatory Visit (HOSPITAL_COMMUNITY): Payer: Self-pay

## 2022-01-15 ENCOUNTER — Other Ambulatory Visit (HOSPITAL_COMMUNITY): Payer: Self-pay

## 2022-01-22 NOTE — Progress Notes (Signed)
? ?Office Visit Note ? ?Patient: Ricardo [name]             ?Date of Birth: [birth year]           ?MRN: [id number]             ?PCP: Shon Baton, MD ?Referring: Shon Baton, MD ?Visit Date: 02/05/2022 ?Occupation: '@GUAROCC'$ @ ? ?Subjective:  ?Medication management. ? ?History of Present Illness: Ricardo [name] is a 66 y.o. male with history of psoriatic arthritis and psoriasis.  He has been having increased pain and swelling in his hands.  He is also experienced costochondral pain.  He had some discomfort in his left heel which resolved.  He does not give typical history of Achilles tendinitis of [place] fasciitis.  He denies uveitis or shortness of breath.  He states that his GI symptoms resolved. ? ?Activities of Daily Living:  ?Patient reports morning stiffness for a few minutes.   ?Patient Denies nocturnal pain.  ?Difficulty dressing/grooming: Denies ?Difficulty climbing stairs: Denies ?Difficulty getting out of chair: Denies ?Difficulty using hands for taps, buttons, cutlery, and/or writing: Reports ? ?Review of Systems  ?Constitutional:  Negative for fatigue.  ?HENT:  Negative for mouth sores, mouth dryness and nose dryness.   ?Eyes:  Negative for pain, itching and dryness.  ?Respiratory:  Negative for shortness of breath and difficulty breathing.   ?Cardiovascular:  Negative for chest pain and palpitations.  ?Gastrointestinal:  Negative for blood in stool, constipation and diarrhea.  ?Endocrine: Negative for increased urination.  ?Genitourinary:  Negative for difficulty urinating.  ?Musculoskeletal:  Positive for joint pain, joint pain, joint swelling and morning stiffness. Negative for myalgias, muscle tenderness and myalgias.  ?Skin:  Negative for color change, rash and redness.  ?Allergic/Immunologic: Negative for susceptible to infections.  ?Neurological:  Negative for dizziness, numbness, headaches, memory loss and weakness.  ?Hematological:  Negative for bruising/bleeding tendency.  ?Psychiatric/Behavioral:   Negative for confusion.   ? ?PMFS History:  ?Patient Active Problem List  ? Diagnosis Date Noted  ? OSA on CPAP 01/08/2022  ? 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 History of seizures as a child 07/21/2019  ? Snoring 07/21/2019  ? History of coronary artery disease 11/19/2018  ? Current use of anticoagulant therapy 11/19/2018  ? Psoriasis 11/19/2018  ? Psoriatic arthritis ([place]) 11/19/2018  ? High risk medication use 11/19/2018  ? DDD (degenerative disc disease), cervical 11/19/2018  ? Encounter for medication review 05/13/2018  ?  ?Past Medical History:  ?Diagnosis Date  ? Coronary heart disease   ? 2 stent placed March 2019  ? DDD (degenerative disc disease), cervical 11/19/2018  ? Difficult intubation   ? pt stated small airway  ? H/O heart artery stent   ? History of coronary artery disease 11/19/2018  ? Status post stent placement March 2019  ? History of seizures   ? No seizures in over 20 years.   ? Psoriasis 11/19/2018  ? clobetasol  ? Psoriatic arthritis ([place])   ? On Cosentyx since 2017  ?  ?Family History  ?Problem Relation Age of Onset  ? Heart disease Father   ? ?Past Surgical History:  ?Procedure Laterality Date  ? COLONOSCOPY  2007 & 2016  ? Normal  ? COLONOSCOPY WITH PROPOFOL N/A 05/07/2021  ? Procedure: COLONOSCOPY WITH PROPOFOL;  Surgeon: Juanita Craver, MD;  Location: [facility];  Service: Endoscopy;  Laterality: N/A;  ? CYSTOSCOPY Right 2009  ? Removal of Ureteric Stone  ? CYSTOSCOPY  12/2019  ?  ESOPHAGOGASTRODUODENOSCOPY (EGD) WITH PROPOFOL N/A 05/07/2021  ? Procedure: ESOPHAGOGASTRODUODENOSCOPY (EGD) WITH PROPOFOL;  Surgeon: Juanita Craver, MD;  Location: [facility];  Service: Endoscopy;  Laterality: N/A;  ? HEMORRHOID SURGERY  2004  ? External hemorrhoid removed   ? HERNIA REPAIR    ? [place]  ? PILONIDAL CYST / SINUS EXCISION  2001  ? THORACOTOMY  1997  ? and biopsy  ? UPPER GI ENDOSCOPY  2007 & 2016  ? Normal  ? ?Social History  ? ?Social History Narrative  ?  Right handed  ? 3 cups coffee per day  ?    ? ?Immunization History  ?Administered Date(s) Administered  ? PFIZER(Purple Top)SARS-COV-2 Vaccination 10/23/2019, 11/14/2019, 07/13/2020, 07/21/2021  ?  ? ?Objective: ?Vital Signs: BP 123/77 (BP Location: Left Arm, Patient Position: Sitting, Cuff Size: Normal)   Pulse 60   Ht '5\' 9"'$  (1.753 m)   Wt 150 lb 12.8 oz (68.4 kg)   BMI 22.27 kg/m?   ? ?Physical Exam ?Vitals and nursing note reviewed.  ?Constitutional:   ?   Appearance: He is well-developed.  ?HENT:  ?   Head: Normocephalic and atraumatic.  ?Eyes:  ?   Conjunctiva/sclera: Conjunctivae normal.  ?   Pupils: Pupils are equal, round, and reactive to light.  ?Cardiovascular:  ?   Rate and Rhythm: Normal rate and regular rhythm.  ?   Heart sounds: Normal heart sounds.  ?Pulmonary:  ?   Effort: Pulmonary effort is normal.  ?   Breath sounds: Normal breath sounds.  ?Abdominal:  ?   General: Bowel sounds are normal.  ?   Palpations: Abdomen is soft.  ?Musculoskeletal:  ?   Cervical back: Normal range of motion and neck supple.  ?Skin: ?   General: Skin is warm and dry.  ?   Capillary Refill: Capillary refill takes less than 2 seconds.  ?Neurological:  ?   Mental Status: He is alert and oriented to person, place, and time.  ?Psychiatric:     ?   Behavior: Behavior normal.  ?  ? ?Musculoskeletal Exam: C-spine was in good range of motion.  He had some thoracic kyphosis.  There was no point tenderness over thoracic or lumbar spine.  There was no SI joint tenderness.  He had tenderness on palpation over his right fourth costochondral junction.  Shoulder joints and elbow joints in good range of motion.  Wrist joints with good range of motion.  He has synovitis over some of the MCPs and PIPs as described below.  Hip joints and knee joints were in good range of motion.  There was no tenderness over ankles or MTPs. ? ?CDAI Exam: ?CDAI Score: 9  ?Patient Global: 5 mm; Provider Global: 5 mm ?Swollen: 3 ; Tender: 5  ?Joint Exam  02/05/2022  ? ?   Right  Left  ?MCP 1  Swollen Tender   Tender  ?MCP 2     Swollen Tender  ?IP      Tender  ?PIP 5  Swollen Tender     ? ? ? ?Investigation: ?No additional findings. ? ?Imaging: ?No results found. ? ?Recent Labs: ?Lab Results  ?Component Value Date  ? WBC 8.7 11/22/2021  ? HGB 13.6 11/22/2021  ? PLT 231 11/22/2021  ? NA 143 11/22/2021  ? K 4.4 11/22/2021  ? CL 104 11/22/2021  ? CO2 35 (H) 11/22/2021  ? GLUCOSE 73 11/22/2021  ? BUN 13 11/22/2021  ? CREATININE 0.78 11/22/2021  ? BILITOT  0.3 11/22/2021  ? ALKPHOS 79 01/13/2020  ? AST 19 11/22/2021  ? ALT 17 11/22/2021  ? PROT 7.4 11/22/2021  ? ALBUMIN 4.4 01/13/2020  ? CALCIUM 9.5 11/22/2021  ? GFRAA 109 05/10/2021  ? QFTBGOLDPLUS NEGATIVE 05/10/2021  ? ? ?Speciality Comments: Inadequate response to methotrexate, Enbrel, Humira ?Cosyntex November 2017-April 2023 ?Taltz- ? ?Procedures:  ?No procedures performed ?Allergies: Patient has no known allergies.  ? ?Assessment / Plan:     ?Visit Diagnoses: Psoriatic arthritis ([place]) - diagnosed in 1999, treated by Dr. Kerry Hough in [place] -he had inadequate response to methotrexate, Enbrel, Humira.  He had been on Cosentyx since November 2017.  He continues to have intermittent pain and swelling in his joints.  He presents today with pain and swelling in multiple joints.  He had a detailed discussion regarding different treatment options and their side effects.  I discussed possible use of Taltz.  Indications side effects contraindications of Taltz were discussed.  Handout was given and consent was obtained.  We will apply for Taltz.  Once it is available we will bring him in for his first injection.  He took his last dose of Cosentyx last week.  He will discontinue Cosentyx. ? ?Medication counseling: ? ?Baseline Immunosuppressant Therapy Labs ?TB GOLD ? ?  Latest Ref Rng & Units 05/10/2021  ?  1:55 PM  ?Quantiferon TB Gold  ?Quantiferon TB Gold Plus NEGATIVE NEGATIVE    ? ?Hepatitis Panel ?  ?HIV ?Lab Results   ?Component Value Date  ? HIV NON-REACTIVE 11/19/2018  ? ?Immunoglobulins ?  ?SPEP ? ?  Latest Ref Rng & Units 11/22/2021  ?  2:00 PM  ?Serum Protein Electrophoresis  ?Total Protein 6.1 - 8.1 g/dL 7.4    ? ? ? ?Does p

## 2022-02-02 ENCOUNTER — Other Ambulatory Visit: Payer: Self-pay | Admitting: Interventional Cardiology

## 2022-02-03 ENCOUNTER — Other Ambulatory Visit (HOSPITAL_COMMUNITY): Payer: Self-pay

## 2022-02-03 MED ORDER — METOPROLOL SUCCINATE ER 25 MG PO TB24
ORAL_TABLET | ORAL | 3 refills | Status: DC
  Filled 2022-02-03: qty 45, 90d supply, fill #0
  Filled 2022-02-17: qty 90, 90d supply, fill #0
  Filled 2022-02-17: qty 45, 90d supply, fill #0
  Filled 2022-05-12: qty 45, 90d supply, fill #1
  Filled 2022-08-16: qty 45, 90d supply, fill #2
  Filled 2022-11-08: qty 45, 90d supply, fill #3

## 2022-02-03 MED ORDER — CLOPIDOGREL BISULFATE 75 MG PO TABS
75.0000 mg | ORAL_TABLET | Freq: Every day | ORAL | 3 refills | Status: DC
  Filled 2022-02-03 – 2022-02-17 (×2): qty 90, 90d supply, fill #0
  Filled 2022-05-12: qty 90, 90d supply, fill #1
  Filled 2022-08-16: qty 90, 90d supply, fill #2
  Filled 2022-11-08 (×3): qty 90, 90d supply, fill #3

## 2022-02-04 ENCOUNTER — Other Ambulatory Visit (HOSPITAL_COMMUNITY): Payer: Self-pay

## 2022-02-05 ENCOUNTER — Encounter: Payer: Self-pay | Admitting: Rheumatology

## 2022-02-05 ENCOUNTER — Telehealth: Payer: Self-pay | Admitting: Pharmacist

## 2022-02-05 ENCOUNTER — Ambulatory Visit (INDEPENDENT_AMBULATORY_CARE_PROVIDER_SITE_OTHER): Payer: 59 | Admitting: Rheumatology

## 2022-02-05 VITALS — BP 123/77 | HR 60 | Ht 69.0 in | Wt 150.8 lb

## 2022-02-05 DIAGNOSIS — L409 Psoriasis, unspecified: Secondary | ICD-10-CM

## 2022-02-05 DIAGNOSIS — Z79899 Other long term (current) drug therapy: Secondary | ICD-10-CM

## 2022-02-05 DIAGNOSIS — K2100 Gastro-esophageal reflux disease with esophagitis, without bleeding: Secondary | ICD-10-CM | POA: Diagnosis not present

## 2022-02-05 DIAGNOSIS — Z8639 Personal history of other endocrine, nutritional and metabolic disease: Secondary | ICD-10-CM | POA: Diagnosis not present

## 2022-02-05 DIAGNOSIS — Z8679 Personal history of other diseases of the circulatory system: Secondary | ICD-10-CM | POA: Diagnosis not present

## 2022-02-05 DIAGNOSIS — L4050 Arthropathic psoriasis, unspecified: Secondary | ICD-10-CM

## 2022-02-05 DIAGNOSIS — Z7901 Long term (current) use of anticoagulants: Secondary | ICD-10-CM

## 2022-02-05 DIAGNOSIS — M1811 Unilateral primary osteoarthritis of first carpometacarpal joint, right hand: Secondary | ICD-10-CM | POA: Diagnosis not present

## 2022-02-05 DIAGNOSIS — G4733 Obstructive sleep apnea (adult) (pediatric): Secondary | ICD-10-CM | POA: Diagnosis not present

## 2022-02-05 NOTE — Telephone Encounter (Signed)
Please start [place]. ? ?Dose: '160mg'$  at Week 0, '80mg'$  at [facility] 2, 4, 6, 8, 10, and 12 then '80mg'$  SQ every 4 weeks thereafter ? ?Dx: Psoriatic arthritis (L40.5) and Psoriasis (L40.9) ? ?Previously tried therapies: ?Methotrexate in 2002 for 5 years (waning clinical response) ?Enbrel in 2007 (waning clinical response) ?Humira in 2016 for 3 months (waning clinical response) ?Cosentyx (started Nov 2017 to current and waning clinical response ? ?Therapies patient unable to try: ?JAK inhibitors (Rinvoq, Olumiant, Morrie Sheldon) - has history of CAD ? ?Once approved, patient is eligible for copay card. He can start [place] on or after 03/03/22 (since his last Cosentyx dose was on 02/03/22) ? ?Knox Saliva, PharmD, MPH, BCPS ?Clinical Pharmacist (Rheumatology and Pulmonology) ? ?

## 2022-02-05 NOTE — Patient Instructions (Addendum)
Ixekizumab injection ?What is this medication? ?IXEKIZUMAB (ix e KIZ ue mab) is used to treat plaque psoriasis, psoriatic arthritis, ankylosing spondylitis, and active non-radiographic axial spondyloarthritis. ?This medicine may be used for other purposes; ask your health care provider or pharmacist if you have questions. ?COMMON BRAND NAME(S): TALTZ ?What should I tell my care team before I take this medication? ?They need to know if you have any of these conditions: ?immune system problems ?infection (especially a viral infection such as chickenpox, cold sores, or herpes) ?recently received or are scheduled to receive a vaccine ?tuberculosis, a positive skin test for tuberculosis, or have recently been in close contact with someone who has tuberculosis ?an unusual or allergic reaction to ixekizumab, other medicines, foods, dyes or preservatives ?pregnant or trying to get pregnant ?breast-feeding ?How should I use this medication? ?This medicine is for injection under the skin. It may be administered by a healthcare professional in a hospital or clinic setting or at home. If you get this medicine at home, you will be taught how to prepare and give this medicine. Use exactly as directed. Take your medicine at regular intervals. Do not take your medicine more often than directed. ?It is important that you put your used needles and syringes in a special sharps container. Do not put them in a trash can. If you do not have a sharps container, call your pharmacist or healthcare provider to get one. ?A special MedGuide will be given to you by the pharmacist with each prescription and refill. Be sure to read this information carefully each time. ?Talk to your pediatrician regarding the use of this medicine in children. While this drug may be prescribed for children as young as 6 years for selected conditions, precautions do apply. ?Overdosage: If you think you have taken too much of this medicine contact a poison control  center or emergency room at once. ?NOTE: This medicine is only for you. Do not share this medicine with others. ?What if I miss a dose? ?It is important not to miss your dose. Call your doctor of health care professional if you are unable to keep an appointment. If you give yourself the medicine and you miss a dose, take it as soon as you can. Then be sure to take your next doses on your regular schedule. Do not take double or extra doses. If you have questions about a missed injection, call your health care professional. ?What may interact with this medication? ?Do not take this medicine with any of the following medications: ?live virus vaccines ?This medicine may also interact with the following medications: ?inactivated vaccines ?This list may not describe all possible interactions. Give your health care provider a list of all the medicines, herbs, non-prescription drugs, or dietary supplements you use. Also tell them if you smoke, drink alcohol, or use illegal drugs. Some items may interact with your medicine. ?What should I watch for while using this medication? ?Tell your doctor or healthcare professional if your symptoms do not start to get better or if they get worse. ?You will be tested for tuberculosis (TB) before you start this medicine. If your doctor prescribes any medicine for TB, you should start taking the TB medicine before starting this medicine. Make sure to finish the full course of TB medicine. ?Call your doctor or healthcare professional for advice if you get a fever, chills or sore throat, or other symptoms of a cold or flu. Do not treat yourself. This drug decreases your body's ability to  fight infections. Try to avoid being around people who are sick. ?This medicine can decrease the response to a vaccine. If you need to get vaccinated, tell your healthcare professional if you have received this medicine within the last 6 months. Extra booster doses may be needed. Talk to your doctor to see  if a different vaccination schedule is needed. ?What side effects may I notice from receiving this medication? ?Side effects that you should report to your doctor or health care professional as soon as possible: ?allergic reactions like skin rash, itching or hives, swelling of the face, lips, or tongue ?signs and symptoms of infection like fever or chills; cough; sore throat; pain or trouble passing urine ?signs and symptoms of bowel problems like abdominal pain, diarrhea, blood in the stool, and weight loss ?white patches in the mouth or throat ?vaginal discharge, itching, or odor in women ?Side effects that usually do not require medical attention (report to your doctor or health care professional if they continue or are bothersome): ?nausea ?runny nose ?sinus trouble ?This list may not describe all possible side effects. Call your doctor for medical advice about side effects. You may report side effects to FDA at 1-800-FDA-1088. ?Where should I keep my medication? ?Keep out of the reach of children. ?Store the prefilled syringe or injection pen in a refrigerator between 2 to 8 degrees C (36 to 46 degrees F). Keep the syringe or the pen in the original carton until ready for use. Protect from light. Do not freeze. Do not shake. Prior to use, remove the syringe or pen from the refrigerator and use within 30 minutes. Throw away any unused medicine after the expiration date on the label. ?NOTE: This sheet is a summary. It may not cover all possible information. If you have questions about this medicine, talk to your doctor, pharmacist, or health care provider. ?? 2022 Elsevier/Gold Standard (2021-07-09 00:00:00) ? ? ?Standing Labs ?We placed an order today for your standing lab work.  ? ?Please have your standing labs drawn in April and every 3 months ? ?If possible, please have your labs drawn 2 weeks prior to your appointment so that the provider can discuss your results at your appointment. ? ?Please note that you  may see your imaging and lab results in [place] before we have reviewed them. ?We may be awaiting multiple results to interpret others before contacting you. ?Please allow our office up to 72 hours to thoroughly review all of the results before contacting the office for clarification of your results. ? ?We have open lab daily: ?Monday through Thursday from 1:30-4:30 PM and Friday from 1:30-4:00 PM ?at the office of Dr. Bo Merino, [facility].   ?Please be advised, all patients with office appointments requiring lab work will take precedent over walk-in lab work.  ?If possible, please come for your lab work on Monday and Friday afternoons, as you may experience shorter wait times. ?The office is located at [address], [place], [place], [place] [address] ?No appointment is necessary.   ?Labs are drawn by [facility]. Please bring your co-pay at the time of your lab draw.  You may receive a bill from [facility] for your lab work. ? ?Please note if you are on Hydroxychloroquine and and an order has been placed for a Hydroxychloroquine level, you will need to have it drawn 4 hours or more after your last dose. ? ?If you wish to have your labs drawn at another location, please call the office  24 hours in advance to send orders. ? ?If you have any questions regarding directions or hours of operation,  ?please call [phone].   ?As a reminder, please drink plenty of water prior to coming for your lab work. Thanks!  ? ?Vaccines ?You are taking a medication(s) that can suppress your immune system.  The following immunizations are recommended: ?Flu annually ?Covid-19  ?Td/Tdap (tetanus, diphtheria, pertussis) every 10 years ?Pneumonia (Prevnar 15 then Pneumovax 23 at least 1 year apart.  Alternatively, can take Prevnar 20 without needing additional dose) ?Shingrix: 2 doses from 4 weeks to 6 months apart ? ?Please check with your PCP to make sure you are up to date.  ? ?If you have signs or symptoms of an  infection or start antibiotics: ?First, call your PCP for workup of your infection. ?Hold your medication through the infection, until you complete your antibiotics, and until symptoms resolve if you take

## 2022-02-05 NOTE — Progress Notes (Signed)
Pharmacy Note ? ?Subjective:  ?Patient presents today to [facility] for follow up office visit.  Patient was seen by the pharmacist for counseling on [place] for psoriatic arthritis and plaque psoriasis.Marland Kitchen  His last Cosentyx dose was on 02/03/22. Prior therapy includes: methotrexate in 2002 for 5 years (waning clinical response). Switched to Enbrel in 2007 (waning clinical response), Humira in 2016 for 3 months (waning clinical response)., Cosentyx (started Nov 2017 to current and waning clinical response) ? ?History of inflammatory bowel disease: No ? ?Objective:  ?CBC ?   ?Component Value Date/Time  ? WBC 8.7 11/22/2021 1400  ? RBC 4.43 11/22/2021 1400  ? HGB 13.6 11/22/2021 1400  ? HGB 13.0 10/06/2019 0803  ? HCT 40.8 11/22/2021 1400  ? HCT 38.5 10/06/2019 0803  ? PLT 231 11/22/2021 1400  ? PLT 242 10/06/2019 0803  ? MCV 92.1 11/22/2021 1400  ? MCV 90 10/06/2019 0803  ? MCH 30.7 11/22/2021 1400  ? MCHC 33.3 11/22/2021 1400  ? RDW 11.8 11/22/2021 1400  ? RDW 11.8 10/06/2019 0803  ? LYMPHSABS 1,958 11/22/2021 1400  ? LYMPHSABS 1.9 10/06/2019 0803  ? EOSABS 392 11/22/2021 1400  ? EOSABS 0.5 (H) 10/06/2019 0803  ? BASOSABS 87 11/22/2021 1400  ? BASOSABS 0.1 10/06/2019 0803  ? ? ?CMP  ?   ?Component Value Date/Time  ? NA 143 11/22/2021 1400  ? NA 140 01/13/2020 1211  ? K 4.4 11/22/2021 1400  ? CL 104 11/22/2021 1400  ? CO2 35 (H) 11/22/2021 1400  ? GLUCOSE 73 11/22/2021 1400  ? BUN 13 11/22/2021 1400  ? BUN 15 01/13/2020 1211  ? CREATININE 0.78 11/22/2021 1400  ? CALCIUM 9.5 11/22/2021 1400  ? PROT 7.4 11/22/2021 1400  ? PROT 7.2 01/13/2020 1211  ? ALBUMIN 4.4 01/13/2020 1211  ? AST 19 11/22/2021 1400  ? ALT 17 11/22/2021 1400  ? ALKPHOS 79 01/13/2020 1211  ? BILITOT 0.3 11/22/2021 1400  ? BILITOT 0.2 01/13/2020 1211  ? GFRNONAA 94 05/10/2021 1355  ? GFRAA 109 05/10/2021 1355  ? ? ?Baseline Immunosuppressant Therapy Labs ? ? ?  Latest Ref Rng & Units 05/10/2021  ?  1:55 PM  ?Quantiferon TB Gold  ?Quantiferon TB  Gold Plus NEGATIVE NEGATIVE    ? ?Hepatitis B negative 11/28/99 ?Hepatitis C negative 02/19/16 ? ?Lab Results  ?Component Value Date  ? HIV NON-REACTIVE 11/19/2018  ? ? ? ?  Latest Ref Rng & Units 11/22/2021  ?  2:00 PM  ?Serum Protein Electrophoresis  ?Total Protein 6.1 - 8.1 g/dL 7.4    ? ? ?Assessment/Plan:  ?Counseled patient that Donnetta Hail is a IL-17 inhibitor that works to reduce pain and inflammation associated with arthritis.  Counseled patient on purpose, proper use, and adverse effects of Taltz. Reviewed the most common adverse effects of infection (more commonly nasopharyngitis, URTI), inflammatory bowel disease, and allergic reaction. Counseled patient that Donnetta Hail should be held for infection and prior to scheduled surgery.  Counseled patient to avoid live vaccines while on Taltz. Recommend annual influenza, PCV 15 or PCV20 or Pneumovax 23, and Shingrix as indicated. Reviewed storage information for Taltz. ? ?Reviewed the importance of regular labs while on [place]. Will monitor CBC and CMP 1 month after starting and every 3 months routinely thereafter. Will monitor TB gold annually. Standing orders placed. Provided patient with medication education material and answered all questions.  Patient consented to [place].  Will upload consent into patient's chart.  Will apply for Taltz through [employer] and  update when we receive a response.  Advised initial injection must be administered in office.  Patient voiced understanding.   ? ?Taltz dose will be: For psoriatic arthritis and plaque psoriasis overlap load of 160 mg then 80 mg on weeks 2,4,6,8,10,12 then 80 mg every 28 days  Prescription will be sent to pharmacy pending lab results and insurance approval.  ? ?His last Cosentyx dose was on 02/03/22. Once approved, he is able to start [place] on or after 03/03/22. ? ?Knox Saliva, PharmD, MPH, BCPS ?Clinical Pharmacist (Rheumatology and Pulmonology) ?

## 2022-02-05 NOTE — Telephone Encounter (Signed)
Submitted a Prior Authorization request to [facility] for TALTZ via CoverMyMeds. Will update once we receive a response. ? ? ?Key: [id number] ?

## 2022-02-06 ENCOUNTER — Other Ambulatory Visit (HOSPITAL_COMMUNITY): Payer: Self-pay

## 2022-02-06 DIAGNOSIS — G4733 Obstructive sleep apnea (adult) (pediatric): Secondary | ICD-10-CM | POA: Diagnosis not present

## 2022-02-10 ENCOUNTER — Other Ambulatory Visit (HOSPITAL_COMMUNITY): Payer: Self-pay

## 2022-02-10 NOTE — Telephone Encounter (Signed)
Received notification from [facility] regarding a prior authorization for [place]. Authorization has been APPROVED in separate quantities: ?Auth #[id number] from 02/07/22 through 03/09/22 - 1 fill ?Auth #[id number] from 02/28/22 through 04/29/22 for 2 pens per 28 days for 2 fills ?Auth #[id number] from 04/2022 through 07/21/22 for 1 pen per 28 days for 3 fills ? ?Per test claim, copay for first 28 days supply is $692 ? ?Patient must fill through [place]: [phone]  ? ?Phone # [phone] ? ?Patient is eligible for Taltz copay card. ATC patient to enroll in copay card and schedule new start visit on or after 03/03/22. Unable to reach. Left VM requesting return call. Will need copay card information to courier rx from [facility] for new start visit. ? ?Knox Saliva, PharmD, MPH, BCPS ?Clinical Pharmacist (Rheumatology and Pulmonology) ? ?

## 2022-02-11 ENCOUNTER — Other Ambulatory Visit (HOSPITAL_COMMUNITY): Payer: Self-pay

## 2022-02-12 ENCOUNTER — Other Ambulatory Visit (HOSPITAL_COMMUNITY): Payer: Self-pay

## 2022-02-12 MED ORDER — TALTZ 80 MG/ML ~~LOC~~ SOAJ
SUBCUTANEOUS | 0 refills | Status: DC
  Filled 2022-02-12: qty 3, fill #0

## 2022-02-12 NOTE — Telephone Encounter (Signed)
Spoke with patient. Enrolled in [place] card and to receive free sharps box. ? ?Taltz copay card: ?RXBIN: [id number] ?PCN: [id number] ?GRP: [id number] ?ID: [id number] ?Expiration Date: 11/02/2024 ? ?Patient scheduled for Taltz new start on 03/05/22. Rx sent to [facility] to be couriered to clinic prior to appt. Patient advised that if there is $5 copay, they will reach out to collect prior to visit. He verbalized understanding. ? ?Knox Saliva, PharmD, MPH, BCPS ?Clinical Pharmacist (Rheumatology and Pulmonology) ?

## 2022-02-12 NOTE — Telephone Encounter (Signed)
Received return call from patient regarding Taltz regarding approval. Returned call to patient this morning but he states he is in a meeting. States he will call back around 9:30a ? ?Knox Saliva, PharmD, MPH, BCPS ?Clinical Pharmacist (Rheumatology and Pulmonology) ?

## 2022-02-13 ENCOUNTER — Other Ambulatory Visit (HOSPITAL_COMMUNITY): Payer: Self-pay

## 2022-02-13 ENCOUNTER — Telehealth: Payer: Self-pay | Admitting: Pharmacist

## 2022-02-13 ENCOUNTER — Ambulatory Visit: Payer: 59 | Attending: Internal Medicine | Admitting: Pharmacist

## 2022-02-13 DIAGNOSIS — L4050 Arthropathic psoriasis, unspecified: Secondary | ICD-10-CM

## 2022-02-13 DIAGNOSIS — L409 Psoriasis, unspecified: Secondary | ICD-10-CM

## 2022-02-13 DIAGNOSIS — Z79899 Other long term (current) drug therapy: Secondary | ICD-10-CM

## 2022-02-13 MED ORDER — TALTZ 80 MG/ML ~~LOC~~ SOAJ
SUBCUTANEOUS | 0 refills | Status: DC
  Filled 2022-02-13: qty 3, 14d supply, fill #0

## 2022-02-13 NOTE — Telephone Encounter (Signed)
Called patient to schedule an appointment for the [facility] [employer] [facility] [employer] [facility]. I was unable to reach the patient so I left a HIPAA-compliant message requesting that the patient return my call.   Luke Van Ausdall, PharmD, BCACP, CPP Clinical Pharmacist [facility] [phone]  

## 2022-02-13 NOTE — Progress Notes (Signed)
? ?  S: ?Patient presents today for review of their specialty medication.  ? ?Patient is about to start [address] for psoriatic arthriits. Patient is managed by Dr. Estanislado Pandy for this. he had inadequate response to methotrexate, Enbrel, Humira. He was on Cosentyx for some time but continued to experience intermittent pain and swelling. Therefore, Dr. Estanislado Pandy is changing him to [facility].  ? ?Dosing: ?Psoriatic arthritis: SubQ: 160 mg once, followed by 80 mg every 4 weeks; may administer alone or in combination with conventional disease-modifying antirheumatic drugs (eg, methotrexate). Note: For psoriatic arthritis patients with coexisting moderate to severe plaque psoriasis, use the dosing regimen for plaque psoriasis. ? ?Adherence: has not yet started  ? ?Efficacy: has not yet started  ? ?Monitoring:  ?S/sx infection: none reported ?Injection site reactions:  none reported ?S/sx of IBD:  none reported ?S/sx of hypersensitivity reaction:  none reported ? ?Current adverse effects:  none reported ? ? ?O: ?   ? ?Lab Results  ?Component Value Date  ? WBC 8.7 11/22/2021  ? HGB 13.6 11/22/2021  ? HCT 40.8 11/22/2021  ? MCV 92.1 11/22/2021  ? PLT 231 11/22/2021  ? ? ?  Chemistry   ?   ?Component Value Date/Time  ? NA 143 11/22/2021 1400  ? NA 140 01/13/2020 1211  ? K 4.4 11/22/2021 1400  ? CL 104 11/22/2021 1400  ? CO2 35 (H) 11/22/2021 1400  ? BUN 13 11/22/2021 1400  ? BUN 15 01/13/2020 1211  ? CREATININE 0.78 11/22/2021 1400  ?    ?Component Value Date/Time  ? CALCIUM 9.5 11/22/2021 1400  ? ALKPHOS 79 01/13/2020 1211  ? AST 19 11/22/2021 1400  ? ALT 17 11/22/2021 1400  ? BILITOT 0.3 11/22/2021 1400  ? BILITOT 0.2 01/13/2020 1211  ?  ? ? ? ?A/P: ?1. Medication review: patient about to start [place] for psoriatic arthritis. Reviewed the medication with the patient, including the following: Donnetta Hail is a medication used to treat ankylosing spondylitis, plaque psoriasis, and psoriatic arthritis. Subcutaneous: Allow to reach room  temperature prior to injection (30 minutes). Do not shake. Inject full amount into the upper arms, thighs or any quadrant of the abdomen; administer each injection at a different anatomic location than a previous injection and avoid areas where the skin is tender, bruised, erythematous, indurated, or affected by psoriasis. Administration in the upper, outer arm may be performed by a caregiver or health care provider. Ixekizumab is intended for use under the guidance and supervision of a physician; may be self-injected by the patient following proper training in SubQ injection technique. Possible adverse effects include neutropenia, antibody development, increased risk of infection, injection site reaction, hypersensitivity reactions, and inflammatory bowel disease. Avoid live vaccinations. No recommendations for any changes at this time. ? ?Ricardo [name], PharmD, BCACP, CPP ?Clinical Pharmacist ?[place] ?[phone] ? ? ? ?

## 2022-02-14 ENCOUNTER — Other Ambulatory Visit (HOSPITAL_COMMUNITY): Payer: Self-pay

## 2022-02-17 ENCOUNTER — Other Ambulatory Visit (HOSPITAL_COMMUNITY): Payer: Self-pay

## 2022-02-28 ENCOUNTER — Other Ambulatory Visit (HOSPITAL_COMMUNITY): Payer: Self-pay

## 2022-03-03 NOTE — Telephone Encounter (Signed)
Taltz shipment received from [facility]. Placed in refrigerator. ? ?Knox Saliva, PharmD, MPH, BCPS, CPP ?Clinical Pharmacist (Rheumatology and Pulmonology) ?

## 2022-03-05 ENCOUNTER — Ambulatory Visit (INDEPENDENT_AMBULATORY_CARE_PROVIDER_SITE_OTHER): Payer: 59 | Admitting: Pharmacist

## 2022-03-05 ENCOUNTER — Other Ambulatory Visit: Payer: Self-pay | Admitting: Pharmacist

## 2022-03-05 ENCOUNTER — Other Ambulatory Visit (HOSPITAL_COMMUNITY): Payer: Self-pay

## 2022-03-05 DIAGNOSIS — Z79899 Other long term (current) drug therapy: Secondary | ICD-10-CM

## 2022-03-05 DIAGNOSIS — L409 Psoriasis, unspecified: Secondary | ICD-10-CM

## 2022-03-05 DIAGNOSIS — L4050 Arthropathic psoriasis, unspecified: Secondary | ICD-10-CM

## 2022-03-05 DIAGNOSIS — Z7189 Other specified counseling: Secondary | ICD-10-CM

## 2022-03-05 MED ORDER — TALTZ 80 MG/ML ~~LOC~~ SOAJ
SUBCUTANEOUS | 1 refills | Status: DC
  Filled 2022-03-05: qty 2, fill #0
  Filled 2022-03-20: qty 2, 28d supply, fill #0
  Filled 2022-04-21: qty 2, 28d supply, fill #1

## 2022-03-05 MED ORDER — TALTZ 80 MG/ML ~~LOC~~ SOAJ
SUBCUTANEOUS | 1 refills | Status: DC
  Filled 2022-03-05: qty 2, fill #0

## 2022-03-05 MED ORDER — TALTZ 80 MG/ML ~~LOC~~ SOAJ
80.0000 mg | SUBCUTANEOUS | 1 refills | Status: DC
  Filled 2022-03-05: qty 1, fill #0
  Filled 2022-05-21: qty 1, 28d supply, fill #0

## 2022-03-05 NOTE — Progress Notes (Signed)
Pharmacy Note ? ?Subjective:   ?Patient presents to clinic today to receive first dose of Taltz for PsA/psoriasis. Previous therapies include: ?Methotrexate started in 2002 which he took for 5 years and had a waning clinical response. ?Enbrel in 2007 with waning clinical response ?Humira in 2016 for 3 months with inadequate clinical response ?Cosentyx started Nov 2017 and has had waning clinical response. He is transitioning to [employer] from [employer] ? ?Patient running a fever or have signs/symptoms of infection? No ? ?Patient currently on antibiotics for the treatment of infection? No ? ?Patient have any upcoming invasive procedures/surgeries? No ? ?Objective: ?CMP  ?   ?Component Value Date/Time  ? NA 143 11/22/2021 1400  ? NA 140 01/13/2020 1211  ? K 4.4 11/22/2021 1400  ? CL 104 11/22/2021 1400  ? CO2 35 (H) 11/22/2021 1400  ? GLUCOSE 73 11/22/2021 1400  ? BUN 13 11/22/2021 1400  ? BUN 15 01/13/2020 1211  ? CREATININE 0.78 11/22/2021 1400  ? CALCIUM 9.5 11/22/2021 1400  ? PROT 7.4 11/22/2021 1400  ? PROT 7.2 01/13/2020 1211  ? ALBUMIN 4.4 01/13/2020 1211  ? AST 19 11/22/2021 1400  ? ALT 17 11/22/2021 1400  ? ALKPHOS 79 01/13/2020 1211  ? BILITOT 0.3 11/22/2021 1400  ? BILITOT 0.2 01/13/2020 1211  ? GFRNONAA 94 05/10/2021 1355  ? GFRAA 109 05/10/2021 1355  ? ? ?CBC ?   ?Component Value Date/Time  ? WBC 8.7 11/22/2021 1400  ? RBC 4.43 11/22/2021 1400  ? HGB 13.6 11/22/2021 1400  ? HGB 13.0 10/06/2019 0803  ? HCT 40.8 11/22/2021 1400  ? HCT 38.5 10/06/2019 0803  ? PLT 231 11/22/2021 1400  ? PLT 242 10/06/2019 0803  ? MCV 92.1 11/22/2021 1400  ? MCV 90 10/06/2019 0803  ? MCH 30.7 11/22/2021 1400  ? MCHC 33.3 11/22/2021 1400  ? RDW 11.8 11/22/2021 1400  ? RDW 11.8 10/06/2019 0803  ? LYMPHSABS 1,958 11/22/2021 1400  ? LYMPHSABS 1.9 10/06/2019 0803  ? EOSABS 392 11/22/2021 1400  ? EOSABS 0.5 (H) 10/06/2019 0803  ? BASOSABS 87 11/22/2021 1400  ? BASOSABS 0.1 10/06/2019 0803  ? ? ?Baseline Immunosuppressant Therapy Labs ?TB  GOLD ? ?  Latest Ref Rng & Units 05/10/2021  ?  1:55 PM  ?Quantiferon TB Gold  ?Quantiferon TB Gold Plus NEGATIVE NEGATIVE    ? ?Hepatitis B negative 11/28/99 ?Hepatitis C negative 02/19/16 ? ?HIV ?Lab Results  ?Component Value Date  ? HIV NON-REACTIVE 11/19/2018  ? ?Immunoglobulins ?  ?SPEP ? ?  Latest Ref Rng & Units 11/22/2021  ?  2:00 PM  ?Serum Protein Electrophoresis  ?Total Protein 6.1 - 8.1 g/dL 7.4    ? ?Assessment/Plan:  ?Demonstrated proper injection technique with Taltz demo device  Patient able to demonstrate proper injection technique using the teach back method.  Patient self injected in the right and left thigh with: ? ?[facility]-supplied Medication: Taltz '80mg'$ /ml autoinjector x 2 pens = '160mg'$  total ?Lot: [id number] ?Expiration: 08/08/2023 ? ?Patient tolerated well.  Observed for 30 mins in office for adverse reaction and none noted.  ? ?Patient is to return in 1 month for labs and 6-8 weeks for follow-up appointment.  Standing orders placed.  ? ?Taltz approved through insurance .   Rx sent to: [facility]: [phone] .  Patient provided with pharmacy phone number and advised that they will call to schedule next shipment. ? ?He will continue: '160mg'$  at Week 0 (administered in clinic today), then '80mg'$  at  Weeks 2 (provided with autoinjector from pharmacy today), 4, 6, 8, 10, and 12 then '80mg'$  SQ every 4 weeks thereafter ? ?All questions encouraged and answered.  Instructed patient to call with any further questions or concerns. ? ?Knox Saliva, PharmD, MPH, BCPS ?Clinical Pharmacist (Rheumatology and Pulmonology) ? ?03/05/2022 8:05 AM ?

## 2022-03-05 NOTE — Patient Instructions (Signed)
Your next TALTZ dose is due on 03/19/22, 04/02/22, 04/16/22, 04/30/22, 05/14/22, 05/28/22, then every 4 weeks thereafter (starting on 06/25/22) ? ?HOLD TALTZ if you have signs or symptoms of an infection. You can resume once you feel better or back to your baseline. ?HOLD TALTZ if you start antibiotics to treat an infection. ?HOLD TALTZ around the time of surgery/procedures. Your surgeon will be able to provide recommendations on when to hold BEFORE and when you are cleared to Ricardo [name]. ? ?Pharmacy information: ?Your prescription will be shipped from [facility]. ?Their phone number is [phone] ?The pharmacy will call you to schedule shipment in about 3 weeks ? ?Labs are due in 1 month then every 3 months. ?Lab hours are from Monday to Thursday 1:30-4:30pm and Friday 1:30-4pm. You do not need an appointment if you come for labs during these times. ? ?How to manage an injection site reaction: ?Remember the 5 C's: ?COUNTER - leave on the counter at least 30 minutes but up to overnight to bring medication to room temperature. This may help prevent stinging ?COLD - place something cold (like an ice gel pack or cold water bottle) on the injection site just before cleansing with alcohol. This may help reduce pain ?CLARITIN - use Claritin (generic name is loratadine) for the first two weeks of treatment or the day of, the day before, and the day after injecting. This will help to minimize injection site reactions ?CORTISONE CREAM - apply if injection site is irritated and itching ?CALL ME - if injection site reaction is bigger than the size of your fist, looks infected, blisters, or if you develop hives ?

## 2022-03-07 ENCOUNTER — Encounter: Payer: Self-pay | Admitting: Rheumatology

## 2022-03-11 ENCOUNTER — Other Ambulatory Visit (HOSPITAL_COMMUNITY): Payer: Self-pay

## 2022-03-11 MED ORDER — PANTOPRAZOLE SODIUM 40 MG PO TBEC
DELAYED_RELEASE_TABLET | ORAL | 4 refills | Status: DC
  Filled 2022-03-11: qty 90, 90d supply, fill #0

## 2022-03-17 ENCOUNTER — Other Ambulatory Visit (HOSPITAL_COMMUNITY): Payer: Self-pay

## 2022-03-20 ENCOUNTER — Other Ambulatory Visit (HOSPITAL_COMMUNITY): Payer: Self-pay

## 2022-03-24 ENCOUNTER — Encounter: Payer: Self-pay | Admitting: Rheumatology

## 2022-03-25 ENCOUNTER — Other Ambulatory Visit (HOSPITAL_COMMUNITY): Payer: Self-pay

## 2022-03-27 ENCOUNTER — Other Ambulatory Visit (HOSPITAL_COMMUNITY): Payer: Self-pay

## 2022-04-04 DIAGNOSIS — H524 Presbyopia: Secondary | ICD-10-CM | POA: Diagnosis not present

## 2022-04-04 DIAGNOSIS — H5203 Hypermetropia, bilateral: Secondary | ICD-10-CM | POA: Diagnosis not present

## 2022-04-21 ENCOUNTER — Other Ambulatory Visit (HOSPITAL_COMMUNITY): Payer: Self-pay

## 2022-04-28 ENCOUNTER — Other Ambulatory Visit (HOSPITAL_COMMUNITY): Payer: Self-pay

## 2022-04-29 ENCOUNTER — Other Ambulatory Visit (HOSPITAL_COMMUNITY): Payer: Self-pay

## 2022-05-12 ENCOUNTER — Other Ambulatory Visit (HOSPITAL_COMMUNITY): Payer: Self-pay

## 2022-05-19 ENCOUNTER — Encounter: Payer: Self-pay | Admitting: Rheumatology

## 2022-05-19 ENCOUNTER — Other Ambulatory Visit: Payer: Self-pay | Admitting: *Deleted

## 2022-05-19 DIAGNOSIS — Z9225 Personal history of immunosupression therapy: Secondary | ICD-10-CM

## 2022-05-19 DIAGNOSIS — Z79899 Other long term (current) drug therapy: Secondary | ICD-10-CM

## 2022-05-19 DIAGNOSIS — Z111 Encounter for screening for respiratory tuberculosis: Secondary | ICD-10-CM

## 2022-05-21 ENCOUNTER — Other Ambulatory Visit: Payer: Self-pay | Admitting: Pharmacist

## 2022-05-21 ENCOUNTER — Other Ambulatory Visit: Payer: Self-pay | Admitting: *Deleted

## 2022-05-21 ENCOUNTER — Other Ambulatory Visit (HOSPITAL_COMMUNITY): Payer: Self-pay

## 2022-05-21 DIAGNOSIS — Z79899 Other long term (current) drug therapy: Secondary | ICD-10-CM | POA: Diagnosis not present

## 2022-05-21 DIAGNOSIS — L409 Psoriasis, unspecified: Secondary | ICD-10-CM

## 2022-05-21 DIAGNOSIS — Z111 Encounter for screening for respiratory tuberculosis: Secondary | ICD-10-CM | POA: Diagnosis not present

## 2022-05-21 DIAGNOSIS — Z9225 Personal history of immunosupression therapy: Secondary | ICD-10-CM

## 2022-05-21 DIAGNOSIS — L4050 Arthropathic psoriasis, unspecified: Secondary | ICD-10-CM

## 2022-05-21 MED ORDER — TALTZ 80 MG/ML ~~LOC~~ SOAJ
80.0000 mg | SUBCUTANEOUS | 1 refills | Status: DC
  Filled 2022-05-21: qty 1, 28d supply, fill #0

## 2022-05-22 NOTE — Progress Notes (Signed)
CBC and CMP are normal.

## 2022-05-24 LAB — QUANTIFERON-TB GOLD PLUS
Mitogen-NIL: >10 IU/mL
NIL: 0.03 IU/mL
QuantiFERON-TB Gold Plus: NEGATIVE
TB1-NIL: 0.1 IU/mL
TB2-NIL: 0.04 IU/mL

## 2022-05-24 LAB — COMPLETE METABOLIC PANEL WITHOUT GFR
AG Ratio: 1.5 (calc) (ref 1.0–2.5)
ALT: 19 U/L (ref 9–46)
AST: 19 U/L (ref 10–35)
Albumin: 4.3 g/dL (ref 3.6–5.1)
Alkaline phosphatase (APISO): 71 U/L (ref 35–144)
BUN: 11 mg/dL (ref 7–25)
CO2: 27 mmol/L (ref 20–32)
Calcium: 9.1 mg/dL (ref 8.6–10.3)
Chloride: 104 mmol/L (ref 98–110)
Creat: 0.73 mg/dL (ref 0.70–1.35)
Globulin: 2.9 g/dL (ref 1.9–3.7)
Glucose, Bld: 89 mg/dL (ref 65–99)
Potassium: 4.2 mmol/L (ref 3.5–5.3)
Sodium: 141 mmol/L (ref 135–146)
Total Bilirubin: 0.3 mg/dL (ref 0.2–1.2)
Total Protein: 7.2 g/dL (ref 6.1–8.1)
eGFR: 101 mL/min/1.73m2

## 2022-05-24 LAB — CBC WITH DIFFERENTIAL/PLATELET
Absolute Monocytes: 502 {cells}/uL (ref 200–950)
Basophils Absolute: 91 {cells}/uL (ref 0–200)
Basophils Relative: 1.2 %
Eosinophils Absolute: 547 {cells}/uL — ABNORMAL HIGH (ref 15–500)
Eosinophils Relative: 7.2 %
HCT: 40.1 % (ref 38.5–50.0)
Hemoglobin: 13.5 g/dL (ref 13.2–17.1)
Lymphs Abs: 2250 {cells}/uL (ref 850–3900)
MCH: 30.9 pg (ref 27.0–33.0)
MCHC: 33.7 g/dL (ref 32.0–36.0)
MCV: 91.8 fL (ref 80.0–100.0)
MPV: 11.1 fL (ref 7.5–12.5)
Monocytes Relative: 6.6 %
Neutro Abs: 4210 {cells}/uL (ref 1500–7800)
Neutrophils Relative %: 55.4 %
Platelets: 250 Thousand/uL (ref 140–400)
RBC: 4.37 Million/uL (ref 4.20–5.80)
RDW: 12 % (ref 11.0–15.0)
Total Lymphocyte: 29.6 %
WBC: 7.6 Thousand/uL (ref 3.8–10.8)

## 2022-05-25 NOTE — Progress Notes (Signed)
TB Gold is negative.

## 2022-05-26 ENCOUNTER — Other Ambulatory Visit (HOSPITAL_COMMUNITY): Payer: Self-pay

## 2022-06-04 ENCOUNTER — Other Ambulatory Visit: Payer: Self-pay | Admitting: Pharmacist

## 2022-06-04 ENCOUNTER — Other Ambulatory Visit (HOSPITAL_COMMUNITY): Payer: Self-pay

## 2022-06-04 DIAGNOSIS — Z79899 Other long term (current) drug therapy: Secondary | ICD-10-CM

## 2022-06-04 DIAGNOSIS — L4050 Arthropathic psoriasis, unspecified: Secondary | ICD-10-CM

## 2022-06-04 DIAGNOSIS — L409 Psoriasis, unspecified: Secondary | ICD-10-CM

## 2022-06-04 MED ORDER — TALTZ 80 MG/ML ~~LOC~~ SOAJ
80.0000 mg | SUBCUTANEOUS | 0 refills | Status: DC
  Filled 2022-06-04 – 2022-06-17 (×2): qty 1, 28d supply, fill #0

## 2022-06-04 NOTE — Telephone Encounter (Signed)
Rx for Taltz '60mg'$  SQ every 4 weeks sent to [facility] for hospital-based cost pricing.   Total qty remaining is 1 ml  Knox Saliva, PharmD, MPH, BCPS, CPP Clinical Pharmacist (Rheumatology and Pulmonology)

## 2022-06-06 ENCOUNTER — Other Ambulatory Visit (HOSPITAL_COMMUNITY): Payer: Self-pay

## 2022-06-17 ENCOUNTER — Other Ambulatory Visit (HOSPITAL_COMMUNITY): Payer: Self-pay

## 2022-06-21 ENCOUNTER — Other Ambulatory Visit (HOSPITAL_COMMUNITY): Payer: Self-pay

## 2022-06-26 ENCOUNTER — Other Ambulatory Visit (HOSPITAL_COMMUNITY): Payer: Self-pay

## 2022-06-27 ENCOUNTER — Telehealth: Payer: Self-pay | Admitting: Pharmacist

## 2022-06-27 NOTE — Telephone Encounter (Addendum)
CMM submitted for Taltz as patient's prior authorization expires BEFORE next office visit note. Submitted to [place]. (Patient fills w [facility])  Knox Saliva, PharmD, MPH, BCPS, CPP Clinical Pharmacist (Rheumatology and Pulmonology)

## 2022-06-27 NOTE — Telephone Encounter (Signed)
Received PA renewal request from [facility]. Patient has OV on 08/13/22. We don't have any updated OV notes to attach for PA renewal so unable to answer clinicals. Will save authorization for f/u on 08/13/22  Key: [id number]  Knox Saliva, PharmD, MPH, BCPS, CPP Clinical Pharmacist (Rheumatology and Pulmonology)

## 2022-06-30 ENCOUNTER — Other Ambulatory Visit (HOSPITAL_COMMUNITY): Payer: Self-pay

## 2022-07-02 DIAGNOSIS — G4733 Obstructive sleep apnea (adult) (pediatric): Secondary | ICD-10-CM | POA: Diagnosis not present

## 2022-07-09 NOTE — Telephone Encounter (Signed)
Previous authorization submitted for Cosentyx mistakenly. Resubmitted for Taltz to [place] via [place]. Current authorization expires on 07/21/22 for maintenance dose.  Key: [id number]  Knox Saliva, PharmD, MPH, BCPS, CPP Clinical Pharmacist (Rheumatology and Pulmonology)

## 2022-07-14 ENCOUNTER — Other Ambulatory Visit (HOSPITAL_COMMUNITY): Payer: Self-pay

## 2022-07-14 NOTE — Telephone Encounter (Signed)
Received a fax regarding Prior Authorization from [facility] for Ricardo [name]. Authorization has been DENIED because there need to be chart notes indicating improvement since starting Taltz. Denial letter sent to patient's media tab in chart.  His OV is on 08/13/22. Left note in [place] for [facility] to process refill early before 07/21/22 so it will be covered. Will be able to resubmit prior auth renewal for Taltz after OV on 08/13/22 and hopefully prevent any interruption in therapy.  Knox Saliva, PharmD, MPH, BCPS, CPP Clinical Pharmacist (Rheumatology and Pulmonology)

## 2022-07-15 ENCOUNTER — Other Ambulatory Visit: Payer: Self-pay | Admitting: Rheumatology

## 2022-07-15 ENCOUNTER — Other Ambulatory Visit (HOSPITAL_COMMUNITY): Payer: Self-pay

## 2022-07-15 DIAGNOSIS — L409 Psoriasis, unspecified: Secondary | ICD-10-CM

## 2022-07-15 DIAGNOSIS — Z79899 Other long term (current) drug therapy: Secondary | ICD-10-CM

## 2022-07-15 DIAGNOSIS — L4050 Arthropathic psoriasis, unspecified: Secondary | ICD-10-CM

## 2022-07-15 MED ORDER — TALTZ 80 MG/ML ~~LOC~~ SOAJ
80.0000 mg | SUBCUTANEOUS | 0 refills | Status: DC
  Filled 2022-07-15: qty 1, 28d supply, fill #0
  Filled 2022-08-07: qty 1, 28d supply, fill #1
  Filled 2022-09-10: qty 1, 28d supply, fill #2

## 2022-07-15 NOTE — Telephone Encounter (Signed)
Next Visit: 08/13/2022  Last Visit: 02/05/2022  Last Fill: 06/04/2022 (30 day supply)  RT:QSYHNPMVA arthritis   Current Dose per office note 02/05/2022: Taltz 80 mg every 28 days   Labs: 05/21/2022 CBC and CMP are normal.  TB Gold: 05/21/2022 Neg    Okay to refill Taltz?

## 2022-07-17 ENCOUNTER — Other Ambulatory Visit (HOSPITAL_COMMUNITY): Payer: Self-pay

## 2022-07-21 ENCOUNTER — Other Ambulatory Visit (HOSPITAL_COMMUNITY): Payer: Self-pay

## 2022-07-30 ENCOUNTER — Other Ambulatory Visit (HOSPITAL_COMMUNITY): Payer: Self-pay

## 2022-07-30 MED ORDER — AMOXICILLIN 875 MG PO TABS
ORAL_TABLET | ORAL | 1 refills | Status: DC
  Filled 2022-07-30: qty 20, 10d supply, fill #0

## 2022-07-30 MED ORDER — AMOXICILLIN-POT CLAVULANATE 500-125 MG PO TABS
1.0000 | ORAL_TABLET | Freq: Three times a day (TID) | ORAL | 0 refills | Status: DC
  Filled 2022-07-30: qty 21, 7d supply, fill #0

## 2022-07-30 NOTE — Progress Notes (Signed)
Office Visit Note  Patient: Ricardo [name]             Date of Birth: 1956-06-29           MRN: [id number]             PCP: Shon Baton, MD Referring: Shon Baton, MD Visit Date: 08/13/2022 Occupation: '@GUAROCC'$ @  Subjective:  Medication management  History of Present Illness: Ricardo [name] is a 66 y.o. male with history of psoriatic arthritis and psoriasis.  He has been on Taltz since Mar 05, 2022.  He states that he has been doing well on Taltz.  He denies any joint pain or joint swelling.  He states he had 1 time some stiffness in his thumb which resolved.  He denies any history of Achilles tendinitis, Planter fasciitis, uveitis or psoriasis.  He used to have some discomfort in the TMJ and his costochondral junction which has completely resolved.  He has been exercising on a regular basis.  He does aerobics and weight training.  Activities of Daily Living:  Patient reports morning stiffness for 0 minutes.   Patient Denies nocturnal pain.  Difficulty dressing/grooming: Denies Difficulty climbing stairs: Denies Difficulty getting out of chair: Denies Difficulty using hands for taps, buttons, cutlery, and/or writing: Denies  Review of Systems  Constitutional:  Negative for fatigue.  HENT:  Negative for mouth sores and mouth dryness.   Eyes:  Negative for dryness.  Respiratory:  Negative for shortness of breath.   Cardiovascular:  Negative for chest pain and palpitations.  Gastrointestinal:  Negative for blood in stool, constipation and diarrhea.  Endocrine: Negative for increased urination.  Genitourinary:  Negative for involuntary urination.  Musculoskeletal:  Negative for joint pain, gait problem, joint pain, joint swelling, myalgias, muscle weakness, morning stiffness, muscle tenderness and myalgias.  Skin:  Negative for color change, rash, hair loss and sensitivity to sunlight.  Allergic/Immunologic: Negative for susceptible to infections.  Neurological:  Negative for dizziness  and headaches.  Hematological:  Negative for swollen glands.  Psychiatric/Behavioral:  Negative for depressed mood and sleep disturbance. The patient is not nervous/anxious.     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   History of seizures as a child 07/21/2019   Snoring 07/21/2019   History of coronary artery disease 11/19/2018   Current use of anticoagulant therapy 11/19/2018   Psoriasis 11/19/2018   Psoriatic arthritis ([place]) 11/19/2018   High risk medication use 11/19/2018   DDD (degenerative disc disease), cervical 11/19/2018   Encounter for medication review 05/13/2018    Past Medical History:  Diagnosis Date   Coronary heart disease    2 stent placed March 2019   DDD (degenerative disc disease), cervical 11/19/2018   Difficult intubation    pt stated small airway   H/O heart artery stent    History of coronary artery disease 11/19/2018   Status post stent placement March 2019   History of seizures    No seizures in over 20 years.    Psoriasis 11/19/2018   clobetasol   Psoriatic arthritis ([place])    On Cosentyx since 2017    Family History  Problem Relation Age of Onset   Heart disease Father    Past Surgical History:  Procedure Laterality Date   COLONOSCOPY  2007 & 2016   Normal   COLONOSCOPY WITH PROPOFOL N/A 05/07/2021   Procedure: COLONOSCOPY WITH  PROPOFOL;  Surgeon: Juanita Craver, MD;  Location: [facility];  Service: Endoscopy;  Laterality: N/A;   CYSTOSCOPY Right 2009   Removal of Ureteric Stone   CYSTOSCOPY  12/2019   ESOPHAGOGASTRODUODENOSCOPY (EGD) WITH PROPOFOL N/A 05/07/2021   Procedure: ESOPHAGOGASTRODUODENOSCOPY (EGD) WITH PROPOFOL;  Surgeon: Juanita Craver, MD;  Location: [facility];  Service: Endoscopy;  Laterality: N/A;   HEMORRHOID SURGERY  2004   External hemorrhoid removed    HERNIA REPAIR     INGUINAL HERNIA REPAIR  1976   PILONIDAL CYST /  SINUS EXCISION  2001   [place]   and biopsy   UPPER GI ENDOSCOPY  2007 & 2016   Normal   Social History   Social History Narrative   Right handed   3 cups coffee per day       Immunization History  Administered Date(s) Administered   PFIZER(Purple Top)SARS-COV-2 Vaccination 10/23/2019, 11/14/2019, 07/13/2020, 07/21/2021     Objective: Vital Signs: BP 112/71 (BP Location: Left Arm, Patient Position: Sitting, Cuff Size: Normal)   Pulse 76   Ht '5\' 9"'$  (1.753 m)   Wt 150 lb 3.2 oz (68.1 kg)   BMI 22.18 kg/m    Physical Exam Vitals and nursing note reviewed.  Constitutional:      Appearance: He is well-developed.  HENT:     Head: Normocephalic and atraumatic.  Eyes:     Conjunctiva/sclera: Conjunctivae normal.     Pupils: Pupils are equal, round, and reactive to light.  Cardiovascular:     Rate and Rhythm: Normal rate and regular rhythm.     Heart sounds: Normal heart sounds.  Pulmonary:     Effort: Pulmonary effort is normal.     Breath sounds: Normal breath sounds.  Abdominal:     General: Bowel sounds are normal.     Palpations: Abdomen is soft.  Musculoskeletal:     Cervical back: Normal range of motion and neck supple.  Skin:    General: Skin is warm and dry.     Capillary Refill: Capillary refill takes less than 2 seconds.  Neurological:     Mental Status: He is alert and oriented to person, place, and time.  Psychiatric:        Behavior: Behavior normal.      Musculoskeletal Exam: Cervical, thoracic and lumbar spine were in good range of motion.  He had no SI joint tenderness.  Shoulder joints, elbow joints, wrist joints with good range of motion.  He had thickening of his left second MCP joint and right fifth PIP joint with limited extension.  No active synovitis was noted.  Hip joints and knee joints with good range of motion.  He had no tenderness over ankles or MTPs.  There was no planter fasciitis or Achilles tendinitis.  CDAI Exam: CDAI  Score: -- Patient Global: --; Provider Global: -- Swollen: --; Tender: -- Joint Exam 08/13/2022   No joint exam has been documented for this visit   There is currently no information documented on the homunculus. Go to the Rheumatology activity and complete the homunculus joint exam.  Investigation: No additional findings.  Imaging: No results found.  Recent Labs: Lab Results  Component Value Date   WBC 7.6 05/21/2022   HGB 13.5 05/21/2022   PLT 250 05/21/2022   NA 141 05/21/2022   K 4.2 05/21/2022   CL 104 05/21/2022   CO2 27 05/21/2022   GLUCOSE 89 05/21/2022   BUN 11 05/21/2022   CREATININE 0.73 05/21/2022  BILITOT 0.3 05/21/2022   ALKPHOS 79 01/13/2020   AST 19 05/21/2022   ALT 19 05/21/2022   PROT 7.2 05/21/2022   ALBUMIN 4.4 01/13/2020   CALCIUM 9.1 05/21/2022   GFRAA 109 05/10/2021   QFTBGOLDPLUS NEGATIVE 05/21/2022    Speciality Comments: Inadequate response to methotrexate, Enbrel, Humira Cosyntex November 2017-April 2023 Taltz-03/05/22  Procedures:  No procedures performed Allergies: Patient has no known allergies.   Assessment / Plan:     Visit Diagnoses: Psoriatic arthritis ([place]) - diagnosed in 1999, treated by Dr. Kerry Hough in [place] -he had inadequate response to methotrexate, Enbrel, Humira.  He had been on Cosentyx since November 2017 which eventually stopped working.  He was started on Taltz on Mar 05, 2022.  He has been taking Taltz 80 mg subcu every 28 days without any side effects.  He had no synovitis on examination.  He denies any history of flares.  The discomfort he is to experience in the TMJ and costochondral region has resolved.  Psoriasis-no active psoriasis lesions.  High risk medication use - Taltz 80 mg subcu every 28 days started on Mar 05, 2022.  Previous treatment methotrexate, Enbrel, Humira, Cosentyx.  Labs obtained on May 21, 2022 were within normal limits.  TB gold was negative on May 21, 2022.  He was advised to get labs in  October and every 3 months.  He plans to get labs at Dr. Keane Police office tomorrow.  I advised him to get CBC with differential and CMP with GFR.  Information regarding immunization was placed in the AVS.  He was also advised to hold Taltz if he develops an infection.  Arthritis of carpometacarpal (CMC) joint of right thumb-has intermittent discomfort.  Current use of anticoagulant therapy-he is on Plavix.  History of coronary artery disease - Status post stent placement March 2019.  Please risk of heart disease with psoriatic arthritis was discussed.  Dietary modifications and Daily exercise was emphasized.  Gastroesophageal reflux disease with esophagitis without hemorrhage - EGD and colonoscopy were negative in 07/05/ 2022.  His GI symptoms resolved.  He occasionally takes pantoprazole.  Obstructive sleep apnea syndrome - on CPAP  History of vitamin D deficiency-he is currently not taking vitamin D.  His vitamin D level was 48 on November 30, 2021.  I advised him to get vitamin D level with [name].  If his vitamin D level is normal.  Take vitamin D 1000 units daily.  Orders: No orders of the defined types were placed in this encounter.  No orders of the defined types were placed in this encounter.    Follow-Up Instructions: Return for Psoriatic arthritis.   Bo Merino, MD  Note - This record has been created using Editor, commissioning.  Chart creation errors have been sought, but may not always  have been located. Such creation errors do not reflect on  the standard of medical care.

## 2022-08-07 ENCOUNTER — Other Ambulatory Visit (HOSPITAL_COMMUNITY): Payer: Self-pay

## 2022-08-12 ENCOUNTER — Other Ambulatory Visit (HOSPITAL_COMMUNITY): Payer: Self-pay

## 2022-08-13 ENCOUNTER — Encounter: Payer: Self-pay | Admitting: Rheumatology

## 2022-08-13 ENCOUNTER — Ambulatory Visit: Payer: 59 | Attending: Rheumatology | Admitting: Rheumatology

## 2022-08-13 ENCOUNTER — Other Ambulatory Visit (HOSPITAL_COMMUNITY): Payer: Self-pay

## 2022-08-13 VITALS — BP 112/71 | HR 76 | Ht 69.0 in | Wt 150.2 lb

## 2022-08-13 DIAGNOSIS — K2100 Gastro-esophageal reflux disease with esophagitis, without bleeding: Secondary | ICD-10-CM | POA: Diagnosis not present

## 2022-08-13 DIAGNOSIS — L4050 Arthropathic psoriasis, unspecified: Secondary | ICD-10-CM

## 2022-08-13 DIAGNOSIS — Z79899 Other long term (current) drug therapy: Secondary | ICD-10-CM

## 2022-08-13 DIAGNOSIS — L409 Psoriasis, unspecified: Secondary | ICD-10-CM

## 2022-08-13 DIAGNOSIS — M1811 Unilateral primary osteoarthritis of first carpometacarpal joint, right hand: Secondary | ICD-10-CM

## 2022-08-13 DIAGNOSIS — G4733 Obstructive sleep apnea (adult) (pediatric): Secondary | ICD-10-CM

## 2022-08-13 DIAGNOSIS — Z7901 Long term (current) use of anticoagulants: Secondary | ICD-10-CM

## 2022-08-13 DIAGNOSIS — Z8679 Personal history of other diseases of the circulatory system: Secondary | ICD-10-CM | POA: Diagnosis not present

## 2022-08-13 DIAGNOSIS — Z8639 Personal history of other endocrine, nutritional and metabolic disease: Secondary | ICD-10-CM | POA: Diagnosis not present

## 2022-08-13 NOTE — Patient Instructions (Signed)
Standing Labs We placed an order today for your standing lab work.   Please have your standing labs ( CBC with diff and CMP with GFR)drawn in October and every 3 months  Please get vitamin D level checked with your next labs.  Please have your labs drawn 2 weeks prior to your appointment so that the provider can discuss your lab results at your appointment.  Please note that you may see your imaging and lab results in [place] before we have reviewed them. We will contact you once all results are reviewed. Please allow our office up to 72 hours to thoroughly review all of the results before contacting the office for clarification of your results.  Lab hours are: Monday through Thursday from 1:30 pm-4:30 pm and Friday from 1:30 pm- 4:00 pm  You may experience shorter wait times on Monday, Thursday or Friday afternoons,.   Effective September 01, 2022, new lab hours will be: Monday through Thursday from 8:00 am -12:30 pm and 1:00 pm-5:00 pm and Friday from 8:00 am-12:00 pm.  Please be advised, all patients with office appointments requiring lab work will take precedent over walk-in lab work.   Labs are drawn by [facility]. Please bring your co-pay at the time of your lab draw.  You may receive a bill from [facility] for your lab work.  Please note if you are on Hydroxychloroquine and and an order has been placed for a Hydroxychloroquine level, you will need to have it drawn 4 hours or more after your last dose.  If you wish to have your labs drawn at another location, please call the office 24 hours in advance so we can fax the orders.  The office is located at [address], [place], [place], [place] [address] No appointment is necessary.    If you have any questions regarding directions or hours of operation,  please call [phone].   As a reminder, please drink plenty of water prior to coming for your lab work. Thanks!   Vaccines You are taking a medication(s) that can suppress your  immune system.  The following immunizations are recommended: Flu annually Covid-19  Td/Tdap (tetanus, diphtheria, pertussis) every 10 years Pneumonia (Prevnar 15 then Pneumovax 23 at least 1 year apart.  Alternatively, can take Prevnar 20 without needing additional dose) Shingrix: 2 doses from 4 weeks to 6 months apart  Please check with your PCP to make sure you are up to date.   If you have signs or symptoms of an infection or start antibiotics: First, call your PCP for workup of your infection. Hold your medication through the infection, until you complete your antibiotics, and until symptoms resolve if you take the following: Injectable medication (Actemra, Benlysta, Cimzia, Cosentyx, Enbrel, Humira, Kevzara, Orencia, Remicade, Simponi, Stelara, Taltz, Tremfya) Methotrexate Leflunomide (Arava) Mycophenolate (Cellcept) Morrie Sheldon, Olumiant, or Rinvoq

## 2022-08-13 NOTE — Telephone Encounter (Signed)
Submitted a Prior Authorization request to [facility] for TALTZ via CoverMyMeds. Will update once we receive a response.  Key: [id number]  Knox Saliva, PharmD, MPH, BCPS, CPP Clinical Pharmacist (Rheumatology and Pulmonology)

## 2022-08-13 NOTE — Telephone Encounter (Signed)
Received notification from [facility] regarding a prior authorization for Ricardo [name]. Authorization has been APPROVED from 08/13/22 to 08/13/23.   Patient must continue to fill through [place]: [phone]   Authorization # [id number]  [place] updated  Knox Saliva, PharmD, MPH, BCPS, CPP Clinical Pharmacist (Rheumatology and Pulmonology)

## 2022-08-15 DIAGNOSIS — E785 Hyperlipidemia, unspecified: Secondary | ICD-10-CM | POA: Diagnosis not present

## 2022-08-15 DIAGNOSIS — E559 Vitamin D deficiency, unspecified: Secondary | ICD-10-CM | POA: Diagnosis not present

## 2022-08-15 DIAGNOSIS — Z125 Encounter for screening for malignant neoplasm of prostate: Secondary | ICD-10-CM | POA: Diagnosis not present

## 2022-08-15 DIAGNOSIS — R7989 Other specified abnormal findings of blood chemistry: Secondary | ICD-10-CM | POA: Diagnosis not present

## 2022-08-18 ENCOUNTER — Other Ambulatory Visit (HOSPITAL_COMMUNITY): Payer: Self-pay

## 2022-08-20 ENCOUNTER — Other Ambulatory Visit (HOSPITAL_COMMUNITY): Payer: Self-pay

## 2022-08-22 DIAGNOSIS — R82998 Other abnormal findings in urine: Secondary | ICD-10-CM | POA: Diagnosis not present

## 2022-08-22 DIAGNOSIS — Z8611 Personal history of tuberculosis: Secondary | ICD-10-CM | POA: Diagnosis not present

## 2022-08-22 DIAGNOSIS — E785 Hyperlipidemia, unspecified: Secondary | ICD-10-CM | POA: Diagnosis not present

## 2022-08-22 DIAGNOSIS — Z23 Encounter for immunization: Secondary | ICD-10-CM | POA: Diagnosis not present

## 2022-08-22 DIAGNOSIS — R319 Hematuria, unspecified: Secondary | ICD-10-CM | POA: Diagnosis not present

## 2022-08-22 DIAGNOSIS — G4733 Obstructive sleep apnea (adult) (pediatric): Secondary | ICD-10-CM | POA: Diagnosis not present

## 2022-08-22 DIAGNOSIS — Z1331 Encounter for screening for depression: Secondary | ICD-10-CM | POA: Diagnosis not present

## 2022-08-22 DIAGNOSIS — L4050 Arthropathic psoriasis, unspecified: Secondary | ICD-10-CM | POA: Diagnosis not present

## 2022-08-22 DIAGNOSIS — D8989 Other specified disorders involving the immune mechanism, not elsewhere classified: Secondary | ICD-10-CM | POA: Diagnosis not present

## 2022-08-22 DIAGNOSIS — Z0000 Encounter for general adult medical examination without abnormal findings: Secondary | ICD-10-CM | POA: Diagnosis not present

## 2022-08-22 DIAGNOSIS — M180 Bilateral primary osteoarthritis of first carpometacarpal joints: Secondary | ICD-10-CM | POA: Diagnosis not present

## 2022-08-22 DIAGNOSIS — I2510 Atherosclerotic heart disease of native coronary artery without angina pectoris: Secondary | ICD-10-CM | POA: Diagnosis not present

## 2022-09-03 ENCOUNTER — Other Ambulatory Visit (HOSPITAL_BASED_OUTPATIENT_CLINIC_OR_DEPARTMENT_OTHER): Payer: Self-pay

## 2022-09-03 MED ORDER — COMIRNATY 30 MCG/0.3ML IM SUSY
PREFILLED_SYRINGE | INTRAMUSCULAR | 0 refills | Status: DC
  Filled 2022-09-03: qty 0.3, 1d supply, fill #0

## 2022-09-10 ENCOUNTER — Other Ambulatory Visit (HOSPITAL_COMMUNITY): Payer: Self-pay

## 2022-09-18 ENCOUNTER — Other Ambulatory Visit (HOSPITAL_COMMUNITY): Payer: Self-pay

## 2022-09-22 ENCOUNTER — Other Ambulatory Visit (HOSPITAL_COMMUNITY): Payer: Self-pay

## 2022-10-09 ENCOUNTER — Other Ambulatory Visit: Payer: Self-pay | Admitting: Rheumatology

## 2022-10-09 ENCOUNTER — Encounter: Payer: Self-pay | Admitting: Rheumatology

## 2022-10-09 ENCOUNTER — Other Ambulatory Visit (HOSPITAL_COMMUNITY): Payer: Self-pay

## 2022-10-09 DIAGNOSIS — Z79899 Other long term (current) drug therapy: Secondary | ICD-10-CM

## 2022-10-09 DIAGNOSIS — L409 Psoriasis, unspecified: Secondary | ICD-10-CM

## 2022-10-09 DIAGNOSIS — L4050 Arthropathic psoriasis, unspecified: Secondary | ICD-10-CM

## 2022-10-09 MED ORDER — TALTZ 80 MG/ML ~~LOC~~ SOAJ
80.0000 mg | SUBCUTANEOUS | 0 refills | Status: DC
  Filled 2022-10-09: qty 3, 84d supply, fill #0
  Filled 2022-10-15: qty 1, 28d supply, fill #0
  Filled 2022-11-07: qty 1, 28d supply, fill #1
  Filled 2022-12-12: qty 1, 28d supply, fill #2

## 2022-10-09 NOTE — Telephone Encounter (Signed)
Next Visit: 02/18/2023  Last Visit: 08/13/2022  Last Fill: 07/15/2022  OK:[id number] arthritis   Current Dose per office note on 08/13/2022: Taltz 80 mg subcu every 28 days   Labs: 05/21/2022 CBC and CMP are normal.   TB Gold: 05/21/2022 negative    Advised patient that he is due to update labs. Patient states he had them drawn with his PCP in November and will upload results to mychart.   Okay to refill taltz?

## 2022-10-14 ENCOUNTER — Other Ambulatory Visit: Payer: Self-pay | Admitting: *Deleted

## 2022-10-14 NOTE — Telephone Encounter (Signed)
Labs received from:[facility]   Drawn on:08/15/2022  Reviewed by:Dr. Bo Merino  Labs drawn:CBC, CMP, Lipid, Vitamin D, PSA, TSH, UA  Results:EOS 0.5  MCH 32.3  HDL 35  LDL/HDL Ratio 1.5  Vitamin D 20.2  Patient is on Taltz 80 mg SQ every 28 days. Patient is taking Vitamin D 4,000 units daily as advised by PCP.

## 2022-10-15 ENCOUNTER — Other Ambulatory Visit (HOSPITAL_COMMUNITY): Payer: Self-pay

## 2022-10-15 ENCOUNTER — Other Ambulatory Visit: Payer: Self-pay

## 2022-10-17 ENCOUNTER — Other Ambulatory Visit: Payer: Self-pay

## 2022-10-20 ENCOUNTER — Other Ambulatory Visit: Payer: Self-pay

## 2022-10-20 DIAGNOSIS — Z85828 Personal history of other malignant neoplasm of skin: Secondary | ICD-10-CM | POA: Diagnosis not present

## 2022-10-20 DIAGNOSIS — L400 Psoriasis vulgaris: Secondary | ICD-10-CM | POA: Diagnosis not present

## 2022-10-20 DIAGNOSIS — D1801 Hemangioma of skin and subcutaneous tissue: Secondary | ICD-10-CM | POA: Diagnosis not present

## 2022-10-20 DIAGNOSIS — L738 Other specified follicular disorders: Secondary | ICD-10-CM | POA: Diagnosis not present

## 2022-10-20 DIAGNOSIS — L821 Other seborrheic keratosis: Secondary | ICD-10-CM | POA: Diagnosis not present

## 2022-10-21 ENCOUNTER — Other Ambulatory Visit (HOSPITAL_COMMUNITY): Payer: Self-pay

## 2022-10-21 ENCOUNTER — Other Ambulatory Visit: Payer: Self-pay

## 2022-10-28 DIAGNOSIS — G4733 Obstructive sleep apnea (adult) (pediatric): Secondary | ICD-10-CM | POA: Diagnosis not present

## 2022-11-07 ENCOUNTER — Other Ambulatory Visit (HOSPITAL_COMMUNITY): Payer: Self-pay

## 2022-11-07 MED ORDER — PAXLOVID (300/100) 20 X 150 MG & 10 X 100MG PO TBPK
3.0000 | ORAL_TABLET | Freq: Two times a day (BID) | ORAL | 0 refills | Status: DC
  Filled 2022-11-07: qty 30, 5d supply, fill #0

## 2022-11-07 MED ORDER — CIPROFLOXACIN HCL 500 MG PO TABS
500.0000 mg | ORAL_TABLET | Freq: Two times a day (BID) | ORAL | 0 refills | Status: DC
  Filled 2022-11-07: qty 20, 10d supply, fill #0

## 2022-11-07 MED ORDER — ERYTHROMYCIN 250 MG PO TBEC
500.0000 mg | DELAYED_RELEASE_TABLET | Freq: Two times a day (BID) | ORAL | 0 refills | Status: DC
  Filled 2022-11-07: qty 40, 10d supply, fill #0

## 2022-11-07 MED ORDER — ATOVAQUONE-PROGUANIL HCL 250-100 MG PO TABS
ORAL_TABLET | ORAL | 0 refills | Status: DC
  Filled 2022-11-07: qty 27, 27d supply, fill #0

## 2022-11-08 ENCOUNTER — Other Ambulatory Visit (HOSPITAL_COMMUNITY): Payer: Self-pay

## 2022-11-17 ENCOUNTER — Other Ambulatory Visit (HOSPITAL_COMMUNITY): Payer: Self-pay

## 2022-11-18 ENCOUNTER — Other Ambulatory Visit (HOSPITAL_COMMUNITY): Payer: Self-pay

## 2022-12-10 NOTE — Progress Notes (Unsigned)
Cardiology Office Note   Date:  12/11/2022   ID:  Ricardo, [name] 10/22/1956, MRN [id number]  PCP:  Ricardo Baton, MD    No chief complaint on file.  CAD  Wt Readings from Last 3 Encounters:  12/11/22 156 lb (70.8 kg)  08/13/22 150 lb 3.2 oz (68.1 kg)  02/05/22 150 lb 12.8 oz (68.4 kg)       History of Present Illness: Ricardo [name] is a 67 y.o. male   Angina was an exertional chest fullness.  He had basal cell CA on the face several years ago.   He had exertional angina in early 2019, it was a fullness in his chest that was worse with walking up hills.   Father had CABG at 22.   He had He had an abnormal stress which led to cath.     CABG was considered.  CTO of LAD and severe disease of OM.  Both were stented via the left radial in Feb 2019.  He asked for the left radial to be used.    3.0 x 38 Sierra to LAD postdilated to 3.25 mm. 3.5 x 18 to OM postdilated to 5.0 mm   He has done well since then. He continues to exercise regularly.   He was switched to Plavix only in 02/2019.  Sleep study was ordered.  He did have apnea, and now uses CPAP.  He eats a vegetarian diet.    SHOB got better off of Brilinta. He added aspirin back, but stopped it after some stomach upset.  Has had gastric erosions in the past.   Just came back from [place].  Was running in the [facility] airport to catch a connection without difficulty.    Denies : Chest pain. Dizziness. Nitroglycerin use. Orthopnea. Palpitations. Paroxysmal nocturnal dyspnea. Shortness of breath. Syncope.    Leg edema after long flight.  Has some right hamstring pain with bending.     Past Medical History:  Diagnosis Date   Coronary heart disease    2 stent placed March 2019   DDD (degenerative disc disease), cervical 11/19/2018   Difficult intubation    pt stated small airway   H/O heart artery stent    History of coronary artery disease 11/19/2018   Status post stent placement March 2019   History of seizures    No  seizures in over 20 years.    Psoriasis 11/19/2018   clobetasol   Psoriatic arthritis ([place])    On Cosentyx since 2017    Past Surgical History:  Procedure Laterality Date   COLONOSCOPY  2007 & 2016   Normal   COLONOSCOPY WITH PROPOFOL N/A 05/07/2021   Procedure: COLONOSCOPY WITH PROPOFOL;  Surgeon: Juanita Craver, MD;  Location: [facility];  Service: Endoscopy;  Laterality: N/A;   CYSTOSCOPY Right 2009   Removal of Ureteric Stone   CYSTOSCOPY  12/2019   ESOPHAGOGASTRODUODENOSCOPY (EGD) WITH PROPOFOL N/A 05/07/2021   Procedure: ESOPHAGOGASTRODUODENOSCOPY (EGD) WITH PROPOFOL;  Surgeon: Juanita Craver, MD;  Location: [facility];  Service: Endoscopy;  Laterality: N/A;   HEMORRHOID SURGERY  2004   External hemorrhoid removed    HERNIA REPAIR     INGUINAL HERNIA REPAIR  1976   PILONIDAL CYST / SINUS EXCISION  2001   THORACOTOMY  1997   and biopsy   UPPER GI ENDOSCOPY  2007 & 2016   Normal     Current Outpatient Medications  Medication Sig Dispense Refill   atorvastatin (LIPITOR) 40 MG tablet TAKE 1  TABLET BY MOUTH ONCE A DAY 90 tablet 3   atovaquone-proguanil (MALARONE) 250-100 MG TABS tablet Take 1 tablet by mouth daily. Start 2 days prior to trip and stop 7 days after trip as directed for 27 days total. 27 tablet 0   ciprofloxacin (CIPRO) 500 MG tablet Take 1 tablet (500 mg total) by mouth 2 (two) times daily for 10 days as directed 20 tablet 0   clobetasol cream (TEMOVATE) 9.67 % Apply 1 application topically 2 (two) times daily. (Patient taking differently: Apply 1 application  topically as needed.) 45 g 1   clopidogrel (PLAVIX) 75 MG tablet Take 1 tablet (75 mg total) by mouth daily. 90 tablet 3   Coenzyme Q10 200 MG capsule Take 200 mg by mouth daily.      COVID-19 mRNA vaccine 2023-2024 (COMIRNATY) syringe Inject into the muscle. 0.3 mL 0   Cyanocobalamin (VITAMIN B12) 1000 MCG TBCR Take 1,000 mg by mouth 2 (two) times a week. Monday- Thursday     diclofenac Sodium (VOLTAREN) 1 %  GEL Apply 2-4 grams to affected joint 4 times daily as needed. (Patient not taking: Reported on 02/05/2022) 400 g 2   Erythromycin 250 MG TBEC Take 2 tablets (500 mg total) by mouth 2 (two) times daily for 10 days as directed 40 tablet 0   Ixekizumab (TALTZ) 80 MG/ML SOAJ Inject 80 mg into the skin every 28 (twenty-eight) days. 3 mL 0   loratadine (CLARITIN) 10 MG tablet Take 10 mg by mouth daily as needed for allergies.     metoprolol succinate (TOPROL-XL) 25 MG 24 hr tablet TAKE 1/2 TABLET (12.5 MG TOTAL) BY MOUTH ONCE A DAY 90 tablet 3   Multiple Vitamin (MULTIVITAMIN PO) Take 1 tablet by mouth daily.     nirmatrelvir & ritonavir (PAXLOVID, 300/100,) 20 x 150 MG & 10 x '100MG'$  TBPK Take 3 tablets by mouth 2 (two) times daily for 5 days 30 tablet 0   nitroGLYCERIN (NITROSTAT) 0.4 MG SL tablet Place 1 tablet (0.4 mg total) under the tongue every 5 (five) minutes as needed for chest pain. 25 tablet 3   pantoprazole (PROTONIX) 40 MG tablet Take 1 tablet by mouth once a day (Patient taking differently: Take by mouth as needed.) 90 tablet 4   No current facility-administered medications for this visit.    Allergies:   Patient has no known allergies.    Social History:  The patient  reports that he quit smoking about 31 years ago. His smoking use included cigarettes. He has never been exposed to tobacco smoke. He has never used smokeless tobacco. He reports current alcohol use. He reports that he does not use drugs.   Family History:  The patient's family history includes Heart disease in his father.    ROS:  Please see the history of present illness.   Otherwise, review of systems are positive for ankle edema.   All other systems are reviewed and negative.    PHYSICAL EXAM: VS:  BP 126/78   Pulse 68   Ht '5\' 9"'$  (1.753 m)   Wt 156 lb (70.8 kg)   SpO2 95%   BMI 23.04 kg/m  , BMI Body mass index is 23.04 kg/m. GEN: Well nourished, well developed, in no acute distress HEENT: normal Neck: no  JVD, carotid bruits, or masses Cardiac: RRR; no murmurs, rubs, or gallops,no edema  Respiratory:  clear to auscultation bilaterally, normal work of breathing GI: soft, nontender, nondistended, + BS MS: no deformity or atrophy  Skin: warm and dry, no rash Neuro:  Strength and sensation are intact Psych: euthymic mood, full affect   EKG:   The ekg ordered today demonstrates NSR, no ST segment   Recent Labs: 05/21/2022: ALT 19; BUN 11; Creat 0.73; Hemoglobin 13.5; Platelets 250; Potassium 4.2; Sodium 141   Lipid Panel    Component Value Date/Time   CHOL 102 10/06/2019 0803   TRIG 107 10/06/2019 0803   HDL 37 (L) 10/06/2019 0803   CHOLHDL 2.8 10/06/2019 0803   LDLCALC 45 10/06/2019 0803     Other studies Reviewed: Additional studies/ records that were reviewed today with results demonstrating: Labs reviewed.   ASSESSMENT AND PLAN:  CAD: No angina on medical therapy.  Continue clopidogrel.  No bleeding problems.  Try to get back to his usual exercise routine.  He needs to try to get back to his regular diet.  He had a lot of dietary indiscretion on his recent trip to [place]. Family h/o early CAD: Continue healthy lifestyle.  He needs to increase  Hyperlipidemia: Lipids reviewed from recent check and well-controlled.  LDL 52 in October 2023, HDL 35, triglycerides 109, total cholesterol 109.  He will let us know if he wants to switch to rosuvastatin 20 mg daily. Aortic atherosclerosis: Continue atorvastatin.  We discussed switching to rosuvastatin given his hamstring pain.  I do not think it is related to the statin.  He does not have diffuse muscle pains. Former smoker: He has given up smoking a pipe. Prediabetes: Eats a vegetarian diet.  Almost completely vegan.    Current medicines are reviewed at length with the patient today.  The patient concerns regarding his medicines were addressed.  The following changes have been made:  No change  Labs/ tests ordered today include:  No  orders of the defined types were placed in this encounter.   Recommend 150 minutes/week of aerobic exercise Low fat, low carb, high fiber diet recommended  Disposition:   FU in 1 year   Signed, Larae Grooms, MD  12/11/2022 3:50 PM    [facility] [place], [place], [place]  [address] Phone: [phone]; Fax: [phone]

## 2022-12-11 ENCOUNTER — Encounter: Payer: Self-pay | Admitting: Rheumatology

## 2022-12-11 ENCOUNTER — Encounter: Payer: Self-pay | Admitting: Interventional Cardiology

## 2022-12-11 ENCOUNTER — Other Ambulatory Visit (HOSPITAL_COMMUNITY): Payer: Self-pay

## 2022-12-11 ENCOUNTER — Ambulatory Visit: Payer: 59 | Attending: Interventional Cardiology | Admitting: Interventional Cardiology

## 2022-12-11 VITALS — BP 126/78 | HR 68 | Ht 69.0 in | Wt 156.0 lb

## 2022-12-11 DIAGNOSIS — R7303 Prediabetes: Secondary | ICD-10-CM

## 2022-12-11 DIAGNOSIS — E782 Mixed hyperlipidemia: Secondary | ICD-10-CM | POA: Diagnosis not present

## 2022-12-11 DIAGNOSIS — I700 Atherosclerosis of aorta: Secondary | ICD-10-CM

## 2022-12-11 DIAGNOSIS — I2510 Atherosclerotic heart disease of native coronary artery without angina pectoris: Secondary | ICD-10-CM | POA: Diagnosis not present

## 2022-12-11 NOTE — Patient Instructions (Signed)

## 2022-12-12 ENCOUNTER — Other Ambulatory Visit (HOSPITAL_COMMUNITY): Payer: Self-pay

## 2022-12-14 ENCOUNTER — Other Ambulatory Visit: Payer: Self-pay | Admitting: Interventional Cardiology

## 2022-12-15 ENCOUNTER — Other Ambulatory Visit: Payer: Self-pay | Admitting: *Deleted

## 2022-12-15 ENCOUNTER — Encounter: Payer: Self-pay | Admitting: Interventional Cardiology

## 2022-12-15 ENCOUNTER — Other Ambulatory Visit (HOSPITAL_COMMUNITY): Payer: Self-pay

## 2022-12-15 ENCOUNTER — Telehealth: Payer: Self-pay

## 2022-12-15 DIAGNOSIS — I2510 Atherosclerotic heart disease of native coronary artery without angina pectoris: Secondary | ICD-10-CM

## 2022-12-15 DIAGNOSIS — L409 Psoriasis, unspecified: Secondary | ICD-10-CM

## 2022-12-15 DIAGNOSIS — L4050 Arthropathic psoriasis, unspecified: Secondary | ICD-10-CM

## 2022-12-15 DIAGNOSIS — Z79899 Other long term (current) drug therapy: Secondary | ICD-10-CM

## 2022-12-15 MED ORDER — TALTZ 80 MG/ML ~~LOC~~ SOAJ
80.0000 mg | SUBCUTANEOUS | 0 refills | Status: DC
  Filled 2022-12-15 – 2022-12-18 (×2): qty 1, 28d supply, fill #0
  Filled 2023-01-05: qty 1, 28d supply, fill #1
  Filled 2023-02-03: qty 1, 28d supply, fill #2

## 2022-12-15 MED ORDER — ATORVASTATIN CALCIUM 40 MG PO TABS: ORAL_TABLET | Freq: Every day | ORAL | 3 refills | Status: DC

## 2022-12-15 MED ORDER — ATORVASTATIN CALCIUM 40 MG PO TABS
40.0000 mg | ORAL_TABLET | Freq: Every day | ORAL | 3 refills | Status: DC
  Filled 2022-12-15: qty 90, fill #0
  Filled 2022-12-18: qty 90, 90d supply, fill #0

## 2022-12-15 NOTE — Telephone Encounter (Signed)
Patient stopped by the office to have labs done. Butch Penny left early due to being sick and the patient was unable to have labs done. Patient states he can come back next Monday. Patient is requesting a refill on his Taltz.   Next Visit: 02/18/2023  Last Visit: 08/13/2022  Last Fill: 10/09/2022  [id number] arthritis    Current Dose per office note 08/13/2022: Taltz 80 mg subcu every 28 days    Labs: 08/15/2022 EOS 0.5  MCH 32.3  HDL 35  TB Gold: 05/21/2022 negative    Okay to refill Taltz?

## 2022-12-15 NOTE — Telephone Encounter (Signed)
Patient stated the clinic sent the refill of atorvastatin to the wrong pharmacy. Confirmed and sent prescription to the correct pharmacy. Patient voiced understanding.

## 2022-12-16 ENCOUNTER — Other Ambulatory Visit (HOSPITAL_COMMUNITY): Payer: Self-pay

## 2022-12-18 ENCOUNTER — Other Ambulatory Visit: Payer: Self-pay

## 2022-12-18 ENCOUNTER — Other Ambulatory Visit (HOSPITAL_COMMUNITY): Payer: Self-pay

## 2022-12-19 ENCOUNTER — Other Ambulatory Visit (HOSPITAL_COMMUNITY): Payer: Self-pay

## 2022-12-19 MED ORDER — ATORVASTATIN CALCIUM 40 MG PO TABS
40.0000 mg | ORAL_TABLET | Freq: Every day | ORAL | 3 refills | Status: DC
  Filled 2022-12-19 – 2023-01-15 (×2): qty 90, 90d supply, fill #0
  Filled 2023-04-04: qty 90, 90d supply, fill #1
  Filled 2023-07-10 – 2023-07-14 (×2): qty 90, 90d supply, fill #2
  Filled 2023-10-15: qty 90, 90d supply, fill #3

## 2022-12-22 ENCOUNTER — Other Ambulatory Visit: Payer: Self-pay | Admitting: *Deleted

## 2022-12-22 DIAGNOSIS — Z79899 Other long term (current) drug therapy: Secondary | ICD-10-CM

## 2022-12-22 NOTE — Addendum Note (Signed)
Addended by: Sharee Holster R on: 12/22/2022 04:08 PM   Modules accepted: Orders

## 2022-12-23 LAB — COMPLETE METABOLIC PANEL WITH GFR
AG Ratio: 1.6 (calc) (ref 1.0–2.5)
ALT: 15 U/L (ref 9–46)
AST: 18 U/L (ref 10–35)
Albumin: 4.3 g/dL (ref 3.6–5.1)
Alkaline phosphatase (APISO): 69 U/L (ref 35–144)
BUN: 16 mg/dL (ref 7–25)
CO2: 29 mmol/L (ref 20–32)
Calcium: 9.8 mg/dL (ref 8.6–10.3)
Chloride: 105 mmol/L (ref 98–110)
Creat: 0.87 mg/dL (ref 0.70–1.35)
Globulin: 2.7 g/dL (calc) (ref 1.9–3.7)
Glucose, Bld: 94 mg/dL (ref 65–99)
Potassium: 4.3 mmol/L (ref 3.5–5.3)
Sodium: 143 mmol/L (ref 135–146)
Total Bilirubin: 0.4 mg/dL (ref 0.2–1.2)
Total Protein: 7 g/dL (ref 6.1–8.1)
eGFR: 95 mL/min/{1.73_m2} (ref >=60)

## 2022-12-23 LAB — CBC WITH DIFFERENTIAL/PLATELET
Absolute Monocytes: 490 cells/uL (ref 200–950)
Basophils Absolute: 68 cells/uL (ref 0–200)
Basophils Relative: 1 %
Eosinophils Absolute: 272 cells/uL (ref 15–500)
Eosinophils Relative: 4 %
HCT: 39.5 % (ref 38.5–50.0)
Hemoglobin: 13.7 g/dL (ref 13.2–17.1)
Lymphs Abs: 2088 cells/uL (ref 850–3900)
MCH: 31.4 pg (ref 27.0–33.0)
MCHC: 34.7 g/dL (ref 32.0–36.0)
MCV: 90.4 fL (ref 80.0–100.0)
MPV: 10.8 fL (ref 7.5–12.5)
Monocytes Relative: 7.2 %
Neutro Abs: 3883 cells/uL (ref 1500–7800)
Neutrophils Relative %: 57.1 %
Platelets: 255 10*3/uL (ref 140–400)
RBC: 4.37 10*6/uL (ref 4.20–5.80)
RDW: 11.9 % (ref 11.0–15.0)
Total Lymphocyte: 30.7 %
WBC: 6.8 10*3/uL (ref 3.8–10.8)

## 2022-12-23 NOTE — Progress Notes (Signed)
CBC and CMP are normal.

## 2023-01-05 ENCOUNTER — Other Ambulatory Visit: Payer: Self-pay

## 2023-01-14 ENCOUNTER — Ambulatory Visit: Payer: 59 | Admitting: Neurology

## 2023-01-14 ENCOUNTER — Other Ambulatory Visit (HOSPITAL_COMMUNITY): Payer: Self-pay

## 2023-01-15 ENCOUNTER — Other Ambulatory Visit (HOSPITAL_COMMUNITY): Payer: Self-pay

## 2023-01-28 LAB — LAB REPORT - SCANNED
A1c: 5.7
EGFR: 89

## 2023-02-03 ENCOUNTER — Other Ambulatory Visit (HOSPITAL_COMMUNITY): Payer: Self-pay

## 2023-02-03 NOTE — Progress Notes (Signed)
Office Visit Note  Patient: Ricardo [name]             Date of Birth: 01/25/1956           MRN: [id number]             PCP: Creola Corn, MD Referring: Creola Corn, MD Visit Date: 02/17/2023 Occupation: @GUAROCC @  Subjective:  Medication management  History of Present Illness: Ricardo [name] is a 67 y.o. male with history of psoriatic arthritis and psoriasis.  He has been taking Taltz 2 mg subcu every 28 days.  He states he has noticed that he eventually the joint pain and discomfort has improved.  He is not experiencing any discomfort in his hands except for some morning stiffness.  The discomfort he was experiencing in his ankle joints has resolved.  He has not had any sternal pain.  He denies any history of chest pain or palpitations.    Activities of Daily Living:  Patient reports morning stiffness for a few hours.   Patient Denies nocturnal pain.  Difficulty dressing/grooming: Denies Difficulty climbing stairs: Denies Difficulty getting out of chair: Denies Difficulty using hands for taps, buttons, cutlery, and/or writing: Denies  Review of Systems  Constitutional:  Negative for fatigue.  HENT:  Negative for mouth sores and mouth dryness.   Eyes:  Negative for dryness.  Respiratory:  Negative for shortness of breath.   Cardiovascular:  Negative for chest pain and palpitations.  Gastrointestinal:  Negative for blood in stool, constipation and diarrhea.  Endocrine: Negative for increased urination.  Genitourinary:  Negative for involuntary urination.  Musculoskeletal:  Positive for joint pain, joint pain and morning stiffness. Negative for gait problem, joint swelling, myalgias, muscle weakness, muscle tenderness and myalgias.  Skin:  Negative for color change, rash, hair loss and sensitivity to sunlight.  Allergic/Immunologic: Negative for susceptible to infections.  Neurological:  Negative for dizziness and headaches.  Hematological:  Negative for swollen glands.   Psychiatric/Behavioral:  Negative for depressed mood and sleep disturbance. The patient is not nervous/anxious.     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   History of seizures as a child 07/21/2019   Snoring 07/21/2019   History of coronary artery disease 11/19/2018   Current use of anticoagulant therapy 11/19/2018   Psoriasis 11/19/2018   Psoriatic arthritis 11/19/2018   High risk medication use 11/19/2018   DDD (degenerative disc disease), cervical 11/19/2018   Encounter for medication review 05/13/2018    Past Medical History:  Diagnosis Date   Coronary heart disease    2 stent placed March 2019   DDD (degenerative disc disease), cervical 11/19/2018   Difficult intubation    pt stated small airway   H/O heart artery stent    History of coronary artery disease 11/19/2018   Status post stent placement March 2019   History of seizures    No seizures in over 20 years.    Psoriasis 11/19/2018   clobetasol   Psoriatic arthritis    On Cosentyx since 2017    Family History  Problem Relation Age of Onset   Heart disease Father    Past Surgical History:  Procedure Laterality Date   COLONOSCOPY  2007 & 2016   Normal   COLONOSCOPY WITH PROPOFOL N/A 05/07/2021   Procedure: COLONOSCOPY WITH PROPOFOL;  Surgeon: Charna Elizabeth, MD;  Location: [facility];  Service: Endoscopy;  Laterality:  N/A;   CYSTOSCOPY Right 2009   Removal of Ureteric Stone   CYSTOSCOPY  12/2019   ESOPHAGOGASTRODUODENOSCOPY (EGD) WITH PROPOFOL N/A 05/07/2021   Procedure: ESOPHAGOGASTRODUODENOSCOPY (EGD) WITH PROPOFOL;  Surgeon: Charna Elizabeth, MD;  Location: [facility];  Service: Endoscopy;  Laterality: N/A;   HEMORRHOID SURGERY  2004   External hemorrhoid removed    HERNIA REPAIR     INGUINAL HERNIA REPAIR  1976   PILONIDAL CYST / SINUS EXCISION  2001   THORACOTOMY  1997   and biopsy   UPPER GI ENDOSCOPY   2007 & 2016   Normal   Social History   Social History Narrative   Right handed   3 cups coffee per day       Immunization History  Administered Date(s) Administered   COVID-19, mRNA, vaccine(Comirnaty)12 years and older 09/03/2022   PFIZER(Purple Top)SARS-COV-2 Vaccination 10/23/2019, 11/14/2019, 07/13/2020, 07/21/2021     Objective: Vital Signs: BP 122/79 (BP Location: Left Arm, Patient Position: Sitting, Cuff Size: Normal)   Pulse 73   Ht 5\' 9"  (1.753 m)   Wt 158 lb 9.6 oz (71.9 kg)   BMI 23.42 kg/m    Physical Exam Vitals and nursing note reviewed.  Constitutional:      Appearance: He is well-developed.  HENT:     Head: Normocephalic and atraumatic.  Eyes:     Conjunctiva/sclera: Conjunctivae normal.     Pupils: Pupils are equal, round, and reactive to light.  Cardiovascular:     Rate and Rhythm: Normal rate and regular rhythm.     Heart sounds: Normal heart sounds.  Pulmonary:     Effort: Pulmonary effort is normal.     Breath sounds: Normal breath sounds.  Abdominal:     General: Bowel sounds are normal.     Palpations: Abdomen is soft.  Musculoskeletal:     Cervical back: Normal range of motion and neck supple.  Skin:    General: Skin is warm and dry.     Capillary Refill: Capillary refill takes less than 2 seconds.  Neurological:     Mental Status: He is alert and oriented to person, place, and time.  Psychiatric:        Behavior: Behavior normal.      Musculoskeletal Exam: Cervical, thoracic and lumbar spine were in good range of motion.  He had no SI joint tenderness.  Tight hamstrings were noted.  Shoulder joints, elbow joints, wrist joints were in good range of motion.  He had synovial thickening over MCP joints but no synovitis was noted.  He had bilateral PIP and DIP thickening.  No synovitis was noted.  Hip joints and knee joints were in good range of motion.  No warmth swelling or effusion was noted.  There was no planter fasciitis, Achilles  tendinitis.  There was no tenderness over MTPs or PIPs.  CDAI Exam: CDAI Score: -- Patient Global: --; Provider Global: -- Swollen: --; Tender: -- Joint Exam 02/17/2023   No joint exam has been documented for this visit   There is currently no information documented on the homunculus. Go to the Rheumatology activity and complete the homunculus joint exam.  Investigation: No additional findings.  Imaging: No results found.  Recent Labs: Lab Results  Component Value Date   WBC 6.8 12/22/2022   HGB 13.7 12/22/2022   PLT 255 12/22/2022   NA 143 12/22/2022   K 4.3 12/22/2022   CL 105 12/22/2022   CO2 29 12/22/2022   GLUCOSE 94 12/22/2022  BUN 16 12/22/2022   CREATININE 0.87 12/22/2022   BILITOT 0.4 12/22/2022   ALKPHOS 79 01/13/2020   AST 18 12/22/2022   ALT 15 12/22/2022   PROT 7.0 12/22/2022   ALBUMIN 4.4 01/13/2020   CALCIUM 9.8 12/22/2022   GFRAA 109 05/10/2021   QFTBGOLDPLUS NEGATIVE 05/21/2022   January 27, 2023 CMP creatinine 0.94, GFR 89, AST 20, ALT 21, LDL 54, TSH 2.62, PSA normal, CBC WBC 7.7, hemoglobin 13.7, platelets 231, hemoglobin A1c 5.7  Speciality Comments: Inadequate response to methotrexate, Enbrel, Humira Cosyntex November 2017-April 2023 Taltz-03/05/22  Procedures:  No procedures performed Allergies: Patient has no known allergies.   Assessment / Plan:     Visit Diagnoses: Psoriatic arthritis - diagnosed in 1999, treated by Dr. Rigoberto Noel in [place] -he had inadequate response to methotrexate, Enbrel, Humira and Cosentyx.  He had been on Taltz since Mar 05, 2022.  He states gradually Altamease Oiler started working and he has noticed improvement in joint stiffness and discomfort.  He denies any history of joint swelling.  No synovitis was noted on the examination today.  There is no history of uveitis, Planter fasciitis or Achilles tendinitis.  He has been working out at [employer] on a regular basis now.  We discussed obtaining x-rays at the follow-up visit to  monitor the disease process.  Psoriasis -he gets occasional rash from psoriasis.  Prescription for Cesol cream was given per his request.  Plan: clobetasol cream (TEMOVATE) 0.05 %  High risk medication use - Taltz 80 mg subcu every 28 days started on Mar 05, 2022.  Previous treatment methotrexate, Enbrel, Humira, Cosentyx.  Labs obtained from January 27 2023 were reviewed.  CBC and CMP were normal.  TB Gold was negative on May 22, 2023.  He was advised to get repeat labs in June and every 3 months to monitor for drug toxicity.  Will check TB Gold with the next labs.  Arthritis of carpometacarpal (CMC) joint of right thumb-he continues to have some discomfort in the [facility] joints.  Joint protection muscle strengthening was discussed.  Current use of anticoagulant therapy - On plavix  History of coronary artery disease - Status post stent placement March 2019.  Followed by cardiology.  He denies any history of chest pain.  Prediabetes-hemoglobin A1c is elevated at 5.7.  Dietary modifications were discussed.  Gastroesophageal reflux disease with esophagitis without hemorrhage  History of vitamin D deficiency  Obstructive sleep apnea syndrome  Orders: No orders of the defined types were placed in this encounter.  Meds ordered this encounter  Medications   clobetasol cream (TEMOVATE) 0.05 %    Sig: Apply 1 Application topically 2 (two) times daily.    Dispense:  45 g    Refill:  1     Follow-Up Instructions: Return in about 5 months (around 07/20/2023) for Psoriatic arthritis.   Pollyann Savoy, MD  Note - This record has been created using Animal nutritionist.  Chart creation errors have been sought, but may not always  have been located. Such creation errors do not reflect on  the standard of medical care.

## 2023-02-07 ENCOUNTER — Other Ambulatory Visit: Payer: Self-pay | Admitting: Interventional Cardiology

## 2023-02-09 ENCOUNTER — Other Ambulatory Visit (HOSPITAL_COMMUNITY): Payer: Self-pay

## 2023-02-09 MED ORDER — CLOPIDOGREL BISULFATE 75 MG PO TABS
75.0000 mg | ORAL_TABLET | Freq: Every day | ORAL | 3 refills | Status: DC
  Filled 2023-02-09: qty 90, 90d supply, fill #0
  Filled 2023-05-11: qty 90, 90d supply, fill #1
  Filled 2023-07-20: qty 90, 90d supply, fill #2
  Filled 2023-11-05: qty 90, 90d supply, fill #3

## 2023-02-09 MED ORDER — METOPROLOL SUCCINATE ER 25 MG PO TB24
12.5000 mg | ORAL_TABLET | Freq: Every day | ORAL | 3 refills | Status: DC
  Filled 2023-02-09: qty 45, 90d supply, fill #0
  Filled 2023-05-11: qty 45, 90d supply, fill #1
  Filled 2023-09-04: qty 45, 90d supply, fill #2
  Filled 2023-11-29: qty 45, 90d supply, fill #3

## 2023-02-09 MED ORDER — NITROGLYCERIN 0.4 MG SL SUBL
0.4000 mg | SUBLINGUAL_TABLET | SUBLINGUAL | 3 refills | Status: DC | PRN
  Filled 2023-02-09: qty 25, 1d supply, fill #0
  Filled 2024-01-14: qty 25, 1d supply, fill #1

## 2023-02-16 ENCOUNTER — Ambulatory Visit: Payer: 59 | Attending: Internal Medicine | Admitting: Pharmacist

## 2023-02-16 DIAGNOSIS — Z79899 Other long term (current) drug therapy: Secondary | ICD-10-CM

## 2023-02-16 NOTE — Progress Notes (Signed)
   S: Patient presents today for review of their specialty medication.   Patient is taking Taltz for psoriatic arthriits. Patient is managed by Dr. Corliss Skains for this. He had inadequate responses to methotrexate, Enbrel, Humira. He was on Cosentyx for some time but continued to experience intermittent pain and swelling.  Dosing: Psoriatic arthritis: SubQ: 160 mg once, followed by 80 mg every 4 weeks; may administer alone or in combination with conventional disease-modifying antirheumatic drugs (eg, methotrexate). Note: For psoriatic arthritis patients with coexisting moderate to severe plaque psoriasis, use the dosing regimen for plaque psoriasis.  Adherence: confirms   Efficacy: reports the medication works well.   Monitoring:  S/sx infection: none reported Injection site reactions:  none reported S/sx of IBD:  none reported S/sx of hypersensitivity reaction:  none reported  Current adverse effects:  none reported   O:     Lab Results  Component Value Date   WBC 6.8 12/22/2022   HGB 13.7 12/22/2022   HCT 39.5 12/22/2022   MCV 90.4 12/22/2022   PLT 255 12/22/2022      Chemistry      Component Value Date/Time   NA 143 12/22/2022 1155   NA 140 01/13/2020 1211   K 4.3 12/22/2022 1155   CL 105 12/22/2022 1155   CO2 29 12/22/2022 1155   BUN 16 12/22/2022 1155   BUN 15 01/13/2020 1211   CREATININE 0.87 12/22/2022 1155      Component Value Date/Time   CALCIUM 9.8 12/22/2022 1155   ALKPHOS 79 01/13/2020 1211   AST 18 12/22/2022 1155   ALT 15 12/22/2022 1155   BILITOT 0.4 12/22/2022 1155   BILITOT 0.2 01/13/2020 1211       A/P: 1. Medication review: patient is taking Taltz for psoriatic arthritis. Reviewed the medication with the patient, including the following: Altamease Oiler is a medication used to treat ankylosing spondylitis, plaque psoriasis, and psoriatic arthritis. Subcutaneous: Allow to reach room temperature prior to injection (30 minutes). Do not shake. Inject full  amount into the upper arms, thighs or any quadrant of the abdomen; administer each injection at a different anatomic location than a previous injection and avoid areas where the skin is tender, bruised, erythematous, indurated, or affected by psoriasis. Administration in the upper, outer arm may be performed by a caregiver or health care provider. Ixekizumab is intended for use under the guidance and supervision of a physician; may be self-injected by the patient following proper training in SubQ injection technique. Possible adverse effects include neutropenia, antibody development, increased risk of infection, injection site reaction, hypersensitivity reactions, and inflammatory bowel disease. Avoid live vaccinations. No recommendations for any changes at this time.  Butch Penny, PharmD, Patsy [place], CPP Clinical Pharmacist [facility] [phone]

## 2023-02-17 ENCOUNTER — Other Ambulatory Visit: Payer: Self-pay

## 2023-02-17 ENCOUNTER — Encounter: Payer: Self-pay | Admitting: Rheumatology

## 2023-02-17 ENCOUNTER — Ambulatory Visit: Payer: 59 | Admitting: Rheumatology

## 2023-02-17 ENCOUNTER — Ambulatory Visit: Payer: 59 | Attending: Rheumatology | Admitting: Rheumatology

## 2023-02-17 ENCOUNTER — Other Ambulatory Visit (HOSPITAL_COMMUNITY): Payer: Self-pay

## 2023-02-17 VITALS — BP 122/79 | HR 73 | Ht 69.0 in | Wt 158.6 lb

## 2023-02-17 DIAGNOSIS — Z8639 Personal history of other endocrine, nutritional and metabolic disease: Secondary | ICD-10-CM | POA: Diagnosis not present

## 2023-02-17 DIAGNOSIS — Z7901 Long term (current) use of anticoagulants: Secondary | ICD-10-CM | POA: Diagnosis not present

## 2023-02-17 DIAGNOSIS — L4050 Arthropathic psoriasis, unspecified: Secondary | ICD-10-CM | POA: Diagnosis not present

## 2023-02-17 DIAGNOSIS — G4733 Obstructive sleep apnea (adult) (pediatric): Secondary | ICD-10-CM | POA: Diagnosis not present

## 2023-02-17 DIAGNOSIS — M1811 Unilateral primary osteoarthritis of first carpometacarpal joint, right hand: Secondary | ICD-10-CM | POA: Diagnosis not present

## 2023-02-17 DIAGNOSIS — Z8679 Personal history of other diseases of the circulatory system: Secondary | ICD-10-CM

## 2023-02-17 DIAGNOSIS — Z79899 Other long term (current) drug therapy: Secondary | ICD-10-CM | POA: Diagnosis not present

## 2023-02-17 DIAGNOSIS — R7303 Prediabetes: Secondary | ICD-10-CM

## 2023-02-17 DIAGNOSIS — L409 Psoriasis, unspecified: Secondary | ICD-10-CM | POA: Diagnosis not present

## 2023-02-17 DIAGNOSIS — K2100 Gastro-esophageal reflux disease with esophagitis, without bleeding: Secondary | ICD-10-CM | POA: Diagnosis not present

## 2023-02-17 MED ORDER — CLOBETASOL PROPIONATE 0.05 % EX CREA
1.0000 | TOPICAL_CREAM | Freq: Two times a day (BID) | CUTANEOUS | 1 refills | Status: AC
Start: 2023-02-17 — End: ?
  Filled 2023-02-17: qty 45, 20d supply, fill #0
  Filled 2023-07-10 – 2024-02-01 (×2): qty 45, 20d supply, fill #1

## 2023-02-17 NOTE — Patient Instructions (Signed)
Standing Labs We placed an order today for your standing lab work.   Please have your standing labs drawn in June and every 3 months  Please have your labs drawn 2 weeks prior to your appointment so that the provider can discuss your lab results at your appointment, if possible.  Please note that you may see your imaging and lab results in MyChart before we have reviewed them. We will contact you once all results are reviewed. Please allow our office up to 72 hours to thoroughly review all of the results before contacting the office for clarification of your results.  WALK-IN LAB HOURS  Monday through Thursday from 8:00 am -12:30 pm and 1:00 pm-5:00 pm and Friday from 8:00 am-12:00 pm.  Patients with office visits requiring labs will be seen before walk-in labs.  You may encounter longer than normal wait times. Please allow additional time. Wait times may be shorter on  Monday and Thursday afternoons.  We do not book appointments for walk-in labs. We appreciate your patience and understanding with our staff.   Labs are drawn by [facility]. Please bring your co-pay at the time of your lab draw.  You may receive a bill from [facility] for your lab work.  Please note if you are on Hydroxychloroquine and and an order has been placed for a Hydroxychloroquine level,  you will need to have it drawn 4 hours or more after your last dose.  If you wish to have your labs drawn at another location, please call the office 24 hours in advance so we can fax the orders.  The office is located at [address], [address], [place], [place] [address]   If you have any questions regarding directions or hours of operation,  please call [phone].   As a reminder, please drink plenty of water prior to coming for your lab work. Thanks!  Vaccines You are taking a medication(s) that can suppress your immune system.  The following immunizations are recommended: Flu annually Covid-19  Td/Tdap (tetanus, diphtheria,  pertussis) every 10 years Pneumonia (Prevnar 15 then Pneumovax 23 at least 1 year apart.  Alternatively, can take Prevnar 20 without needing additional dose) Shingrix: 2 doses from 4 weeks to 6 months apart  Please check with your PCP to make sure you are up to date.   If you have signs or symptoms of an infection or start antibiotics: First, call your PCP for workup of your infection. Hold your medication through the infection, until you complete your antibiotics, and until symptoms resolve if you take the following: Injectable medication (Actemra, Benlysta, Cimzia, Cosentyx, Enbrel, Humira, Kevzara, Orencia, Remicade, Simponi, Stelara, Taltz, Tremfya) Methotrexate Leflunomide (Arava) Mycophenolate (Cellcept) Xeljanz, Olumiant, or Rinvoq  

## 2023-02-18 ENCOUNTER — Ambulatory Visit: Payer: 59 | Admitting: Rheumatology

## 2023-02-18 ENCOUNTER — Other Ambulatory Visit: Payer: Self-pay

## 2023-02-19 ENCOUNTER — Other Ambulatory Visit: Payer: Self-pay

## 2023-02-19 ENCOUNTER — Other Ambulatory Visit (HOSPITAL_COMMUNITY): Payer: Self-pay

## 2023-02-20 ENCOUNTER — Other Ambulatory Visit (HOSPITAL_COMMUNITY): Payer: Self-pay

## 2023-03-05 ENCOUNTER — Other Ambulatory Visit: Payer: Self-pay | Admitting: Rheumatology

## 2023-03-05 ENCOUNTER — Other Ambulatory Visit (HOSPITAL_COMMUNITY): Payer: Self-pay

## 2023-03-05 DIAGNOSIS — L409 Psoriasis, unspecified: Secondary | ICD-10-CM

## 2023-03-05 DIAGNOSIS — Z79899 Other long term (current) drug therapy: Secondary | ICD-10-CM

## 2023-03-05 DIAGNOSIS — L4050 Arthropathic psoriasis, unspecified: Secondary | ICD-10-CM

## 2023-03-05 MED ORDER — TALTZ 80 MG/ML ~~LOC~~ SOAJ
80.0000 mg | SUBCUTANEOUS | 0 refills | Status: DC
Start: 2023-03-05 — End: 2023-06-04
  Filled 2023-03-05: qty 1, 28d supply, fill #0
  Filled 2023-04-09: qty 1, 28d supply, fill #1
  Filled 2023-05-12: qty 1, 28d supply, fill #2

## 2023-03-05 NOTE — Telephone Encounter (Signed)
Last Fill: 12/15/2022  Labs: 01/27/2023 creatinine 0.94, GFR 89, AST 20, ALT 21, LDL 54, TSH 2.62, PSA normal, CBC WBC 7.7, hemoglobin 13.7, platelets 231   TB Gold: 05/21/2022 Neg    Next Visit: 08/18/2023  Last Visit: 02/17/2023  ZO:[id number] arthritis   Current Dose per office note 02/17/2023: Taltz 80 mg subcu every 28 days   Okay to refill Taltz?

## 2023-03-16 ENCOUNTER — Other Ambulatory Visit: Payer: Self-pay

## 2023-03-16 ENCOUNTER — Other Ambulatory Visit (HOSPITAL_COMMUNITY): Payer: Self-pay

## 2023-03-17 ENCOUNTER — Other Ambulatory Visit: Payer: Self-pay

## 2023-03-17 ENCOUNTER — Other Ambulatory Visit (HOSPITAL_COMMUNITY): Payer: Self-pay

## 2023-03-19 ENCOUNTER — Ambulatory Visit (INDEPENDENT_AMBULATORY_CARE_PROVIDER_SITE_OTHER): Payer: 59 | Admitting: Neurology

## 2023-03-19 ENCOUNTER — Encounter: Payer: Self-pay | Admitting: Neurology

## 2023-03-19 VITALS — BP 109/69 | HR 68 | Ht 69.0 in | Wt 156.0 lb

## 2023-03-19 DIAGNOSIS — I7772 Dissection of iliac artery: Secondary | ICD-10-CM

## 2023-03-19 DIAGNOSIS — G4733 Obstructive sleep apnea (adult) (pediatric): Secondary | ICD-10-CM | POA: Diagnosis not present

## 2023-03-19 NOTE — Progress Notes (Signed)
Provider:  Melvyn Novas, MD  Primary Care Physician:  Creola Corn, MD [address] [place] [address]     Referring Provider: Creola Corn, Md [address] 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  03-19-2023:  Chief concern according to patient :  no complaint.   Psoriatic arthritis, CAD, Yearly RV, compliance of CPAP; nasal pillow user. Machine was issued in 2020 and is due to be replaced next year, He was told that he needs a HST before .   Ricardo. Lavona [name] compliance has been about 80% as in the past, an average use of time of 6 hours 39 minutes is logged.  His settings are between 6 and 16 cmH2O pressure and 1 cm EPR. His residual AHI is 0.2/h which is low and shows good control he does not have major air leakage but is mild to moderate 95th percentile is 18 L a minute and his pressure requirements at the 95th percentile 8.3 cm water.  Medication list was reviewed he has recently changed his arthritis medication so Malarone has been discontinued and he is using Taltz 80 mg/mL injection every 28 days.  He still remains on metoprolol multivitamin he no longer uses Protonix at this time.  He endorsed the Epworth Sleepiness Scale at 3 out of 24 points and the fatigue severity score at only 9 out of 63 points both below.  The geriatric depression scale was endorsed at 2 out of 15 points.       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  Ricardo. Judeth [name] compliance has been excellent 100% by days and time with an average of 6 hours and 47 minutes.  I have the pleasure to see him today after a 2-year hiatus in which she has remained highly compliant patient is  AutoSet with a serial #[id number] has a minimum pressure setting of 6 and a maximum of 16 cmH2O with 1 cm expiratory pressure relief.  He is using a nasal pillow his 95th percentile is 20.4 L a minute his 95th percentile pressure is 9 cmH2O residual apnea-hypopnea index is 0.1.  Perfect data. He owns a chin strap- wants the same to be replaced but has to contact the DME that sold him that accessory.    Set up date for auto PAP was 08-2019- he is due for a new CPAP in 11/ 2025.       His first consultation was on 07-21-2019  upon referral by Ricardo [name],  10-02-2020: Interval History, I have the pleasure of meeting today with Ricardo. Rudene [name], he has become a highly compliant CPAP user 97% for days and hours with an average user time at night of 6 hours 29 minutes.  He is using an AutoSet with a minimum pressure setting of 6 at the maximum pressure of 16 cmH2O.  He has an expiratory relief setting of 1 cm.  His residual AHI is 0.1 apneas per hour this is an excellent resolution.  his wife is sleeping much better in the same room. His snoring is gone. He is starting to like it, too. He uses a nasal pillow. ResMed P 10 airfit. I like for him to try a bella swift with headgear option.    He was to great surprise diagnosed with a rather severe form of sleep apnea.  His sleep study was performed 08-06-2019.  Slept for 57.6% of the recorded time.  The oxygen nadir was 78% the total time spent below 89% SPO2 equal 20 minutes.  There were several arousals most of them spontaneous but 43 of them associated with respiratory events.  There were 106 obstructive apneas 1 mixed apnea and 19 shallow breathing spells-hypopneas.  This amounts to an AHI per hour of sleep of 32.1.  In rem sleep the apnea index was accelerated to 52.1/h and the patient briefly resumed supine sleep he was noted to snore loudly.   He started  Using an auto CPAP, would have liked to use a dental device , but his kind of apnea is not effectively  responding to dental devices.  Ricardo. Judeth [name] has used his machine 27 out of 30 days, with an average use at time of 5 hours and 4 minutes.   He is using an air sense 10 AutoSet with a serial #[id number] the minimum pressure is 6 the maximum pressure of 16 cmH2O with 1 cm expiratory pressure relief.  The 95 percentile pressure is 8.5 cmH2O well within his current settings.  His residual AHI is 0.3/h which is an excellent resolution of his not mild baseline apnea.  No central apneas have emerged.    He used first a FFM but had aerophagia and now is on nasal pillows.  He hasn't been sleepy in daytime an that hasn't changed, normally gets a good 5 hours of sleep in bloc, and then another 1-2 hours.   He likes to read, to cruise, he drinks little alcohol.     Review of Systems: Out of a complete 14 system review, the patient complains of only the following symptoms, and all other reviewed systems are negative.:     How likely are you to doze in the following situations: 0 = not likely, 1 = slight chance, 2 = moderate chance, 3 = high chance   Sitting and Reading? Watching Television? Sitting inactive in a public place (theater or meeting)? As a passenger in a car for an hour without a break? Lying down in the afternoon when circumstances permit? Sitting and talking to someone? Sitting quietly after lunch without alcohol? In a car, while stopped for a few minutes in traffic?   He endorsed the Epworth Sleepiness Scale at 3 out of 24 points and the fatigue severity score at only 9 out of 63 points both below.  The geriatric depression scale was endorsed at 2 out of 15 points.  Social History   Socioeconomic History   Marital status: Married    Spouse name: Not on file   Number of children: Not on file   Years of education: Not on file   Highest education level: Not on file  Occupational History   Not on file  Tobacco Use   Smoking status: Former    Types: Cigarettes    Quit date:  6    Years since quitting: 31.3    Passive exposure: Never   Smokeless tobacco: Never  Vaping Use   Vaping Use: Never used  Substance and Sexual Activity  Alcohol use: Yes    Comment: socially   Drug use: Never   Sexual activity: Not on file  Other Topics Concern   Not on file  Social History Narrative   Right handed   3 cups coffee per day       Social Determinants of Health   Financial Resource Strain: Not on file  Food Insecurity: Not on file  Transportation Needs: Not on file  Physical Activity: Not on file  Stress: Not on file  Social Connections: Not on file    Family History  Problem Relation Age of Onset   Heart disease Father     Past Medical History:  Diagnosis Date   Coronary heart disease    2 stent placed March 2019   DDD (degenerative disc disease), cervical 11/19/2018   Difficult intubation    pt stated small airway   H/O heart artery stent    History of coronary artery disease 11/19/2018   Status post stent placement March 2019   History of seizures    No seizures in over 20 years.    Psoriasis 11/19/2018   clobetasol   Psoriatic arthritis (HCC)    On Cosentyx since 2017    Past Surgical History:  Procedure Laterality Date   COLONOSCOPY  2007 & 2016   Normal   COLONOSCOPY WITH PROPOFOL N/A 05/07/2021   Procedure: COLONOSCOPY WITH PROPOFOL;  Surgeon: Charna Elizabeth, MD;  Location: [facility];  Service: Endoscopy;  Laterality: N/A;   CYSTOSCOPY Right 2009   Removal of Ureteric Stone   CYSTOSCOPY  12/2019   ESOPHAGOGASTRODUODENOSCOPY (EGD) WITH PROPOFOL N/A 05/07/2021   Procedure: ESOPHAGOGASTRODUODENOSCOPY (EGD) WITH PROPOFOL;  Surgeon: Charna Elizabeth, MD;  Location: [facility];  Service: Endoscopy;  Laterality: N/A;   HEMORRHOID SURGERY  2004   External hemorrhoid removed    HERNIA REPAIR     INGUINAL HERNIA REPAIR  1976   PILONIDAL CYST / SINUS EXCISION  2001   THORACOTOMY  1997   and biopsy   UPPER GI ENDOSCOPY  2007 & 2016    Normal     Current Outpatient Medications on File Prior to Visit  Medication Sig Dispense Refill   atorvastatin (LIPITOR) 40 MG tablet Take 1 tablet (40 mg total) by mouth daily. 90 tablet 3   clobetasol cream (TEMOVATE) 0.05 % Apply 1 Application topically 2 (two) times daily. (Patient taking differently: Apply 1 Application topically as needed.) 45 g 1   clopidogrel (PLAVIX) 75 MG tablet Take 1 tablet (75 mg total) by mouth daily. 90 tablet 3   Coenzyme Q10 200 MG capsule Take 200 mg by mouth daily.      Cyanocobalamin (VITAMIN B12) 1000 MCG TBCR Take 1,000 mg by mouth 2 (two) times a week.     diclofenac Sodium (VOLTAREN) 1 % GEL Apply 2-4 grams to affected joint 4 times daily as needed. 400 g 2   fluticasone (FLONASE) 50 MCG/ACT nasal spray Place into both nostrils as needed for allergies or rhinitis.     Ixekizumab (TALTZ) 80 MG/ML SOAJ Inject 80 mg into the skin every 28 (twenty-eight) days. 3 mL 0   loratadine (CLARITIN) 10 MG tablet Take 10 mg by mouth daily as needed for allergies.     metoprolol succinate (TOPROL-XL) 25 MG 24 hr tablet Take 1/2 tablet (12.5 mg) by mouth daily. 90 tablet 3   Multiple Vitamin (MULTIVITAMIN PO) Take 1 tablet by mouth daily.     nitroGLYCERIN (NITROSTAT) 0.4 MG SL tablet Place 1  tablet (0.4 mg total) under the tongue every 5 (five) minutes as needed for chest pain. 25 tablet 3   No current facility-administered medications on file prior to visit.    No Known Allergies   DIAGNOSTIC DATA (LABS, IMAGING, TESTING) - I reviewed patient records, labs, notes, testing and imaging myself where available.  Lab Results  Component Value Date   WBC 6.8 12/22/2022   HGB 13.7 12/22/2022   HCT 39.5 12/22/2022   MCV 90.4 12/22/2022   PLT 255 12/22/2022      Component Value Date/Time   NA 143 12/22/2022 1155   NA 140 01/13/2020 1211   K 4.3 12/22/2022 1155   CL 105 12/22/2022 1155   CO2 29 12/22/2022 1155   GLUCOSE 94 12/22/2022 1155   BUN 16 12/22/2022  1155   BUN 15 01/13/2020 1211   CREATININE 0.87 12/22/2022 1155   CALCIUM 9.8 12/22/2022 1155   PROT 7.0 12/22/2022 1155   PROT 7.2 01/13/2020 1211   ALBUMIN 4.4 01/13/2020 1211   AST 18 12/22/2022 1155   ALT 15 12/22/2022 1155   ALKPHOS 79 01/13/2020 1211   BILITOT 0.4 12/22/2022 1155   BILITOT 0.2 01/13/2020 1211   GFRNONAA 94 05/10/2021 1355   GFRAA 109 05/10/2021 1355   Lab Results  Component Value Date   CHOL 102 10/06/2019   HDL 37 (L) 10/06/2019   LDLCALC 45 10/06/2019   TRIG 107 10/06/2019   CHOLHDL 2.8 10/06/2019   Lab Results  Component Value Date   HGBA1C 5.7 (H) 11/22/2021   No results found for: "VITAMINB12" No results found for: "TSH"  PHYSICAL EXAM:  Today's Vitals   03/19/23 1557  BP: 109/69  Pulse: 68  Weight: 156 lb (70.8 kg)  Height: 5\' 9"  (1.753 m)   Body mass index is 23.04 kg/m.   Wt Readings from Last 3 Encounters:  03/19/23 156 lb (70.8 kg)  02/17/23 158 lb 9.6 oz (71.9 kg)  12/11/22 156 lb (70.8 kg)     Ht Readings from Last 3 Encounters:  03/19/23 5\' 9"  (1.753 m)  02/17/23 5\' 9"  (1.753 m)  12/11/22 5\' 9"  (1.753 m)      General:The patient is awake, alert and appears not in acute distress. The patient is well groomed. Head: Normocephalic, atraumatic. Neck is supple. Mallampati  3,  neck circumference:14. 75  inches . Nasal airflow s slightly congested.  Retrognathia is  seen.  Dental status:  Cardiovascular:  Regular rate and cardiac rhythm by pulse,  without distended neck veins. Respiratory: Lungs are clear to auscultation.  Skin:  Without evidence of ankle edema, or rash. Trunk: The patient's posture is erect.   Neurologic exam : The patient is awake and alert, oriented to place and time.   Memory subjective described as intact.  Attention span & concentration ability appears normal.  Speech is fluent,  without  dysarthria, dysphonia or aphasia.  Mood and affect are appropriate.   Cranial nerves: no loss of smell or  taste reported  Pupils are equal and briskly reactive to light.    Facial motor strength is symmetric and tongue and uvula move midline.  Neck  14. 75  ROM : rotation, tilt and flexion extension were normal for age and shoulder shrug was symmetrical.       RV in 12 month-uses a nasal pillow / mask N 30 I , we fitted him today for this type.     ASSESSMENT AND PLAN 67 y.o. year old male  here with:    1) OSA , compliant on CPAP.  Next year will need a HST to get a new baseline and replace the CPAP machine.  I plan to follow up either personally or through our NP within 12 months.   I would like to thank Ricardo. Corliss Skains  and Creola Corn, Md [address] [place],  [place] [address] for allowing me to meet with and to take care of this pleasant patient.    After spending a total time of  19  minutes face to face and additional time for physical and neurologic examination, review of laboratory studies,  personal review of imaging studies, reports and results of other testing and review of referral information / records as far as provided in visit,   Electronically signed by: Melvyn Novas, MD 03/19/2023 4:15 PM  [facility] Board certified by The [employer] of Sleep Medicine and Diplomate of the [employer] of [employer]. Board certified In Neurology through the ABPN, Fellow of the [employer] of [employer]. Medical Director of [facility].

## 2023-04-08 ENCOUNTER — Other Ambulatory Visit (HOSPITAL_COMMUNITY): Payer: Self-pay

## 2023-04-09 ENCOUNTER — Other Ambulatory Visit (HOSPITAL_COMMUNITY): Payer: Self-pay

## 2023-04-13 ENCOUNTER — Other Ambulatory Visit (HOSPITAL_COMMUNITY): Payer: Self-pay

## 2023-04-14 ENCOUNTER — Other Ambulatory Visit (HOSPITAL_COMMUNITY): Payer: Self-pay

## 2023-04-14 ENCOUNTER — Other Ambulatory Visit: Payer: Self-pay

## 2023-04-19 DIAGNOSIS — G4733 Obstructive sleep apnea (adult) (pediatric): Secondary | ICD-10-CM | POA: Diagnosis not present

## 2023-05-12 ENCOUNTER — Other Ambulatory Visit (HOSPITAL_COMMUNITY): Payer: Self-pay

## 2023-05-13 ENCOUNTER — Other Ambulatory Visit (HOSPITAL_COMMUNITY): Payer: Self-pay

## 2023-05-14 ENCOUNTER — Other Ambulatory Visit: Payer: Self-pay

## 2023-05-14 ENCOUNTER — Other Ambulatory Visit (HOSPITAL_COMMUNITY): Payer: Self-pay

## 2023-05-21 ENCOUNTER — Encounter: Payer: Self-pay | Admitting: Rheumatology

## 2023-05-25 ENCOUNTER — Other Ambulatory Visit: Payer: Self-pay | Admitting: *Deleted

## 2023-05-25 DIAGNOSIS — Z79899 Other long term (current) drug therapy: Secondary | ICD-10-CM

## 2023-05-26 ENCOUNTER — Other Ambulatory Visit: Payer: Self-pay | Admitting: *Deleted

## 2023-05-26 DIAGNOSIS — Z79899 Other long term (current) drug therapy: Secondary | ICD-10-CM

## 2023-05-26 LAB — CBC WITH DIFFERENTIAL/PLATELET
MCH: 30.8 pg (ref 27.0–33.0)
MPV: 10.9 fL (ref 7.5–12.5)
Monocytes Relative: 5.2 %
RDW: 12.1 % (ref 11.0–15.0)
Total Lymphocyte: 20.1 %

## 2023-05-27 LAB — CBC WITH DIFFERENTIAL/PLATELET
Basophils Absolute: 91 cells/uL (ref 0–200)
Eosinophils Relative: 1.5 %
MCHC: 33.6 g/dL (ref 32.0–36.0)
MCV: 91.8 fL (ref 80.0–100.0)
Neutro Abs: 6570 cells/uL (ref 1500–7800)
Neutrophils Relative %: 72.2 %
Platelets: 235 10*3/uL (ref 140–400)

## 2023-05-27 LAB — COMPLETE METABOLIC PANEL WITH GFR
AG Ratio: 1.8 (calc) (ref 1.0–2.5)
ALT: 16 U/L (ref 9–46)
AST: 19 U/L (ref 10–35)
Alkaline phosphatase (APISO): 65 U/L (ref 35–144)
CO2: 28 mmol/L (ref 20–32)
Calcium: 9.5 mg/dL (ref 8.6–10.3)
Globulin: 2.5 g/dL (calc) (ref 1.9–3.7)
Glucose, Bld: 110 mg/dL — ABNORMAL HIGH (ref 65–99)
Potassium: 4.1 mmol/L (ref 3.5–5.3)
Total Bilirubin: 0.4 mg/dL (ref 0.2–1.2)
Total Protein: 6.9 g/dL (ref 6.1–8.1)
eGFR: 96 mL/min/{1.73_m2} (ref >=60)

## 2023-05-27 NOTE — Progress Notes (Signed)
CBC and CMP are normal.  Glucose is mildly elevated, probably not a fasting sample.

## 2023-05-28 LAB — COMPLETE METABOLIC PANEL WITH GFR
Albumin: 4.4 g/dL (ref 3.6–5.1)
BUN: 15 mg/dL (ref 7–25)
Chloride: 104 mmol/L (ref 98–110)
Creat: 0.84 mg/dL (ref 0.70–1.35)
Sodium: 140 mmol/L (ref 135–146)

## 2023-05-28 LAB — CBC WITH DIFFERENTIAL/PLATELET
Absolute Monocytes: 473 cells/uL (ref 200–950)
Basophils Relative: 1 %
Eosinophils Absolute: 137 cells/uL (ref 15–500)
HCT: 39.3 % (ref 38.5–50.0)
Hemoglobin: 13.2 g/dL (ref 13.2–17.1)
Lymphs Abs: 1829 cells/uL (ref 850–3900)
RBC: 4.28 10*6/uL (ref 4.20–5.80)
WBC: 9.1 10*3/uL (ref 3.8–10.8)

## 2023-05-28 LAB — QUANTIFERON-TB GOLD PLUS
Mitogen-NIL: 9.09 IU/mL
QuantiFERON-TB Gold Plus: NEGATIVE
TB1-NIL: 0.03 IU/mL
TB2-NIL: 0 IU/mL

## 2023-05-28 NOTE — Progress Notes (Signed)
TB Gold is negative.

## 2023-06-04 ENCOUNTER — Other Ambulatory Visit (HOSPITAL_COMMUNITY): Payer: Self-pay

## 2023-06-04 ENCOUNTER — Other Ambulatory Visit: Payer: Self-pay

## 2023-06-04 ENCOUNTER — Other Ambulatory Visit: Payer: Self-pay | Admitting: Rheumatology

## 2023-06-04 DIAGNOSIS — Z79899 Other long term (current) drug therapy: Secondary | ICD-10-CM

## 2023-06-04 DIAGNOSIS — L409 Psoriasis, unspecified: Secondary | ICD-10-CM

## 2023-06-04 DIAGNOSIS — L4050 Arthropathic psoriasis, unspecified: Secondary | ICD-10-CM

## 2023-06-04 MED ORDER — TALTZ 80 MG/ML ~~LOC~~ SOAJ
80.0000 mg | SUBCUTANEOUS | 0 refills | Status: DC
  Filled 2023-06-04: qty 3, 84d supply, fill #0
  Filled 2023-06-08: qty 1, 28d supply, fill #0
  Filled 2023-07-10 – 2023-07-13 (×2): qty 1, 28d supply, fill #1
  Filled 2023-08-12: qty 1, 28d supply, fill #2

## 2023-06-04 NOTE — Telephone Encounter (Signed)
Last Fill: 03/05/2023  Labs: 05/26/2023 CBC and CMP are normal.  Glucose is mildly elevated   TB Gold: 05/26/2023 Neg   Next Visit: 08/18/2023  Last Visit: 02/17/2023  DX: Psoriatic arthritis   Current Dose per office note 02/17/2023: Taltz 80 mg subcu every 28 days   Okay to refill Taltz?

## 2023-06-08 ENCOUNTER — Other Ambulatory Visit (HOSPITAL_COMMUNITY): Payer: Self-pay

## 2023-06-08 ENCOUNTER — Other Ambulatory Visit: Payer: Self-pay

## 2023-06-15 ENCOUNTER — Other Ambulatory Visit (HOSPITAL_COMMUNITY): Payer: Self-pay

## 2023-06-16 ENCOUNTER — Other Ambulatory Visit: Payer: Self-pay

## 2023-06-16 ENCOUNTER — Other Ambulatory Visit (HOSPITAL_COMMUNITY): Payer: Self-pay

## 2023-06-17 DIAGNOSIS — G4733 Obstructive sleep apnea (adult) (pediatric): Secondary | ICD-10-CM | POA: Diagnosis not present

## 2023-07-13 ENCOUNTER — Other Ambulatory Visit (HOSPITAL_COMMUNITY): Payer: Self-pay

## 2023-07-13 ENCOUNTER — Other Ambulatory Visit: Payer: Self-pay

## 2023-07-13 ENCOUNTER — Encounter (HOSPITAL_COMMUNITY): Payer: Self-pay

## 2023-07-14 ENCOUNTER — Other Ambulatory Visit: Payer: Self-pay

## 2023-07-14 ENCOUNTER — Encounter: Payer: Self-pay | Admitting: Pharmacist

## 2023-07-14 ENCOUNTER — Other Ambulatory Visit (HOSPITAL_COMMUNITY): Payer: Self-pay

## 2023-07-15 ENCOUNTER — Other Ambulatory Visit: Payer: Self-pay

## 2023-07-16 ENCOUNTER — Other Ambulatory Visit: Payer: Self-pay

## 2023-07-16 ENCOUNTER — Other Ambulatory Visit (HOSPITAL_COMMUNITY): Payer: Self-pay

## 2023-07-21 ENCOUNTER — Other Ambulatory Visit (HOSPITAL_COMMUNITY): Payer: Self-pay

## 2023-07-21 MED ORDER — COVID-19 MRNA VAC-TRIS(PFIZER) 30 MCG/0.3ML IM SUSY
0.3000 mL | PREFILLED_SYRINGE | Freq: Once | INTRAMUSCULAR | 0 refills | Status: AC
  Filled 2023-07-21: qty 0.3, 1d supply, fill #0

## 2023-07-22 ENCOUNTER — Other Ambulatory Visit (HOSPITAL_COMMUNITY): Payer: Self-pay

## 2023-08-03 ENCOUNTER — Telehealth: Payer: Self-pay | Admitting: Pharmacist

## 2023-08-03 NOTE — Telephone Encounter (Signed)
Submitted a Prior Authorization request to [facility] for TALTZ via CoverMyMeds. Will update once we receive a response.  Key: [id number]

## 2023-08-05 NOTE — Progress Notes (Signed)
Office Visit Note  Patient: Ricardo [name]             Date of Birth: 1956-09-17           MRN: [id number]             PCP: Creola Corn, MD Referring: Creola Corn, MD Visit Date: 08/18/2023 Occupation: @GUAROCC @  Subjective:  Medication management  History of Present Illness: Ricardo [name] is a 67 y.o. male with psoriatic arthritis, psoriasis and osteoarthritis.  He states he has been taking Taltz 80 mg subcu every 28 days without any interruption.  He has been exercising on a regular basis.  He has intermittent discomfort in his wrist and his hands but he has not noticed any joint swelling.  He has some discomfort in his feet which she describes under the metatarsals.  He has not noticed any joint swelling.  He had no psoriasis flares.  Has been tolerating Toltz without any side effects.    Activities of Daily Living:  Patient reports morning stiffness for a 0 minutes.   Patient Denies nocturnal pain.  Difficulty dressing/grooming: Denies Difficulty climbing stairs: Denies Difficulty getting out of chair: Denies Difficulty using hands for taps, buttons, cutlery, and/or writing: Denies  Review of Systems  Constitutional:  Negative for fatigue.  HENT:  Negative for mouth sores and mouth dryness.   Eyes:  Negative for dryness.  Respiratory:  Negative for shortness of breath.   Cardiovascular:  Negative for chest pain and palpitations.  Gastrointestinal:  Negative for blood in stool, constipation and diarrhea.  Endocrine: Negative for increased urination.  Genitourinary:  Negative for involuntary urination.  Musculoskeletal:  Negative for joint pain, gait problem, joint pain, joint swelling, myalgias, muscle weakness, morning stiffness, muscle tenderness and myalgias.  Skin:  Negative for color change, rash, hair loss and sensitivity to sunlight.  Allergic/Immunologic: Negative for susceptible to infections.  Neurological:  Negative for dizziness and headaches.  Hematological:   Negative for swollen glands.  Psychiatric/Behavioral:  Negative for depressed mood and sleep disturbance. The patient is not nervous/anxious.     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   History of seizures as a child 07/21/2019   Snoring 07/21/2019   History of coronary artery disease 11/19/2018   Current use of anticoagulant therapy 11/19/2018   Psoriasis 11/19/2018   Psoriatic arthritis (HCC) 11/19/2018   High risk medication use 11/19/2018   DDD (degenerative disc disease), cervical 11/19/2018   Encounter for medication review 05/13/2018    Past Medical History:  Diagnosis Date   Coronary heart disease    2 stent placed March 2019   DDD (degenerative disc disease), cervical 11/19/2018   Difficult intubation    pt stated small airway   H/O heart artery stent    History of coronary artery disease 11/19/2018   Status post stent placement March 2019   History of seizures    No seizures in over 20 years.    Psoriasis 11/19/2018   clobetasol   Psoriatic arthritis (HCC)    On Cosentyx since 2017    Family History  Problem Relation Age of Onset   Heart disease Father    Past Surgical History:  Procedure Laterality Date   COLONOSCOPY  2007 & 2016   Normal   COLONOSCOPY WITH PROPOFOL N/A 05/07/2021   Procedure: COLONOSCOPY WITH PROPOFOL;  Surgeon: Charna Elizabeth, MD;  Location: [facility];  Service: Endoscopy;  Laterality: N/A;   CYSTOSCOPY Right 2009   Removal of Ureteric Stone   CYSTOSCOPY  12/2019   ESOPHAGOGASTRODUODENOSCOPY (EGD) WITH PROPOFOL N/A 05/07/2021   Procedure: ESOPHAGOGASTRODUODENOSCOPY (EGD) WITH PROPOFOL;  Surgeon: Charna Elizabeth, MD;  Location: [facility];  Service: Endoscopy;  Laterality: N/A;   HEMORRHOID SURGERY  2004   External hemorrhoid removed    HERNIA REPAIR     INGUINAL HERNIA REPAIR  1976   PILONIDAL CYST / SINUS EXCISION  2001    THORACOTOMY  1997   and biopsy   UPPER GI ENDOSCOPY  2007 & 2016   Normal   Social History   Social History Narrative   Right handed   3 cups coffee per day       Immunization History  Administered Date(s) Administered   PFIZER(Purple Top)SARS-COV-2 Vaccination 10/23/2019, 11/14/2019, 07/13/2020, 07/21/2021   Pfizer(Comirnaty)Fall Seasonal Vaccine 12 years and older 09/03/2022     Objective: Vital Signs: BP 122/74 (BP Location: Left Arm, Patient Position: Sitting, Cuff Size: Normal)   Pulse 65   Resp 15   Ht 5\' 9"  (1.753 m)   Wt 159 lb 12.8 oz (72.5 kg)   BMI 23.60 kg/m    Physical Exam Vitals and nursing note reviewed.  Constitutional:      Appearance: He is well-developed.  HENT:     Head: Normocephalic and atraumatic.  Eyes:     Conjunctiva/sclera: Conjunctivae normal.     Pupils: Pupils are equal, round, and reactive to light.  Cardiovascular:     Rate and Rhythm: Normal rate and regular rhythm.     Heart sounds: Normal heart sounds.  Pulmonary:     Effort: Pulmonary effort is normal.     Breath sounds: Normal breath sounds.  Abdominal:     General: Bowel sounds are normal.     Palpations: Abdomen is soft.  Musculoskeletal:     Cervical back: Normal range of motion and neck supple.  Skin:    General: Skin is warm and dry.     Capillary Refill: Capillary refill takes less than 2 seconds.  Neurological:     Mental Status: He is alert and oriented to person, place, and time.  Psychiatric:        Behavior: Behavior normal.      Musculoskeletal Exam: Cervical, thoracic and lumbar spine were in good range of motion.  Mild thoracic kyphosis was noted.  Shoulder joints, elbow joints, wrist joints, MCPs PIPs and DIPs in good range of motion with no synovitis.  He had limited extension of her right third and fifth PIP joints with no synovitis.  Hip joints and knee joints in good range of motion without any warmth swelling or effusion.  There was no tenderness over  ankles or MTPs.  There was no plantar fasciitis or Achilles tendinitis.  CDAI Exam: CDAI Score: -- Patient Global: --; Provider Global: -- Swollen: --; Tender: -- Joint Exam 08/18/2023   No joint exam has been documented for this visit   There is currently no information documented on the homunculus. Go to the Rheumatology activity and complete the homunculus joint exam.  Investigation: No additional findings.  Imaging: No results found.  Recent Labs: Lab Results  Component Value Date   WBC 9.1 05/26/2023   HGB 13.2 05/26/2023   PLT 235 05/26/2023   NA 140 05/26/2023   K 4.1 05/26/2023   CL 104 05/26/2023   CO2 28 05/26/2023   GLUCOSE 110 (H)  05/26/2023   BUN 15 05/26/2023   CREATININE 0.84 05/26/2023   BILITOT 0.4 05/26/2023   ALKPHOS 79 01/13/2020   AST 19 05/26/2023   ALT 16 05/26/2023   PROT 6.9 05/26/2023   ALBUMIN 4.4 01/13/2020   CALCIUM 9.5 05/26/2023   GFRAA 109 05/10/2021   QFTBGOLDPLUS NEGATIVE 05/26/2023    Speciality Comments: Inadequate response to methotrexate, Enbrel, Humira Cosyntex November 2017-April 2023 Taltz-03/05/22  Procedures:  No procedures performed Allergies: Patient has no known allergies.   Assessment / Plan:     Visit Diagnoses: Psoriatic arthritis (HCC) - diagnosed in 1999, treated by Dr. Rigoberto Noel in [place] -he had inadequate response to methotrexate, Enbrel, Humira and Cosentyx.  He has been doing much better since he has been taking Runner, broadcasting/film/video.  He denies having a flare of psoriasis or psoriatic arthritis since the last visit.  He denies any history of uveitis, left plantar fasciitis or Achilles tendinitis.  No synovitis was noted on the examination.    Psoriasis-no psoriasis lesions were noted.  He uses clobetasol on as needed basis.  High risk medication use - Taltz 80 mg subcu every 28 days started on Mar 05, 2022.  Previous treatment methotrexate, Enbrel, Humira, Cosentyx.  CBC and CMP were normal on May 26, 2023.  TB Gold was  negative.  Will be getting repeat labs through his PCP.  He was advised to get labs every 3 months.  Information for immunization was placed in the AVS.  He was advised to hold Taltz if he develops an infection resume after the infection resolves.  Arthritis of carpometacarpal (CMC) joint of right thumb-he does ultrasound at his work.  He notices some discomfort in his wrist joints.  No synovitis was noted.  There was mild tenderness over right CMC joint.  A handout on muscle strengthening exercises was given.  Muscle cramps-he gives history of occasional muscle cramps in his feet.  Stretching exercises were discussed.  Use of magnesium malate was discussed.  Pes planus of both feet-he notices some discomfort in his feet.  No synovitis was noted.  Use of arch support and shoes with arch support was discussed.  Current use of anticoagulant therapy-he is on Plavix.  History of coronary artery disease - Status post stent placement March 2019.  Followed by cardiology.  Prediabetes  Gastroesophageal reflux disease with esophagitis without hemorrhage  History of vitamin D deficiency  Obstructive sleep apnea syndrome  Orders: No orders of the defined types were placed in this encounter.  No orders of the defined types were placed in this encounter.    Follow-Up Instructions: Return in about 5 months (around 01/16/2024) for Psoriatic arthritis.   Pollyann Savoy, MD  Note - This record has been created using Animal nutritionist.  Chart creation errors have been sought, but may not always  have been located. Such creation errors do not reflect on  the standard of medical care.

## 2023-08-06 NOTE — Telephone Encounter (Signed)
Received notification from [facility] regarding a prior authorization for TALTZ. Authorization has been APPROVED from 08/14/2023 to 08/12/2024. Approval letter sent to scan center.  Patient must continue to fill through [facility]: [id number]   Authorization # [id number] Phone # [phone]  Chesley Mires, PharmD, MPH, BCPS, CPP Clinical Pharmacist (Rheumatology and Pulmonology)

## 2023-08-12 ENCOUNTER — Other Ambulatory Visit: Payer: Self-pay

## 2023-08-12 ENCOUNTER — Other Ambulatory Visit (HOSPITAL_COMMUNITY): Payer: Self-pay

## 2023-08-12 NOTE — Progress Notes (Signed)
Specialty Pharmacy Refill Coordination Note  Ricardo [name] is a 67 y.o. male contacted today regarding refills of specialty medication(s) Ixekizumab   Patient requested Delivery   Delivery date: 08/19/23   Verified address: [address]   [place] [address]   Medication will be filled on 08/18/23.

## 2023-08-18 ENCOUNTER — Encounter: Payer: Self-pay | Admitting: Rheumatology

## 2023-08-18 ENCOUNTER — Ambulatory Visit: Payer: 59 | Attending: Rheumatology | Admitting: Rheumatology

## 2023-08-18 ENCOUNTER — Other Ambulatory Visit (HOSPITAL_COMMUNITY): Payer: Self-pay

## 2023-08-18 ENCOUNTER — Other Ambulatory Visit: Payer: Self-pay

## 2023-08-18 VITALS — BP 122/74 | HR 65 | Resp 15 | Ht 69.0 in | Wt 159.8 lb

## 2023-08-18 DIAGNOSIS — R7303 Prediabetes: Secondary | ICD-10-CM | POA: Diagnosis not present

## 2023-08-18 DIAGNOSIS — Z7901 Long term (current) use of anticoagulants: Secondary | ICD-10-CM | POA: Diagnosis not present

## 2023-08-18 DIAGNOSIS — M1811 Unilateral primary osteoarthritis of first carpometacarpal joint, right hand: Secondary | ICD-10-CM

## 2023-08-18 DIAGNOSIS — Z79899 Other long term (current) drug therapy: Secondary | ICD-10-CM | POA: Diagnosis not present

## 2023-08-18 DIAGNOSIS — M2142 Flat foot [pes planus] (acquired), left foot: Secondary | ICD-10-CM

## 2023-08-18 DIAGNOSIS — M2141 Flat foot [pes planus] (acquired), right foot: Secondary | ICD-10-CM

## 2023-08-18 DIAGNOSIS — L409 Psoriasis, unspecified: Secondary | ICD-10-CM

## 2023-08-18 DIAGNOSIS — L4050 Arthropathic psoriasis, unspecified: Secondary | ICD-10-CM | POA: Diagnosis not present

## 2023-08-18 DIAGNOSIS — Z8679 Personal history of other diseases of the circulatory system: Secondary | ICD-10-CM

## 2023-08-18 DIAGNOSIS — G4733 Obstructive sleep apnea (adult) (pediatric): Secondary | ICD-10-CM

## 2023-08-18 DIAGNOSIS — K2100 Gastro-esophageal reflux disease with esophagitis, without bleeding: Secondary | ICD-10-CM

## 2023-08-18 DIAGNOSIS — Z8639 Personal history of other endocrine, nutritional and metabolic disease: Secondary | ICD-10-CM | POA: Diagnosis not present

## 2023-08-18 DIAGNOSIS — R252 Cramp and spasm: Secondary | ICD-10-CM

## 2023-08-18 NOTE — Patient Instructions (Addendum)
Standing Labs We placed an order today for your standing lab work.   Please have your standing labs drawn in November and every 3 months  Please have your labs drawn 2 weeks prior to your appointment so that the provider can discuss your lab results at your appointment, if possible.  Please note that you may see your imaging and lab results in MyChart before we have reviewed them. We will contact you once all results are reviewed. Please allow our office up to 72 hours to thoroughly review all of the results before contacting the office for clarification of your results.  WALK-IN LAB HOURS  Monday through Thursday from 8:00 am -12:30 pm and 1:00 pm-5:00 pm and Friday from 8:00 am-12:00 pm.  Patients with office visits requiring labs will be seen before walk-in labs.  You may encounter longer than normal wait times. Please allow additional time. Wait times may be shorter on  Monday and Thursday afternoons.  We do not book appointments for walk-in labs. We appreciate your patience and understanding with our staff.   Labs are drawn by [facility]. Please bring your co-pay at the time of your lab draw.  You may receive a bill from [facility] for your lab work.  Please note if you are on Hydroxychloroquine and and an order has been placed for a Hydroxychloroquine level,  you will need to have it drawn 4 hours or more after your last dose.  If you wish to have your labs drawn at another location, please call the office 24 hours in advance so we can fax the orders.  The office is located at [address], [place], [place] [address]   If you have any questions regarding directions or hours of operation,  please call [phone].   As a reminder, please drink plenty of water prior to coming for your lab work. Thanks!   Vaccines You are taking a medication(s) that can suppress your immune system.  The following immunizations are recommended: Flu annually Covid-19  Td/Tdap (tetanus,  diphtheria, pertussis) every 10 years Pneumonia (Prevnar 15 then Pneumovax 23 at least 1 year apart.  Alternatively, can take Prevnar 20 without needing additional dose) Shingrix: 2 doses from 4 weeks to 6 months apart  Please check with your PCP to make sure you are up to date.   If you have signs or symptoms of an infection or start antibiotics: First, call your PCP for workup of your infection. Hold your medication through the infection, until you complete your antibiotics, and until symptoms resolve if you take the following: Injectable medication (Actemra, Benlysta, Cimzia, Cosentyx, Enbrel, Humira, Kevzara, Orencia, Remicade, Simponi, Stelara, Taltz, Tremfya) Methotrexate Leflunomide (Arava) Mycophenolate (Cellcept) Osborne [place], or Rinvoq  Hand Exercises Hand exercises can be helpful for almost anyone. They can strengthen your hands and improve flexibility and movement. The exercises can also increase blood flow to the hands. These results can make your work and daily tasks easier for you. Hand exercises can be especially helpful for people who have joint pain from arthritis or nerve damage from using their hands over and over. These exercises can also help people who injure a hand. Exercises Most of these hand exercises are gentle stretching and motion exercises. It is usually safe to do them often throughout the day. Warming up your hands before exercise may help reduce stiffness. You can do this with gentle massage or by placing your hands in warm water for 10-15 minutes. It is normal to feel some  stretching, pulling, tightness, or mild discomfort when you begin new exercises. In time, this will improve. Remember to always be careful and stop right away if you feel sudden, very bad pain or your pain gets worse. You want to get better and be safe. Ask your health care provider which exercises are safe for you. Do exercises exactly as told by your provider and adjust them as told.  Do not begin these exercises until told by your provider. Knuckle bend or "claw" fist  Stand or sit with your arm, hand, and all five fingers pointed straight up. Make sure to keep your wrist straight. Gently bend your fingers down toward your palm until the tips of your fingers are touching your palm. Keep your big knuckle straight and only bend the small knuckles in your fingers. Hold this position for 10 seconds. Straighten your fingers back to your starting position. Repeat this exercise 5-10 times with each hand. Full finger fist  Stand or sit with your arm, hand, and all five fingers pointed straight up. Make sure to keep your wrist straight. Gently bend your fingers into your palm until the tips of your fingers are touching the middle of your palm. Hold this position for 10 seconds. Extend your fingers back to your starting position, stretching every joint fully. Repeat this exercise 5-10 times with each hand. Straight fist  Stand or sit with your arm, hand, and all five fingers pointed straight up. Make sure to keep your wrist straight. Gently bend your fingers at the big knuckle, where your fingers meet your hand, and at the middle knuckle. Keep the knuckle at the tips of your fingers straight and try to touch the bottom of your palm. Hold this position for 10 seconds. Extend your fingers back to your starting position, stretching every joint fully. Repeat this exercise 5-10 times with each hand. Tabletop  Stand or sit with your arm, hand, and all five fingers pointed straight up. Make sure to keep your wrist straight. Gently bend your fingers at the big knuckle, where your fingers meet your hand, as far down as you can. Keep the small knuckles in your fingers straight. Think of forming a tabletop with your fingers. Hold this position for 10 seconds. Extend your fingers back to your starting position, stretching every joint fully. Repeat this exercise 5-10 times with each  hand. Finger spread  Place your hand flat on a table with your palm facing down. Make sure your wrist stays straight. Spread your fingers and thumb apart from each other as far as you can until you feel a gentle stretch. Hold this position for 10 seconds. Bring your fingers and thumb tight together again. Hold this position for 10 seconds. Repeat this exercise 5-10 times with each hand. Making circles  Stand or sit with your arm, hand, and all five fingers pointed straight up. Make sure to keep your wrist straight. Make a circle by touching the tip of your thumb to the tip of your index finger. Hold for 10 seconds. Then open your hand wide. Repeat this motion with your thumb and each of your fingers. Repeat this exercise 5-10 times with each hand. Thumb motion  Sit with your forearm resting on a table and your wrist straight. Your thumb should be facing up toward the ceiling. Keep your fingers relaxed as you move your thumb. Lift your thumb up as high as you can toward the ceiling. Hold for 10 seconds. Bend your thumb across your palm as  far as you can, reaching the tip of your thumb for the small finger (pinkie) side of your palm. Hold for 10 seconds. Repeat this exercise 5-10 times with each hand. Grip strengthening  Hold a stress ball or other soft ball in the middle of your hand. Slowly increase the pressure, squeezing the ball as much as you can without causing pain. Think of bringing the tips of your fingers into the middle of your palm. All of your finger joints should bend when doing this exercise. Hold your squeeze for 10 seconds, then relax. Repeat this exercise 5-10 times with each hand. Contact a health care provider if: Your hand pain or discomfort gets much worse when you do an exercise. Your hand pain or discomfort does not improve within 2 hours after you exercise. If you have either of these problems, stop doing these exercises right away. Do not do them again unless  your provider says that you can. Get help right away if: You develop sudden, severe hand pain or swelling. If this happens, stop doing these exercises right away. Do not do them again unless your provider says that you can. This information is not intended to replace advice given to you by your health care provider. Make sure you discuss any questions you have with your health care provider. Document Revised: 11/04/2022 Document Reviewed: 11/04/2022 Elsevier Patient Education  2024 [employer].

## 2023-08-19 ENCOUNTER — Other Ambulatory Visit (HOSPITAL_COMMUNITY): Payer: Self-pay

## 2023-08-28 DIAGNOSIS — E559 Vitamin D deficiency, unspecified: Secondary | ICD-10-CM | POA: Diagnosis not present

## 2023-08-28 DIAGNOSIS — Z1212 Encounter for screening for malignant neoplasm of rectum: Secondary | ICD-10-CM | POA: Diagnosis not present

## 2023-08-28 DIAGNOSIS — E785 Hyperlipidemia, unspecified: Secondary | ICD-10-CM | POA: Diagnosis not present

## 2023-08-28 DIAGNOSIS — Z1389 Encounter for screening for other disorder: Secondary | ICD-10-CM | POA: Diagnosis not present

## 2023-08-28 DIAGNOSIS — Z125 Encounter for screening for malignant neoplasm of prostate: Secondary | ICD-10-CM | POA: Diagnosis not present

## 2023-09-04 ENCOUNTER — Encounter: Payer: Self-pay | Admitting: Rheumatology

## 2023-09-04 DIAGNOSIS — D8989 Other specified disorders involving the immune mechanism, not elsewhere classified: Secondary | ICD-10-CM | POA: Diagnosis not present

## 2023-09-04 DIAGNOSIS — M180 Bilateral primary osteoarthritis of first carpometacarpal joints: Secondary | ICD-10-CM | POA: Diagnosis not present

## 2023-09-04 DIAGNOSIS — Z8611 Personal history of tuberculosis: Secondary | ICD-10-CM | POA: Diagnosis not present

## 2023-09-04 DIAGNOSIS — Z1212 Encounter for screening for malignant neoplasm of rectum: Secondary | ICD-10-CM | POA: Diagnosis not present

## 2023-09-04 DIAGNOSIS — I2510 Atherosclerotic heart disease of native coronary artery without angina pectoris: Secondary | ICD-10-CM | POA: Diagnosis not present

## 2023-09-04 DIAGNOSIS — Z1389 Encounter for screening for other disorder: Secondary | ICD-10-CM | POA: Diagnosis not present

## 2023-09-04 DIAGNOSIS — L4050 Arthropathic psoriasis, unspecified: Secondary | ICD-10-CM | POA: Diagnosis not present

## 2023-09-04 DIAGNOSIS — Z125 Encounter for screening for malignant neoplasm of prostate: Secondary | ICD-10-CM | POA: Diagnosis not present

## 2023-09-04 DIAGNOSIS — Z0000 Encounter for general adult medical examination without abnormal findings: Secondary | ICD-10-CM | POA: Diagnosis not present

## 2023-09-04 DIAGNOSIS — E559 Vitamin D deficiency, unspecified: Secondary | ICD-10-CM | POA: Diagnosis not present

## 2023-09-04 DIAGNOSIS — R82998 Other abnormal findings in urine: Secondary | ICD-10-CM | POA: Diagnosis not present

## 2023-09-04 DIAGNOSIS — E785 Hyperlipidemia, unspecified: Secondary | ICD-10-CM | POA: Diagnosis not present

## 2023-09-04 DIAGNOSIS — I7789 Other specified disorders of arteries and arterioles: Secondary | ICD-10-CM | POA: Diagnosis not present

## 2023-09-04 DIAGNOSIS — M4306 Spondylolysis, lumbar region: Secondary | ICD-10-CM | POA: Diagnosis not present

## 2023-09-04 DIAGNOSIS — N400 Enlarged prostate without lower urinary tract symptoms: Secondary | ICD-10-CM | POA: Diagnosis not present

## 2023-09-04 NOTE — Telephone Encounter (Signed)
Patient is currently on Taltz 80 mg subcu every 28 days. Please see attached labs.

## 2023-09-08 ENCOUNTER — Other Ambulatory Visit: Payer: Self-pay | Admitting: Rheumatology

## 2023-09-08 ENCOUNTER — Other Ambulatory Visit: Payer: Self-pay

## 2023-09-08 DIAGNOSIS — Z79899 Other long term (current) drug therapy: Secondary | ICD-10-CM

## 2023-09-08 DIAGNOSIS — L4050 Arthropathic psoriasis, unspecified: Secondary | ICD-10-CM

## 2023-09-08 DIAGNOSIS — L409 Psoriasis, unspecified: Secondary | ICD-10-CM

## 2023-09-08 MED ORDER — TALTZ 80 MG/ML ~~LOC~~ SOAJ
80.0000 mg | SUBCUTANEOUS | 0 refills | Status: DC
  Filled 2023-09-08: qty 1, 28d supply, fill #0
  Filled 2023-10-11: qty 1, 28d supply, fill #1
  Filled 2023-11-11: qty 1, 28d supply, fill #2

## 2023-09-08 NOTE — Progress Notes (Signed)
Specialty Pharmacy Refill Coordination Note  Ricardo [name] is a 67 y.o. male contacted today regarding refills of specialty medication(s) Ixekizumab   Patient requested Delivery   Delivery date: 09/17/23   Verified address: [address]   [place] [address]   Medication will be filled on 09/16/23.

## 2023-09-08 NOTE — Telephone Encounter (Signed)
Last Fill: 06/04/2023  Labs: 08/27/2023 CBC/CMP WNL  TB Gold: 05/26/2023 Neg    Next Visit: 02/16/2024  Last Visit: 08/18/2023  DX: Psoriatic arthritis    Current Dose per office note 08/18/2023: Taltz 80 mg subcu every 28 days   Okay to refill Taltz?

## 2023-09-16 ENCOUNTER — Other Ambulatory Visit: Payer: Self-pay

## 2023-10-09 DIAGNOSIS — G4733 Obstructive sleep apnea (adult) (pediatric): Secondary | ICD-10-CM | POA: Diagnosis not present

## 2023-10-12 ENCOUNTER — Other Ambulatory Visit: Payer: Self-pay

## 2023-10-12 ENCOUNTER — Other Ambulatory Visit (HOSPITAL_COMMUNITY): Payer: Self-pay

## 2023-10-12 NOTE — Progress Notes (Signed)
Specialty Pharmacy Refill Coordination Note  Ricardo [name] is a 67 y.o. male contacted today regarding refills of specialty medication(s) Ixekizumab   Patient requested Delivery   Delivery date: 10/14/23   Verified address: [address]   [place] [address]   Medication will be filled on 10/13/23.

## 2023-10-21 ENCOUNTER — Ambulatory Visit (INDEPENDENT_AMBULATORY_CARE_PROVIDER_SITE_OTHER): Payer: 59

## 2023-10-21 ENCOUNTER — Encounter: Payer: Self-pay | Admitting: Podiatry

## 2023-10-21 ENCOUNTER — Ambulatory Visit (INDEPENDENT_AMBULATORY_CARE_PROVIDER_SITE_OTHER): Payer: 59 | Admitting: Podiatry

## 2023-10-21 DIAGNOSIS — B078 Other viral warts: Secondary | ICD-10-CM | POA: Diagnosis not present

## 2023-10-21 DIAGNOSIS — M7751 Other enthesopathy of right foot: Secondary | ICD-10-CM

## 2023-10-21 DIAGNOSIS — D1801 Hemangioma of skin and subcutaneous tissue: Secondary | ICD-10-CM | POA: Diagnosis not present

## 2023-10-21 DIAGNOSIS — L400 Psoriasis vulgaris: Secondary | ICD-10-CM | POA: Diagnosis not present

## 2023-10-21 DIAGNOSIS — M778 Other enthesopathies, not elsewhere classified: Secondary | ICD-10-CM | POA: Diagnosis not present

## 2023-10-21 DIAGNOSIS — Z85828 Personal history of other malignant neoplasm of skin: Secondary | ICD-10-CM | POA: Diagnosis not present

## 2023-10-21 NOTE — Progress Notes (Signed)
Chief Complaint  Patient presents with   Foot Pain    RM#6 Bilateral foot pain more on right foot cramping in arches.    HPI: 67 y.o. male presenting today as a new patient for evaluation of pain and tenderness along the plantar first ray of the right foot.  Idiopathic gradual onset over the last few months.  He was concern for possible stress reaction fracture.  In certain shoes the patient has no symptoms.  He is currently wearing Merrills which provide good stability and support.  Past Medical History:  Diagnosis Date   Coronary heart disease    2 stent placed March 2019   DDD (degenerative disc disease), cervical 11/19/2018   Difficult intubation    pt stated small airway   H/O heart artery stent    History of coronary artery disease 11/19/2018   Status post stent placement March 2019   History of seizures    No seizures in over 20 years.    Psoriasis 11/19/2018   clobetasol   Psoriatic arthritis (HCC)    On Cosentyx since 2017    Past Surgical History:  Procedure Laterality Date   COLONOSCOPY  2007 & 2016   Normal   COLONOSCOPY WITH PROPOFOL N/A 05/07/2021   Procedure: COLONOSCOPY WITH PROPOFOL;  Surgeon: Charna Elizabeth, MD;  Location: [facility];  Service: Endoscopy;  Laterality: N/A;   CYSTOSCOPY Right 2009   Removal of Ureteric Stone   CYSTOSCOPY  12/2019   ESOPHAGOGASTRODUODENOSCOPY (EGD) WITH PROPOFOL N/A 05/07/2021   Procedure: ESOPHAGOGASTRODUODENOSCOPY (EGD) WITH PROPOFOL;  Surgeon: Charna Elizabeth, MD;  Location: [facility];  Service: Endoscopy;  Laterality: N/A;   HEMORRHOID SURGERY  2004   External hemorrhoid removed    HERNIA REPAIR     INGUINAL HERNIA REPAIR  1976   PILONIDAL CYST / SINUS EXCISION  2001   THORACOTOMY  1997   and biopsy   UPPER GI ENDOSCOPY  2007 & 2016   Normal    No Known Allergies   Physical Exam: General: The patient is alert and oriented x3 in no acute distress.  Dermatology: Skin is warm, dry and supple bilateral lower  extremities.   Vascular: Palpable pedal pulses bilaterally. Capillary refill within normal limits.  No appreciable edema.  No erythema.  Neurological: Grossly intact via light touch  Musculoskeletal Exam: No pedal deformities noted.  There is some tenderness with palpation just proximal to the sesamoidal apparatus along the first ray of the right foot.  No pain to the sesamoids or the first metatarsal.  Radiographic Exam RT foot 10/21/2023:  Normal osseous mineralization. Joint spaces preserved.  No fractures or osseous irregularities noted.  Moderate flatfoot deformity noted.  Impression: Flatfoot deformity.  Negative for fracture  Assessment/Plan of Care: 1.  Pes planovalgus bilateral 2.  Flexor hallucis tendinitis right  -Patient evaluated.  X-rays reviewed -Continue to wear good supportive shoes that provide stability and arch support. -Recommend [employer] running store.  Patient enjoys walking on the treadmill. -Also recommend OOFOS sandals or slides around the house.  Refrain from going barefoot -Return to clinic as needed       Felecia Shelling, DPM [facility]  Dr. Felecia Shelling, DPM    [address]                                        [place],  [place] [address]                Office [phone]  Fax [phone]

## 2023-10-23 ENCOUNTER — Other Ambulatory Visit (HOSPITAL_COMMUNITY): Payer: Self-pay

## 2023-11-05 ENCOUNTER — Other Ambulatory Visit (HOSPITAL_COMMUNITY): Payer: Self-pay

## 2023-11-05 ENCOUNTER — Other Ambulatory Visit: Payer: Self-pay | Admitting: Interventional Cardiology

## 2023-11-05 ENCOUNTER — Other Ambulatory Visit: Payer: Self-pay

## 2023-11-06 ENCOUNTER — Other Ambulatory Visit (HOSPITAL_COMMUNITY): Payer: Self-pay

## 2023-11-06 DIAGNOSIS — N401 Enlarged prostate with lower urinary tract symptoms: Secondary | ICD-10-CM | POA: Diagnosis not present

## 2023-11-06 DIAGNOSIS — R3912 Poor urinary stream: Secondary | ICD-10-CM | POA: Diagnosis not present

## 2023-11-06 MED ORDER — CLOPIDOGREL BISULFATE 75 MG PO TABS
75.0000 mg | ORAL_TABLET | Freq: Every day | ORAL | 0 refills | Status: DC
  Filled 2023-11-06 – 2024-02-01 (×2): qty 90, 90d supply, fill #0

## 2023-11-11 ENCOUNTER — Other Ambulatory Visit: Payer: Self-pay

## 2023-11-11 NOTE — Progress Notes (Signed)
 Specialty Pharmacy Ongoing Clinical Assessment Note  Ricardo [name] is a 68 y.o. male who is being followed by the specialty pharmacy service for RxSp Psoriatic Arthritis   Patient's specialty medication(s) reviewed today: Ixekizumab  (Taltz )   Missed doses in the last 4 weeks: 0   Patient/Caregiver did not have any additional questions or concerns.   Therapeutic benefit summary: Patient is achieving benefit   Adverse events/side effects summary: No adverse events/side effects   Patient's therapy is appropriate to: Continue    Goals Addressed             This Visit's Progress    Minimize recurrence of flares       Patient is on track. Patient will maintain adherence         Follow up:  6 months  Mitzie GORMAN [facility] Specialty Pharmacist

## 2023-11-11 NOTE — Progress Notes (Signed)
 Specialty Pharmacy Refill Coordination Note  Ricardo [name] is a 68 y.o. male contacted today regarding refills of specialty medication(s) Ixekizumab  (Taltz )   Patient requested Delivery   Delivery date: 11/19/23   Verified address: [address]   Medication will be filled on 11/18/23.

## 2023-11-18 ENCOUNTER — Other Ambulatory Visit: Payer: Self-pay

## 2023-12-10 ENCOUNTER — Encounter (HOSPITAL_COMMUNITY): Payer: Self-pay

## 2023-12-10 ENCOUNTER — Other Ambulatory Visit (HOSPITAL_COMMUNITY): Payer: Self-pay

## 2023-12-10 ENCOUNTER — Other Ambulatory Visit: Payer: Self-pay

## 2023-12-11 ENCOUNTER — Other Ambulatory Visit: Payer: Self-pay | Admitting: Rheumatology

## 2023-12-11 ENCOUNTER — Other Ambulatory Visit: Payer: Self-pay

## 2023-12-11 ENCOUNTER — Encounter: Payer: Self-pay | Admitting: *Deleted

## 2023-12-11 DIAGNOSIS — Z79899 Other long term (current) drug therapy: Secondary | ICD-10-CM

## 2023-12-11 DIAGNOSIS — L409 Psoriasis, unspecified: Secondary | ICD-10-CM

## 2023-12-11 DIAGNOSIS — L4050 Arthropathic psoriasis, unspecified: Secondary | ICD-10-CM

## 2023-12-11 MED ORDER — TALTZ 80 MG/ML ~~LOC~~ SOAJ
80.0000 mg | SUBCUTANEOUS | 0 refills | Status: DC
  Filled 2023-12-11: qty 1, 28d supply, fill #0
  Filled 2024-01-12 (×2): qty 1, 28d supply, fill #1
  Filled 2024-02-10: qty 1, 28d supply, fill #2

## 2023-12-11 NOTE — Progress Notes (Signed)
 Specialty Pharmacy Refill Coordination Note  Ricardo [name] is a 68 y.o. male contacted today regarding refills of specialty medication(s) Ixekizumab  (Taltz )   Patient requested (Patient-Rptd) Delivery   Delivery date: (Patient-Rptd) 12/17/23   Verified address: (Patient-Rptd) [address] [place], [place] [address]   Medication will be filled on 02.12.25.   Refill request pending.

## 2023-12-11 NOTE — Telephone Encounter (Signed)
 Last Fill: 09/08/2023  Labs: 08/29/2023  TB Gold: 05/26/2023 Neg    Next Visit: 02/16/2024  Last Visit: 08/18/2023  IK:Ednmpjupr arthritis    Current Dose per office note 08/18/2023:  Taltz  80 mg subcu every 28 days   Message sent to patient to remind him he is due to update labs.   Okay to refill Taltz ?

## 2023-12-15 ENCOUNTER — Other Ambulatory Visit: Payer: Self-pay | Admitting: *Deleted

## 2023-12-15 DIAGNOSIS — Z79899 Other long term (current) drug therapy: Secondary | ICD-10-CM | POA: Diagnosis not present

## 2023-12-16 ENCOUNTER — Other Ambulatory Visit: Payer: Self-pay

## 2023-12-16 ENCOUNTER — Other Ambulatory Visit (HOSPITAL_COMMUNITY): Payer: Self-pay

## 2023-12-16 LAB — COMPLETE METABOLIC PANEL WITH GFR
AG Ratio: 1.8 (calc) (ref 1.0–2.5)
ALT: 22 U/L (ref 9–46)
AST: 24 U/L (ref 10–35)
Albumin: 4.4 g/dL (ref 3.6–5.1)
Alkaline phosphatase (APISO): 69 U/L (ref 35–144)
BUN: 11 mg/dL (ref 7–25)
CO2: 28 mmol/L (ref 20–32)
Calcium: 9.5 mg/dL (ref 8.6–10.3)
Chloride: 105 mmol/L (ref 98–110)
Creat: 0.92 mg/dL (ref 0.70–1.35)
Globulin: 2.4 g/dL (ref 1.9–3.7)
Glucose, Bld: 96 mg/dL (ref 65–99)
Potassium: 4.4 mmol/L (ref 3.5–5.3)
Sodium: 140 mmol/L (ref 135–146)
Total Bilirubin: 0.5 mg/dL (ref 0.2–1.2)
Total Protein: 6.8 g/dL (ref 6.1–8.1)
eGFR: 91 mL/min/{1.73_m2} (ref >=60)

## 2023-12-16 LAB — CBC WITH DIFFERENTIAL/PLATELET
Absolute Lymphocytes: 1742 {cells}/uL (ref 850–3900)
Absolute Monocytes: 535 {cells}/uL (ref 200–950)
Basophils Absolute: 59 {cells}/uL (ref 0–200)
Basophils Relative: 0.9 %
Eosinophils Absolute: 370 {cells}/uL (ref 15–500)
Eosinophils Relative: 5.6 %
HCT: 39 % (ref 38.5–50.0)
Hemoglobin: 13.3 g/dL (ref 13.2–17.1)
MCH: 30.9 pg (ref 27.0–33.0)
MCHC: 34.1 g/dL (ref 32.0–36.0)
MCV: 90.5 fL (ref 80.0–100.0)
MPV: 11.1 fL (ref 7.5–12.5)
Monocytes Relative: 8.1 %
Neutro Abs: 3894 {cells}/uL (ref 1500–7800)
Neutrophils Relative %: 59 %
Platelets: 235 10*3/uL (ref 140–400)
RBC: 4.31 10*6/uL (ref 4.20–5.80)
RDW: 12.1 % (ref 11.0–15.0)
Total Lymphocyte: 26.4 %
WBC: 6.6 10*3/uL (ref 3.8–10.8)

## 2023-12-16 NOTE — Progress Notes (Signed)
CBC and CMP are normal.

## 2023-12-17 ENCOUNTER — Other Ambulatory Visit (HOSPITAL_COMMUNITY): Payer: Self-pay

## 2023-12-17 MED ORDER — OSELTAMIVIR PHOSPHATE 75 MG PO CAPS
75.0000 mg | ORAL_CAPSULE | Freq: Two times a day (BID) | ORAL | 0 refills | Status: AC
  Filled 2023-12-17: qty 10, 5d supply, fill #0

## 2024-01-12 ENCOUNTER — Other Ambulatory Visit: Payer: Self-pay

## 2024-01-12 ENCOUNTER — Other Ambulatory Visit (HOSPITAL_COMMUNITY): Payer: Self-pay

## 2024-01-12 NOTE — Progress Notes (Signed)
 Specialty Pharmacy Refill Coordination Note  Ricardo [name] is a 68 y.o. male contacted today regarding refills of specialty medication(s) Taltz.  Patient requested (Patient-Rptd) Delivery   Delivery date: (Patient-Rptd) 01/15/24   Verified address: (Patient-Rptd) [address] [place], [place] [address]   Medication will be filled on 01/14/24.

## 2024-01-13 ENCOUNTER — Other Ambulatory Visit (HOSPITAL_COMMUNITY): Payer: Self-pay

## 2024-01-14 ENCOUNTER — Other Ambulatory Visit: Payer: Self-pay | Admitting: Interventional Cardiology

## 2024-01-14 DIAGNOSIS — I2510 Atherosclerotic heart disease of native coronary artery without angina pectoris: Secondary | ICD-10-CM

## 2024-01-15 ENCOUNTER — Other Ambulatory Visit (HOSPITAL_COMMUNITY): Payer: Self-pay

## 2024-01-15 ENCOUNTER — Other Ambulatory Visit: Payer: Self-pay

## 2024-01-15 MED ORDER — ATORVASTATIN CALCIUM 40 MG PO TABS
40.0000 mg | ORAL_TABLET | Freq: Every day | ORAL | 0 refills | Status: AC
Start: 2024-01-15 — End: ?
  Filled 2024-01-15: qty 90, 90d supply, fill #0

## 2024-01-27 DIAGNOSIS — G4733 Obstructive sleep apnea (adult) (pediatric): Secondary | ICD-10-CM | POA: Diagnosis not present

## 2024-02-01 ENCOUNTER — Other Ambulatory Visit (HOSPITAL_COMMUNITY): Payer: Self-pay

## 2024-02-06 LAB — LAB REPORT - SCANNED
A1c: 5.7
EGFR: 87

## 2024-02-07 DIAGNOSIS — Z23 Encounter for immunization: Secondary | ICD-10-CM | POA: Diagnosis not present

## 2024-02-09 ENCOUNTER — Ambulatory Visit: Payer: 59 | Attending: Cardiovascular Disease | Admitting: Cardiovascular Disease

## 2024-02-09 ENCOUNTER — Encounter: Payer: Self-pay | Admitting: Cardiovascular Disease

## 2024-02-09 VITALS — BP 130/72 | HR 80 | Resp 16 | Ht 69.0 in | Wt 156.0 lb

## 2024-02-09 DIAGNOSIS — R011 Cardiac murmur, unspecified: Secondary | ICD-10-CM

## 2024-02-09 DIAGNOSIS — R7303 Prediabetes: Secondary | ICD-10-CM

## 2024-02-09 DIAGNOSIS — E785 Hyperlipidemia, unspecified: Secondary | ICD-10-CM

## 2024-02-09 DIAGNOSIS — I700 Atherosclerosis of aorta: Secondary | ICD-10-CM | POA: Diagnosis not present

## 2024-02-09 DIAGNOSIS — I2510 Atherosclerotic heart disease of native coronary artery without angina pectoris: Secondary | ICD-10-CM | POA: Diagnosis not present

## 2024-02-09 DIAGNOSIS — I7772 Dissection of iliac artery: Secondary | ICD-10-CM

## 2024-02-09 DIAGNOSIS — E782 Mixed hyperlipidemia: Secondary | ICD-10-CM

## 2024-02-09 NOTE — Patient Instructions (Signed)
 Medication Instructions:  No changes *If you need a refill on your cardiac medications before your next appointment, please call your pharmacy*  Lab Work: None ordered If you have labs (blood work) drawn today and your tests are completely normal, you will receive your results only by: MyChart Message (if you have MyChart) OR A paper copy in the mail If you have any lab test that is abnormal or we need to change your treatment, we will call you to review the results.  Testing/Procedures: Your physician has requested that you have an echocardiogram. Echocardiography is a painless test that uses sound waves to create images of your heart. It provides your doctor with information about the size and shape of your heart and how well your heart's chambers and valves are working.   You may receive an ultrasound enhancing agent through an IV if needed to better visualize your heart during the echo. This procedure takes approximately one hour.  There are no restrictions for this procedure.  This will take place at [address] ([facility] [address], [place] [address]  Your physician has requested that you have an abdominal aorta duplex. During this test, an ultrasound is used to evaluate the aorta. Allow 30 minutes for this exam. Do not eat after midnight the day before and avoid carbonated beverages. This will take place at [address] ([facility], [place] [address]   Follow-Up: At [facility], you and your health needs are our priority.  As part of our continuing mission to provide you with exceptional heart care, our providers are all part of one team.  This team includes your primary Cardiologist (physician) and Advanced Practice Providers or APPs (Physician Assistants and Nurse Practitioners) who all work together to provide you with the care you need, when you need it.  Your next appointment:   12 month(s)  Provider:   You may see Dr. Kirke Corin  or one of the following Advanced Practice Providers on your designated Care Team:   Nicolasa Ducking, NP Ames Dura, PA-C Eula Listen, PA-C Cadence [place]town, PA-C Charlsie Quest, NP Carlos Levering, NP    We recommend signing up for the patient portal called "MyChart".  Sign up information is provided on this After Visit Summary.  MyChart is used to connect with patients for Virtual Visits (Telemedicine).  Patients are able to view lab/test results, encounter notes, upcoming appointments, etc.  Non-urgent messages can be sent to your provider as well.   To learn more about what you can do with MyChart, go to [email or web address].

## 2024-02-09 NOTE — Progress Notes (Signed)
 Cardiology Office Note   Date:  02/09/2024   ID:  Ricardo [name], Henner Jun 13, 1956, MRN [id number]  PCP:  Margarete Sharps, MD  Cardiologist:   Antionette Kirks, MD   Chief Complaint  Patient presents with   Coronary artery disease involving native coronary artery of   Follow-up    1 year      History of Present Illness: Ricardo [name] is a 68 y.o. male who presents for a follow-up visit regarding coronary artery disease.  He was previously followed by Dr. Jacquelynn Matter.  He has known history of coronary artery disease status post PCI and 2 stent placement in March 2019 in the setting of angina with abnormal stress test.  No previous myocardial infarction.  He was found to have CTO of the LAD and severe disease of OM at that time.  CABG was considered but ultimately he underwent PCI with placement of a 3.0 by 38 mm Xience stent to the LAD and 3.5 x 18 mm Xience stent to OM postdilated to 5 mm.  He has known family history of premature coronary artery disease, hyperlipidemia and sleep apnea.  He uses CPAP.  He follows a vegetarian diet. He is a physician and stays active overall. 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  However, this was not obstructive and not associated with symptoms. He has been doing well and denies chest pain or shortness of breath.    Past Medical History:  Diagnosis Date   Coronary heart disease    2 stent placed March 2019   DDD (degenerative disc disease), cervical 11/19/2018   Difficult intubation    pt stated small airway   H/O heart artery stent    History of coronary artery disease 11/19/2018   Status post stent placement March 2019   History of seizures    No seizures in over 20 years.    Psoriasis 11/19/2018   clobetasol   Psoriatic arthritis (HCC)    On Cosentyx since 2017    Past Surgical History:  Procedure Laterality Date   COLONOSCOPY  2007 & 2016   Normal   COLONOSCOPY WITH PROPOFOL N/A 05/07/2021    Procedure: COLONOSCOPY WITH PROPOFOL;  Surgeon: Tami Falcon, MD;  Location: [facility];  Service: Endoscopy;  Laterality: N/A;   CYSTOSCOPY Right 2009   Removal of Ureteric Stone   CYSTOSCOPY  12/2019   ESOPHAGOGASTRODUODENOSCOPY (EGD) WITH PROPOFOL N/A 05/07/2021   Procedure: ESOPHAGOGASTRODUODENOSCOPY (EGD) WITH PROPOFOL;  Surgeon: Tami Falcon, MD;  Location: [facility];  Service: Endoscopy;  Laterality: N/A;   HEMORRHOID SURGERY  2004   External hemorrhoid removed    HERNIA REPAIR     INGUINAL HERNIA REPAIR  1976   PILONIDAL CYST / SINUS EXCISION  2001   THORACOTOMY  1997   and biopsy   UPPER GI ENDOSCOPY  2007 & 2016   Normal     Current Outpatient Medications  Medication Sig Dispense Refill   atorvastatin (LIPITOR) 40 MG tablet Take 1 tablet (40 mg total) by mouth daily. 90 tablet 0   clobetasol cream (TEMOVATE) 0.05 % Apply 1 Application topically 2 (two) times daily. (Patient taking differently: Apply 1 Application topically as needed.) 45 g 1   clopidogrel (PLAVIX) 75 MG tablet Take 1 tablet (75 mg total) by mouth daily. 90 tablet 0   Coenzyme Q10 200 MG capsule Take 200 mg by mouth daily.      Cyanocobalamin (VITAMIN B12) 1000 MCG  TBCR Take 1,000 mg by mouth 2 (two) times a week.     diclofenac Sodium (VOLTAREN) 1 % GEL Apply 2-4 grams to affected joint 4 times daily as needed. 400 g 2   fluticasone (FLONASE) 50 MCG/ACT nasal spray Place into both nostrils as needed for allergies or rhinitis.     ixekizumab (TALTZ) 80 MG/ML pen Inject 80 mg into the skin every 28 (twenty-eight) days. 3 mL 0   loratadine (CLARITIN) 10 MG tablet Take 10 mg by mouth daily as needed for allergies.     metoprolol succinate (TOPROL-XL) 25 MG 24 hr tablet Take 1/2 tablet (12.5 mg) by mouth daily. 90 tablet 3   Multiple Vitamin (MULTIVITAMIN PO) Take 1 tablet by mouth daily.     nitroGLYCERIN (NITROSTAT) 0.4 MG SL tablet Place 1 tablet (0.4 mg total) under the tongue every 5 (five) minutes as  needed for chest pain. 25 tablet 3   No current facility-administered medications for this visit.    Allergies:   Patient has no known allergies.    Social History:  The patient  reports that he quit smoking about 32 years ago. His smoking use included cigarettes. He has never been exposed to tobacco smoke. He has never used smokeless tobacco. He reports current alcohol use. He reports that he does not use drugs.   Family History:  The patient's family history includes Heart disease in his father.    ROS:  Please see the history of present illness.   Otherwise, review of systems are positive for none.   All other systems are reviewed and negative.    PHYSICAL EXAM: VS:  BP 130/72 (BP Location: Left Arm, Patient Position: Sitting, Cuff Size: Normal)   Pulse 80   Resp 16   Ht 5\' 9"  (1.753 m)   Wt 156 lb (70.8 kg)   SpO2 98%   BMI 23.04 kg/m  , BMI Body mass index is 23.04 kg/m. GEN: Well nourished, well developed, in no acute distress  HEENT: normal  Neck: no JVD, carotid bruits, or masses Cardiac: RRR; no rubs, or gallops,no edema .  1 out of 6 systolic murmur in the aortic area. Respiratory:  clear to auscultation bilaterally, normal work of breathing GI: soft, nontender, nondistended, + BS MS: no deformity or atrophy  Skin: warm and dry, no rash Neuro:  Strength and sensation are intact Psych: euthymic mood, full affect   EKG:  EKG is ordered today. The ekg ordered today demonstrates : Normal sinus rhythm Nonspecific T wave abnormality    Recent Labs: 12/15/2023: ALT 22; BUN 11; Creat 0.92; Hemoglobin 13.3; Platelets 235; Potassium 4.4; Sodium 140    Lipid Panel    Component Value Date/Time   CHOL 102 10/06/2019 0803   TRIG 107 10/06/2019 0803   HDL 37 (L) 10/06/2019 0803   CHOLHDL 2.8 10/06/2019 0803   LDLCALC 45 10/06/2019 0803      Wt Readings from Last 3 Encounters:  02/09/24 156 lb (70.8 kg)  08/18/23 159 lb 12.8 oz (72.5 kg)  03/19/23 156 lb (70.8  kg)          No data to display            ASSESSMENT AND PLAN:  1.  Coronary artery disease involving native coronary arteries without angina: He is doing reasonably well overall.  He has mild exertional dyspnea and he does have a heart murmur suggestive of aortic calcifications.  I requested an echocardiogram.  He is on long-term  clopidogrel therapy.  2.  Hyperlipidemia: Continue atorvastatin 40 mg once daily with a target LDL of less than 55.  Most recent lipid profile in October showed an LDL of 67.  We should consider switching to rosuvastatin or adding ezetimibe.  3.  Aortic atherosclerosis: With prior history of right external iliac artery dissection.  Velocities were mildly elevated before in 2020.  I requested a follow-up aortoiliac duplex to ensure stability and no aneurysm formation.  4.  Former smoker  5.  Prediabetes:    Disposition:   FU in 1 year.    Signed,  Antionette Kirks, MD  02/09/2024 3:25 PM    [facility]

## 2024-02-10 ENCOUNTER — Other Ambulatory Visit: Payer: Self-pay

## 2024-02-10 NOTE — Progress Notes (Signed)
 Specialty Pharmacy Refill Coordination Note  Reg Bircher is a 68 y.o. male contacted today regarding refills of specialty medication(s) Ixekizumab Altamease [place]iler)   Patient requested (Patient-Rptd) Delivery   Delivery date:  (patient requested invalid delivery date. will mail 4.10 and notify patient via mychart)   Verified address: (Patient-Rptd) [address] [place], [place] [address]   Medication will be filled on 04.10.25.

## 2024-02-11 ENCOUNTER — Other Ambulatory Visit: Payer: Self-pay

## 2024-02-16 ENCOUNTER — Ambulatory Visit: Payer: 59 | Admitting: Rheumatology

## 2024-02-24 ENCOUNTER — Other Ambulatory Visit (HOSPITAL_COMMUNITY): Payer: Self-pay

## 2024-02-24 ENCOUNTER — Other Ambulatory Visit: Payer: Self-pay

## 2024-02-24 ENCOUNTER — Other Ambulatory Visit: Payer: Self-pay | Admitting: Interventional Cardiology

## 2024-02-24 MED ORDER — ATOVAQUONE-PROGUANIL HCL 250-100 MG PO TABS
1.0000 | ORAL_TABLET | Freq: Every day | ORAL | 0 refills | Status: DC
  Filled 2024-02-24: qty 30, 30d supply, fill #0

## 2024-02-25 ENCOUNTER — Encounter: Payer: Self-pay | Admitting: Cardiovascular Disease

## 2024-02-26 ENCOUNTER — Other Ambulatory Visit (HOSPITAL_COMMUNITY): Payer: Self-pay

## 2024-02-26 MED ORDER — METOPROLOL SUCCINATE ER 25 MG PO TB24
12.5000 mg | ORAL_TABLET | Freq: Every day | ORAL | 3 refills | Status: AC
  Filled 2024-02-26 – 2024-03-15 (×2): qty 45, 90d supply, fill #0
  Filled 2024-06-06: qty 45, 90d supply, fill #1
  Filled 2024-09-13: qty 45, 90d supply, fill #2
  Filled 2024-12-08: qty 45, 90d supply, fill #3

## 2024-02-26 MED ORDER — CLOPIDOGREL BISULFATE 75 MG PO TABS
75.0000 mg | ORAL_TABLET | Freq: Every day | ORAL | 3 refills | Status: AC
  Filled 2024-02-26 – 2024-05-04 (×2): qty 90, 90d supply, fill #0
  Filled 2024-08-02: qty 90, 90d supply, fill #1
  Filled 2024-10-31: qty 90, 90d supply, fill #2

## 2024-03-02 ENCOUNTER — Encounter: Payer: Self-pay | Admitting: Rheumatology

## 2024-03-03 ENCOUNTER — Other Ambulatory Visit: Payer: Self-pay | Admitting: Rheumatology

## 2024-03-03 ENCOUNTER — Other Ambulatory Visit (HOSPITAL_COMMUNITY): Payer: Self-pay

## 2024-03-03 ENCOUNTER — Other Ambulatory Visit: Payer: Self-pay

## 2024-03-03 ENCOUNTER — Telehealth: Payer: Self-pay | Admitting: *Deleted

## 2024-03-03 DIAGNOSIS — Z79899 Other long term (current) drug therapy: Secondary | ICD-10-CM

## 2024-03-03 DIAGNOSIS — L4050 Arthropathic psoriasis, unspecified: Secondary | ICD-10-CM

## 2024-03-03 DIAGNOSIS — L409 Psoriasis, unspecified: Secondary | ICD-10-CM

## 2024-03-03 MED ORDER — TALTZ 80 MG/ML ~~LOC~~ SOAJ
80.0000 mg | SUBCUTANEOUS | 0 refills | Status: DC
  Filled 2024-03-03: qty 3, 84d supply, fill #0
  Filled 2024-03-15: qty 1, 28d supply, fill #0
  Filled 2024-04-13 – 2024-04-18 (×3): qty 1, 28d supply, fill #1
  Filled 2024-05-12: qty 1, 28d supply, fill #2

## 2024-03-03 NOTE — Telephone Encounter (Signed)
 Last Fill: 12/11/2023  Labs: 02/05/2024 Triglycerides 283   HDL Cholesterol 34   VLDL Cholesterol Cal 44    EOS (Absolute) 0.6  TB Gold: 05/26/2023 Neg    Next Visit: 05/12/2024  Last Visit: 08/18/2023  DX: Psoriatic arthritis    Current Dose per office note 08/18/2023: Taltz  80 mg subcu every 28 days   Okay to refill Taltz ?

## 2024-03-03 NOTE — Telephone Encounter (Signed)
 Labs received from:[facility]  Drawn on: 02/05/2024  Reviewed by: Dr. Nicholas Bari   Labs drawn:CMP, Lipid Panel,PSA, CBC, TSH, Hgb A1C  Results:Triglycerides 283   HDL Cholesterol 34   VLDL Cholesterol Cal 44    EOS (Absolute) 0.6  Patient is on Taltz  80 mg every 28 days.

## 2024-03-14 ENCOUNTER — Other Ambulatory Visit: Payer: Self-pay

## 2024-03-15 ENCOUNTER — Ambulatory Visit: Attending: Cardiovascular Disease

## 2024-03-15 ENCOUNTER — Other Ambulatory Visit: Payer: Self-pay

## 2024-03-15 ENCOUNTER — Ambulatory Visit (INDEPENDENT_AMBULATORY_CARE_PROVIDER_SITE_OTHER)

## 2024-03-15 ENCOUNTER — Other Ambulatory Visit (HOSPITAL_COMMUNITY): Payer: Self-pay

## 2024-03-15 DIAGNOSIS — I7772 Dissection of iliac artery: Secondary | ICD-10-CM | POA: Diagnosis not present

## 2024-03-15 DIAGNOSIS — R011 Cardiac murmur, unspecified: Secondary | ICD-10-CM | POA: Diagnosis not present

## 2024-03-15 LAB — ECHOCARDIOGRAM COMPLETE
AR max vel: 1.84 cm2
AV Area VTI: 2.19 cm2
AV Area mean vel: 1.89 cm2
AV Mean grad: 6 mmHg
AV Peak grad: 10 mmHg
Ao pk vel: 1.58 m/s
Area-P 1/2: 3.51 cm2
Calc EF: 54.5 %
S' Lateral: 2.7 cm
Single Plane A2C EF: 54.5 %
Single Plane A4C EF: 54 %

## 2024-03-15 NOTE — Progress Notes (Signed)
 Specialty Pharmacy Refill Coordination Note  Ricardo [name] is a 68 y.o. male contacted today regarding refills of specialty medication(s) Ixekizumab  (Taltz )   Patient requested (Patient-Rptd) Delivery   Delivery date: (Patient-Rptd) 03/18/24   [place] address: ([place]Rptd[address] [place], [place] [address]   Medication will be filled on 03/17/24.

## 2024-03-16 ENCOUNTER — Ambulatory Visit: Payer: Self-pay | Admitting: Cardiovascular Disease

## 2024-03-17 ENCOUNTER — Other Ambulatory Visit (HOSPITAL_COMMUNITY): Payer: Self-pay

## 2024-03-17 ENCOUNTER — Other Ambulatory Visit: Payer: Self-pay

## 2024-03-17 ENCOUNTER — Ambulatory Visit: Attending: Rheumatology | Admitting: Rheumatology

## 2024-03-17 ENCOUNTER — Encounter: Payer: Self-pay | Admitting: Rheumatology

## 2024-03-17 VITALS — BP 111/68 | HR 66 | Resp 14 | Ht 69.0 in | Wt 160.0 lb

## 2024-03-17 DIAGNOSIS — Z7901 Long term (current) use of anticoagulants: Secondary | ICD-10-CM | POA: Diagnosis not present

## 2024-03-17 DIAGNOSIS — M25562 Pain in left knee: Secondary | ICD-10-CM

## 2024-03-17 DIAGNOSIS — K2100 Gastro-esophageal reflux disease with esophagitis, without bleeding: Secondary | ICD-10-CM | POA: Diagnosis not present

## 2024-03-17 DIAGNOSIS — G4733 Obstructive sleep apnea (adult) (pediatric): Secondary | ICD-10-CM

## 2024-03-17 DIAGNOSIS — Z8679 Personal history of other diseases of the circulatory system: Secondary | ICD-10-CM | POA: Diagnosis not present

## 2024-03-17 DIAGNOSIS — L4050 Arthropathic psoriasis, unspecified: Secondary | ICD-10-CM | POA: Diagnosis not present

## 2024-03-17 DIAGNOSIS — G8929 Other chronic pain: Secondary | ICD-10-CM

## 2024-03-17 DIAGNOSIS — L409 Psoriasis, unspecified: Secondary | ICD-10-CM

## 2024-03-17 DIAGNOSIS — M1811 Unilateral primary osteoarthritis of first carpometacarpal joint, right hand: Secondary | ICD-10-CM | POA: Diagnosis not present

## 2024-03-17 DIAGNOSIS — M2142 Flat foot [pes planus] (acquired), left foot: Secondary | ICD-10-CM

## 2024-03-17 DIAGNOSIS — M2141 Flat foot [pes planus] (acquired), right foot: Secondary | ICD-10-CM | POA: Diagnosis not present

## 2024-03-17 DIAGNOSIS — R252 Cramp and spasm: Secondary | ICD-10-CM | POA: Diagnosis not present

## 2024-03-17 DIAGNOSIS — R7303 Prediabetes: Secondary | ICD-10-CM | POA: Diagnosis not present

## 2024-03-17 DIAGNOSIS — Z79899 Other long term (current) drug therapy: Secondary | ICD-10-CM

## 2024-03-17 DIAGNOSIS — Z8639 Personal history of other endocrine, nutritional and metabolic disease: Secondary | ICD-10-CM

## 2024-03-17 DIAGNOSIS — E785 Hyperlipidemia, unspecified: Secondary | ICD-10-CM

## 2024-03-17 DIAGNOSIS — M545 Low back pain, unspecified: Secondary | ICD-10-CM

## 2024-03-17 NOTE — Progress Notes (Signed)
 Office Visit Note  Patient: Ricardo [name]             Date of Birth: 09/12/1956           MRN: [id number]             PCP: Margarete Sharps, MD Referring: Margarete Sharps, MD Visit Date: 03/17/2024 Occupation: @GUAROCC @  Subjective:  Back muscle stiffness   History of Present Illness: Ricardo [name] is a 68 y.o. male with psoriatic arthritis, psoriasis and osteoarthritis.  He returns today for the follow-up visit after October 2024.  He states he recently went to [place] and came back.  He noticed some edema on his right lower extremity.  He has been also experiencing some discomfort in his lower back for the last few months.  He states its mostly on the left lateral lumbar region.  He believes is postural.  He has not had a flare of psoriatic arthritis psoriasis.  He states he has occasional discomfort in the left knee joint which happens when he is squatting.    Activities of Daily Living:  Patient reports morning stiffness for 1 hour.   Patient Denies nocturnal pain.  Difficulty dressing/grooming: Denies Difficulty climbing stairs: Denies Difficulty getting out of chair: Denies Difficulty using hands for taps, buttons, cutlery, and/or writing: Denies  Review of Systems  Constitutional:  Negative for fatigue.  HENT:  Negative for mouth sores and mouth dryness.   Eyes:  Negative for dryness.  Respiratory:  Negative for shortness of breath.   Cardiovascular:  Negative for chest pain and palpitations.  Gastrointestinal:  Negative for blood in stool, constipation and diarrhea.  Endocrine: Negative for increased urination.  Genitourinary:  Negative for involuntary urination.  Musculoskeletal:  Positive for joint pain, joint pain, morning stiffness and muscle tenderness. Negative for gait problem, joint swelling, myalgias, muscle weakness and myalgias.  Skin:  Negative for color change, rash, hair loss and sensitivity to sunlight.  Allergic/Immunologic: Negative for susceptible to infections.   Neurological:  Negative for dizziness and headaches.  Hematological:  Negative for swollen glands.  Psychiatric/Behavioral:  Negative for depressed mood and sleep disturbance. The patient is not nervous/anxious.     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   History of seizures as a child 07/21/2019   Snoring 07/21/2019   History of coronary artery disease 11/19/2018   Current use of anticoagulant therapy 11/19/2018   Psoriasis 11/19/2018   Psoriatic arthritis (HCC) 11/19/2018   High risk medication use 11/19/2018   DDD (degenerative disc disease), cervical 11/19/2018   Encounter for medication review 05/13/2018    Past Medical History:  Diagnosis Date   Coronary heart disease    2 stent placed March 2019   DDD (degenerative disc disease), cervical 11/19/2018   Difficult intubation    pt stated small airway   H/O heart artery stent    History of coronary artery disease 11/19/2018   Status post stent placement March 2019   History of seizures    No seizures in over 20 years.    Psoriasis 11/19/2018   clobetasol    Psoriatic arthritis (HCC)    On Cosentyx  since 2017    Family History  Problem Relation Age of Onset   Heart disease Father    Past Surgical History:  Procedure Laterality Date   COLONOSCOPY  2007 & 2016   Normal   COLONOSCOPY  WITH PROPOFOL  N/A 05/07/2021   Procedure: COLONOSCOPY WITH PROPOFOL ;  Surgeon: Tami Falcon, MD;  Location: [facility];  Service: Endoscopy;  Laterality: N/A;   CYSTOSCOPY Right 2009   Removal of Ureteric Stone   CYSTOSCOPY  12/2019   ESOPHAGOGASTRODUODENOSCOPY (EGD) WITH PROPOFOL  N/A 05/07/2021   Procedure: ESOPHAGOGASTRODUODENOSCOPY (EGD) WITH PROPOFOL ;  Surgeon: Tami Falcon, MD;  Location: [facility];  Service: Endoscopy;  Laterality: N/A;   HEMORRHOID SURGERY  2004   External hemorrhoid removed    HERNIA REPAIR     INGUINAL  HERNIA REPAIR  1976   PILONIDAL CYST / SINUS EXCISION  2001   THORACOTOMY  1997   and biopsy   UPPER GI ENDOSCOPY  2007 & 2016   Normal   Social History   Social History Narrative   Right handed   3 cups coffee per day       Immunization History  Administered Date(s) Administered   PFIZER(Purple Top)SARS-COV-2 Vaccination 10/23/2019, 11/14/2019, 07/13/2020, 07/21/2021   Pfizer(Comirnaty )Fall Seasonal Vaccine 12 years and older 09/03/2022, 07/21/2023     Objective: Vital Signs: BP 111/68 (BP Location: Left Arm, Patient Position: Sitting, Cuff Size: Normal)   Pulse 66   Resp 14   Ht 5\' 9"  (1.753 m)   Wt 160 lb (72.6 kg)   BMI 23.63 kg/m    Physical Exam Vitals and nursing note reviewed.  Constitutional:      Appearance: He is well-developed.  HENT:     Head: Normocephalic and atraumatic.  Eyes:     Conjunctiva/sclera: Conjunctivae normal.     Pupils: Pupils are equal, round, and reactive to light.  Cardiovascular:     Rate and Rhythm: Normal rate and regular rhythm.     Heart sounds: Normal heart sounds.  Pulmonary:     Effort: Pulmonary effort is normal.     Breath sounds: Normal breath sounds.  Abdominal:     General: Bowel sounds are normal.     Palpations: Abdomen is soft.  Musculoskeletal:     Cervical back: Normal range of motion and neck supple.  Skin:    General: Skin is warm and dry.     Capillary Refill: Capillary refill takes less than 2 seconds.  Neurological:     Mental Status: He is alert and oriented to person, place, and time.  Psychiatric:        Behavior: Behavior normal.      Musculoskeletal Exam: Cervical, thoracic and lumbar spine with good range of motion.  He had some discomfort in the left lateral lumbar region.  Thoracic kyphosis was noted.  Shoulders, elbows, wrist joints, MCPs PIPs and DIPs with good range of motion.  No synovitis was noted.  He had limited extension of the right 3rd and 5th PIP joints.  Hip joints and knee joints  with good range of motion.  No warmth swelling or effusion was noted.  Crepitus was noted in the left knee joint.  There was no tenderness over ankles or MTPs.  No plantar fasciitis or Achilles tendinitis was noted.  CDAI Exam: CDAI Score: -- Patient Global: --; Provider Global: -- Swollen: --; Tender: -- Joint Exam 03/17/2024   No joint exam has been documented for this visit   There is currently no information documented on the homunculus. Go to the Rheumatology activity and complete the homunculus joint exam.  Investigation: No additional findings.  Imaging: VAS US  AAA DUPLEX Result Date: 03/17/2024 ABDOMINAL AORTA STUDY Patient Name:  [name]  Date of Exam:  03/15/2024 Medical Rec #: [id number]         Accession #:    [id number] Date of Birth: 01-Nov-1956        Patient Gender: M Patient Age:   45 years Exam Location:  [place] Procedure:      VAS US  AAA DUPLEX Referring Phys: [facility] ARIDA --------------------------------------------------------------------------------  Indications: Follow-up right external iliac artery dissection Risk Factors: Hyperlipidemia, past history of smoking, coronary artery disease.  Comparison Study: 10/05/19 aorta duplex exam showed right external iliac artery                   velocity of 300 cm/s Performing Technologist: Malena Scull RDMS, RVT, RDCS  Examination Guidelines: A complete evaluation includes B-mode imaging, spectral Doppler, color Doppler, and power Doppler as needed of all accessible portions of each vessel. Bilateral testing is considered an integral part of a complete examination. Limited examinations for reoccurring indications may be performed as noted.  Abdominal Aorta Findings: +-------------+-------+----------+----------+---------+--------+--------+ Location     AP (cm)Trans (cm)PSV (cm/s)Waveform ThrombusComments +-------------+-------+----------+----------+---------+--------+--------+ Proximal     1.94   1.82      91         triphasic                 +-------------+-------+----------+----------+---------+--------+--------+ Mid          1.66   1.61      78        triphasic                 +-------------+-------+----------+----------+---------+--------+--------+ Distal       1.54   1.60      105       triphasic                 +-------------+-------+----------+----------+---------+--------+--------+ RT CIA Prox                   133       triphasic                 +-------------+-------+----------+----------+---------+--------+--------+ RT CIA Mid                    93        triphasic                 +-------------+-------+----------+----------+---------+--------+--------+ RT CIA Distal                 140       triphasic                 +-------------+-------+----------+----------+---------+--------+--------+ RT EIA Prox                   222       biphasic                  +-------------+-------+----------+----------+---------+--------+--------+ RT EIA Mid                    217       triphasic                 +-------------+-------+----------+----------+---------+--------+--------+ RT EIA Distal                 210       triphasic                 +-------------+-------+----------+----------+---------+--------+--------+ LT CIA Prox  132       triphasic                 +-------------+-------+----------+----------+---------+--------+--------+ LT CIA Mid                    125       triphasic                 +-------------+-------+----------+----------+---------+--------+--------+ LT CIA Distal                 103       triphasic                 +-------------+-------+----------+----------+---------+--------+--------+ LT EIA Prox                   113       triphasic                 +-------------+-------+----------+----------+---------+--------+--------+ LT EIA Mid                    102       biphasic                   +-------------+-------+----------+----------+---------+--------+--------+ LT EIA Distal                 150       biphasic                  +-------------+-------+----------+----------+---------+--------+--------+ IVC/Iliac Findings: +-------+------+--------+--------+   IVC  PatentThrombusComments +-------+------+--------+--------+ IVC Midpatent                 +-------+------+--------+--------+    Summary: Abdominal Aorta: No evidence of an abdominal aortic aneurysm was visualized. The largest aortic measurement is 1.9 cm. The largest aortic diameter remains essentially unchanged compared to prior exam. Previous diameter measurement was 2.0 cm obtained on 10/05/19. Stenosis: +--------------------+-------------+ Location            Stenosis      +--------------------+-------------+ Right External Iliac>50% stenosis +--------------------+-------------+   *See table(s) above for measurements and observations. Suggest follow up study in 12 months.  Electronically signed by Armida Lander MD on 03/17/2024 at 11:49:22 AM.    Final    ECHOCARDIOGRAM COMPLETE Result Date: 03/15/2024    ECHOCARDIOGRAM REPORT   Patient Name:   [name] Date of Exam: 03/15/2024 Medical Rec #:  [id number]        Height:       69.0 in Accession #:    [id number]       Weight:       156.0 lb Date of Birth:  1956/07/07       BSA:          1.859 m Patient Age:    67 years         BP:           130/72 mmHg Patient Gender: M                HR:           58 bpm. Exam Location:  [facility] Procedure: 2D Echo, Cardiac Doppler and Color Doppler (Both Spectral and Color            Flow Doppler were utilized during procedure). Indications:    R01.1 Murmur  History:        Patient has prior history of Echocardiogram examinations, most  recent 09/19/2020. CAD, Signs/Symptoms:Murmur; Risk                 Factors:Dyslipidemia, Sleep Apnea and Former Smoker.  Sonographer:    Malena Scull RDMS, RVT, RDCS  Referring Phys: [id number] [address] A ARIDA IMPRESSIONS  1. Left ventricular ejection fraction, by estimation, is 60 to 65%. The left ventricle has normal function. The left ventricle has no regional wall motion abnormalities. Left ventricular diastolic parameters are consistent with Grade I diastolic dysfunction (impaired relaxation).  2. Right ventricular systolic function is normal. The right ventricular size is normal.  3. The mitral valve is normal in structure. Mild mitral valve regurgitation. No evidence of mitral stenosis.  4. The aortic valve is normal in structure. Aortic valve regurgitation is not visualized. Aortic valve sclerosis is present, with no evidence of aortic valve stenosis.  5. The inferior vena cava is normal in size with greater than 50% respiratory variability, suggesting right atrial pressure of 3 mmHg. FINDINGS  Left Ventricle: Left ventricular ejection fraction, by estimation, is 60 to 65%. The left ventricle has normal function. The left ventricle has no regional wall motion abnormalities. Strain was performed and the global longitudinal strain is indeterminate. The left ventricular internal cavity size was normal in size. There is no left ventricular hypertrophy. Left ventricular diastolic parameters are consistent with Grade I diastolic dysfunction (impaired relaxation). Right Ventricle: The right ventricular size is normal. No increase in right ventricular wall thickness. Right ventricular systolic function is normal. Left Atrium: Left atrial size was normal in size. Right Atrium: Right atrial size was normal in size. Pericardium: There is no evidence of pericardial effusion. Mitral Valve: The mitral valve is normal in structure. Mild mitral valve regurgitation. No evidence of mitral valve stenosis. Tricuspid Valve: The tricuspid valve is normal in structure. Tricuspid valve regurgitation is not demonstrated. No evidence of tricuspid stenosis. Aortic Valve: The aortic valve is normal in  structure. Aortic valve regurgitation is not visualized. Aortic valve sclerosis is present, with no evidence of aortic valve stenosis. Aortic valve mean gradient measures 6.0 mmHg. Aortic valve peak gradient measures 10.0 mmHg. Aortic valve area, by VTI measures 2.19 cm. Pulmonic Valve: The pulmonic valve was normal in structure. Pulmonic valve regurgitation is not visualized. No evidence of pulmonic stenosis. Aorta: The aortic root is normal in size and structure. Venous: The inferior vena cava is normal in size with greater than 50% respiratory variability, suggesting right atrial pressure of 3 mmHg. IAS/Shunts: No atrial level shunt detected by color flow Doppler. Additional Comments: 3D was performed not requiring image post processing on an independent workstation and was indeterminate.  LEFT VENTRICLE PLAX 2D LVIDd:         3.90 cm     Diastology LVIDs:         2.70 cm     LV e' medial:    6.96 cm/s LV PW:         1.00 cm     LV E/e' medial:  10.8 LV IVS:        0.90 cm     LV e' lateral:   11.40 cm/s LVOT diam:     2.00 cm     LV E/e' lateral: 6.6 LV SV:         71 LV SV Index:   38 LVOT Area:     3.14 cm  LV Volumes (MOD) LV vol d, MOD A2C: 81.3 ml LV vol d, MOD A4C: 85.5 ml LV vol s, MOD  A2C: 37.0 ml LV vol s, MOD A4C: 39.3 ml LV SV MOD A2C:     44.3 ml LV SV MOD A4C:     85.5 ml LV SV MOD BP:      45.6 ml RIGHT VENTRICLE RV S prime:     10.20 cm/s TAPSE (M-mode): 2.6 cm LEFT ATRIUM             Index        RIGHT ATRIUM           Index LA diam:        3.30 cm 1.78 cm/m   RA Area:     11.80 cm LA Vol (A2C):   39.9 ml 21.46 ml/m  RA Volume:   26.80 ml  14.42 ml/m LA Vol (A4C):   46.8 ml 25.18 ml/m LA Biplane Vol: 45.2 ml 24.31 ml/m  AORTIC VALVE                     PULMONIC VALVE AV Area (Vmax):    1.84 cm      PV Vmax:       0.95 m/s AV Area (Vmean):   1.89 cm      PV Peak grad:  3.6 mmHg AV Area (VTI):     2.19 cm AV Vmax:           158.00 cm/s AV Vmean:          114.000 cm/s AV VTI:             0.326 m AV Peak Grad:      10.0 mmHg AV Mean Grad:      6.0 mmHg LVOT Vmax:         92.60 cm/s LVOT Vmean:        68.500 cm/s LVOT VTI:          0.227 m LVOT/AV VTI ratio: 0.70  AORTA Ao Root diam: 3.00 cm Ao Asc diam:  2.80 cm MITRAL VALVE               TRICUSPID VALVE MV Area (PHT): 3.51 cm    TR Peak grad:   23.4 mmHg MV Decel Time: 216 msec    TR Vmax:        242.00 cm/s MV E velocity: 74.90 cm/s MV A velocity: 87.00 cm/s  SHUNTS MV E/A ratio:  0.86        Systemic VTI:  0.23 m                            Systemic Diam: 2.00 cm Belva Boyden MD Electronically signed by Belva Boyden MD Signature Date/Time: 03/15/2024/12:10:39 PM    Final     Recent Labs: Lab Results  Component Value Date   WBC 6.6 12/15/2023   HGB 13.3 12/15/2023   PLT 235 12/15/2023   NA 140 12/15/2023   K 4.4 12/15/2023   CL 105 12/15/2023   CO2 28 12/15/2023   GLUCOSE 96 12/15/2023   BUN 11 12/15/2023   CREATININE 0.92 12/15/2023   BILITOT 0.5 12/15/2023   ALKPHOS 79 01/13/2020   AST 24 12/15/2023   ALT 22 12/15/2023   PROT 6.8 12/15/2023   ALBUMIN 4.4 01/13/2020   CALCIUM  9.5 12/15/2023   GFRAA 109 05/10/2021   QFTBGOLDPLUS NEGATIVE 05/26/2023    Speciality Comments: Inadequate response to methotrexate, Enbrel, Humira Cosyntex November 2017-April 2023 Taltz -03/05/22  Procedures:  No procedures performed Allergies: Patient  has no known allergies.   Assessment / Plan:     Visit Diagnoses: Psoriatic arthritis (HCC) - diagnosed in 1999, treated by Dr. Sherral Do in [place] -he had inadequate response to methotrexate, Enbrel, Humira and Cosentyx .  He has been on Toltz since 2023 and has been tolerating it well.  He has not had any flares of psoriatic arthritis since he has been on Taltz .  He denies any history of joint swelling, plantar fasciitis, Achilles tendinitis or uveitis.  No synovitis was noted on the examination today.  Psoriasis-he had no active psoriasis patches.  High risk medication use - Taltz  80  mg subcu every 28 days started on Mar 05, 2022.  Previous treatment methotrexate, Enbrel, Humira, Cosentyx .  December 15, 2023 CBC and CMP were normal.  He also had some recent labs in April which showed CBC and CMP were normal.  Triglycerides were elevated.  Hemoglobin A1c was elevated.  TB Gold was negative in July 2024.  Will continue to check labs in July and every 3 months.  TB Gold will be checked with his next labs.  Arthritis of carpometacarpal (CMC) joint of right thumb-currently not symptomatic.  He has discomfort related to his work.  Chronic pain of left knee -he complains of some discomfort in his left knee joint when he is squatting.  Crepitus was noted.  Lower extremity muscle strengthening exercises and joint protection was discussed.  Muscle cramps-improvement noted.  Pes planus of both feet-he has been using shoes with arch support which has been helpful.  Chronic left-sided low back pain without sciatica -the pain appears to be muscular.  Stretching exercises were demonstrated in the office and a handout was given.  I also offered referral to physical therapy if his symptoms do not improve.  Patient will contact me if any is ready for physical therapy.  Current use of anticoagulant therapy  Dyslipidemia-elevated triglycerides and LDL.  Will check fasting lipid panel with his next labs.  History of coronary artery disease - Status post stent placement March 2019.  Followed by cardiology.  Prediabetes-I will check hemoglobin A1c with his next labs.  Gastroesophageal reflux disease with esophagitis without hemorrhage  History of vitamin D  deficiency-vitamin D  was normal in January 2023.  Obstructive sleep apnea syndrome  Orders: Orders Placed This Encounter  Procedures   CBC with Differential/Platelet   Comprehensive metabolic panel with GFR   Lipid panel   Hemoglobin A1c   QuantiFERON-TB Gold Plus   No orders of the defined types were placed in this  encounter.    Follow-Up Instructions: Return in about 6 months (around 09/17/2024) for Psoriatic arthritis.   Nicholas Bari, MD  Note - This record has been created using Animal nutritionist.  Chart creation errors have been sought, but may not always  have been located. Such creation errors do not reflect on  the standard of medical care.

## 2024-03-17 NOTE — Patient Instructions (Addendum)
 Exercises for Chronic Knee Pain Chronic knee pain is pain that lasts longer than 3 months. For most people with chronic knee pain, exercise and weight loss is an important part of treatment. Your health care provider may want you to focus on: Making the muscles that support your knee stronger. This can take pressure off your knee and reduce pain. Preventing knee stiffness. How far you can move your knee, keeping it there or making it farther. Losing weight (if this applies) to take pressure off your knee, lower your risk for injury, and make it easier for you to exercise. Your provider will help you make an exercise program that fits your needs and physical abilities. Below are simple, low-impact exercises you can do at home. Ask your provider or physical therapist how often you should do your exercise program and how many times to repeat each exercise. General safety tips  Get your provider's approval before doing any exercises. Start slowly and stop any time you feel pain. Do not exercise if your knee pain is flaring up. Warm up first. Stretching a cold muscle can cause an injury. Do 5-10 minutes of easy movement or light stretching before beginning your exercises. Do 5-10 minutes of low-impact activity (like walking or cycling) before starting strengthening exercises. Contact your provider any time you have pain during or after exercising. Exercise can cause discomfort but should not be painful. It is normal to be a little stiff or sore after exercising. Stretching and range-of-motion exercises Front thigh stretch  Stand up straight and support your body by holding on to a chair or resting one hand on a wall. With your legs straight and close together, bend one knee to lift your heel up toward your butt. Using one hand for support, grab your ankle with your free hand. Pull your foot up closer toward your butt to feel the stretch in front of your thigh. Hold the stretch for 30  seconds. Repeat __________ times. Complete this exercise __________ times a day. Back thigh stretch  Sit on the floor with your back straight and your legs out straight in front of you. Place the palms of your hands on the floor and slide them toward your feet as you bend at the hip. Try to touch your nose to your knees and feel the stretch in the back of your thighs. Hold for 30 seconds. Repeat __________ times. Complete this exercise __________ times a day. Calf stretch  Stand facing a wall. Place the palms of your hands flat against the wall, arms extended, and lean slightly against the wall. Get into a lunge position with one leg bent at the knee and the other leg stretched out straight behind you. Keep both feet facing the wall and increase the bend in your knee while keeping the heel of the other leg flat on the ground. You should feel the stretch in your calf. Hold for 30 seconds. Repeat __________ times. Complete this exercise __________ times a day. Strengthening exercises Straight leg lift  Lie on your back with one knee bent and the other leg out straight. Slowly lift the straight leg without bending the knee. Lift until your foot is about 12 inches (30 cm) off the floor. Hold for 3-5 seconds and slowly lower your leg. Repeat __________ times. Complete this exercise __________ times a day. Single leg dip  Stand between two chairs and put both hands on the backs of the chairs for support. Extend one leg out straight with your body  weight resting on the heel of the standing leg. Slowly bend your standing knee to dip your body to the level that is comfortable for you. Hold for 3-5 seconds. Repeat __________ times. Complete this exercise __________ times a day. Hamstring curls  Stand straight, knees close together, facing the back of a chair. Hold on to the back of a chair with both hands. Keep one leg straight. Bend the other knee while bringing the heel up toward the butt  until the knee is bent at a 90-degree angle (right angle). Hold for 3-5 seconds. Repeat __________ times. Complete this exercise __________ times a day. Wall squat  Stand straight with your back, hips, and head against a wall. Step forward one foot at a time with your back still against the wall. Your feet should be 2 feet (61 cm) from the wall at shoulder width. Keeping your back, hips, and head against the wall, slide down the wall to as close to a sitting position as you can get. Hold for 5-10 seconds, then slowly slide back up. Repeat __________ times. Complete this exercise __________ times a day. Step-ups  Stand in front of a sturdy platform or stool that is about 6 inches (15 cm) high. Slowly step up with your left / right foot, keeping your knee in line with your hip and foot. Do not let your knee bend so far that you cannot see your toes. Hold on to a chair for balance, but do not use it for support. Slowly unlock your knee and lower yourself to the starting position. Repeat __________ times. Complete this exercise __________ times a day. Contact a health care provider if: Your exercises cause pain. Your pain is worse after you exercise. Your pain prevents you from doing your exercises. This information is not intended to replace advice given to you by your health care provider. Make sure you discuss any questions you have with your health care provider. Document Revised: 11/04/2022 Document Reviewed: 11/04/2022 [employer] Patient Education  2024 [employer]  Low Back Sprain or Strain Rehab Ask your health care provider which exercises are safe for you. Do exercises exactly as told by your health care provider and adjust them as directed. It is normal to feel mild stretching, pulling, tightness, or discomfort as you do these exercises. Stop right away if you feel sudden pain or your pain gets worse. Do not begin these exercises until told by your health care provider. Stretching  and range-of-motion exercises These exercises warm up your muscles and joints and improve the movement and flexibility of your back. These exercises also help to relieve pain, numbness, and tingling. Lumbar rotation  Lie on your back on a firm bed or the floor with your knees bent. Straighten your arms out to your sides so each arm forms a 90-degree angle (right angle) with a side of your body. Slowly move (rotate) both of your knees to one side of your body until you feel a stretch in your lower back (lumbar). Try not to let your shoulders lift off the floor. Hold this position for __________ seconds. Tense your abdominal muscles and slowly move your knees back to the starting position. Repeat this exercise on the other side of your body. Repeat __________ times. Complete this exercise __________ times a day. Single knee to chest  Lie on your back on a firm bed or the floor with both legs straight. Bend one of your knees. Use your hands to move your knee up toward your  chest until you feel a gentle stretch in your lower back and buttock. Hold your leg in this position by holding on to the front of your knee. Keep your other leg as straight as possible. Hold this position for __________ seconds. Slowly return to the starting position. Repeat with your other leg. Repeat __________ times. Complete this exercise __________ times a day. Prone extension on elbows  Lie on your abdomen on a firm bed or the floor (prone position). Prop yourself up on your elbows. Use your arms to help lift your chest up until you feel a gentle stretch in your abdomen and your lower back. This will place some of your body weight on your elbows. If this is uncomfortable, try stacking pillows under your chest. Your hips should stay down, against the surface that you are lying on. Keep your hip and back muscles relaxed. Hold this position for __________ seconds. Slowly relax your upper body and return to the  starting position. Repeat __________ times. Complete this exercise __________ times a day. Strengthening exercises These exercises build strength and endurance in your back. Endurance is the ability to use your muscles for a long time, even after they get tired. Pelvic tilt This exercise strengthens the muscles that lie deep in the abdomen. Lie on your back on a firm bed or the floor with your legs extended. Bend your knees so they are pointing toward the ceiling and your feet are flat on the floor. Tighten your lower abdominal muscles to press your lower back against the floor. This motion will tilt your pelvis so your tailbone points up toward the ceiling instead of pointing to your feet or the floor. To help with this exercise, you may place a small towel under your lower back and try to push your back into the towel. Hold this position for __________ seconds. Let your muscles relax completely before you repeat this exercise. Repeat __________ times. Complete this exercise __________ times a day. Alternating arm and leg raises  Get on your hands and knees on a firm surface. If you are on a hard floor, you may want to use padding, such as an exercise mat, to cushion your knees. Line up your arms and legs. Your hands should be directly below your shoulders, and your knees should be directly below your hips. Lift your left leg behind you. At the same time, raise your right arm and straighten it in front of you. Do not lift your leg higher than your hip. Do not lift your arm higher than your shoulder. Keep your abdominal and back muscles tight. Keep your hips facing the ground. Do not arch your back. Keep your balance carefully, and do not hold your breath. Hold this position for __________ seconds. Slowly return to the starting position. Repeat with your right leg and your left arm. Repeat __________ times. Complete this exercise __________ times a day. Abdominal set with straight leg  raise  Lie on your back on a firm bed or the floor. Bend one of your knees and keep your other leg straight. Tense your abdominal muscles and lift your straight leg up, 4-6 inches (10-15 cm) off the ground. Keep your abdominal muscles tight and hold this position for __________ seconds. Do not hold your breath. Do not arch your back. Keep it flat against the ground. Keep your abdominal muscles tense as you slowly lower your leg back to the starting position. Repeat with your other leg. Repeat __________ times. Complete this exercise __________  times a day. Single leg lower with bent knees Lie on your back on a firm bed or the floor. Tense your abdominal muscles and lift your feet off the floor, one foot at a time, so your knees and hips are bent in 90-degree angles (right angles). Your knees should be over your hips and your lower legs should be parallel to the floor. Keeping your abdominal muscles tense and your knee bent, slowly lower one of your legs so your toe touches the ground. Lift your leg back up to return to the starting position. Do not hold your breath. Do not let your back arch. Keep your back flat against the ground. Repeat with your other leg. Repeat __________ times. Complete this exercise __________ times a day. Posture and body mechanics Good posture and healthy body mechanics can help to relieve stress in your body's tissues and joints. Body mechanics refers to the movements and positions of your body while you do your daily activities. Posture is part of body mechanics. Good posture means: Your spine is in its natural S-curve position (neutral). Your shoulders are pulled back slightly. Your head is not tipped forward (neutral). Follow these guidelines to improve your posture and body mechanics in your everyday activities. Standing  When standing, keep your spine neutral and your feet about hip-width apart. Keep a slight bend in your knees. Your ears, shoulders, and  hips should line up. When you do a task in which you stand in one place for a long time, place one foot up on a stable object that is 2-4 inches (5-10 cm) high, such as a footstool. This helps keep your spine neutral. Sitting  When sitting, keep your spine neutral and keep your feet flat on the floor. Use a footrest, if necessary, and keep your thighs parallel to the floor. Avoid rounding your shoulders, and avoid tilting your head forward. When working at a desk or a computer, keep your desk at a height where your hands are slightly lower than your elbows. Slide your chair under your desk so you are close enough to maintain good posture. When working at a computer, place your monitor at a height where you are looking straight ahead and you do not have to tilt your head forward or downward to look at the screen. Resting When lying down and resting, avoid positions that are most painful for you. If you have pain with activities such as sitting, bending, stooping, or squatting, lie in a position in which your body does not bend very much. For example, avoid curling up on your side with your arms and knees near your chest (fetal position). If you have pain with activities such as standing for a long time or reaching with your arms, lie with your spine in a neutral position and bend your knees slightly. Try the following positions: Lying on your side with a pillow between your knees. Lying on your back with a pillow under your knees. Lifting  When lifting objects, keep your feet at least shoulder-width apart and tighten your abdominal muscles. Bend your knees and hips and keep your spine neutral. It is important to lift using the strength of your legs, not your back. Do not lock your knees straight out. Always ask for help to lift heavy or awkward objects. This information is not intended to replace advice given to you by your health care provider. Make sure you discuss any questions you have with your  health care provider. Document Revised: 02/23/2023 Document  Reviewed: 01/07/2021 [employer] Patient Education  2024 [employer]  Standing Labs We placed an order today for your standing lab work.   Please have your standing labs drawn in  July and every  3 months  Please have your labs drawn 2 weeks prior to your appointment so that the provider can discuss your lab results at your appointment, if possible.  Please note that you may see your imaging and lab results in MyChart before we have reviewed them. We will contact you once all results are reviewed. Please allow our office up to 72 hours to thoroughly review all of the results before contacting the office for clarification of your results.  WALK-IN LAB HOURS  Monday through Thursday from 8:00 am -12:30 pm and 1:00 pm-4:00 pm and Friday from 8:00 am-12:00 pm.  Patients with office visits requiring labs will be seen before walk-in labs.  You may encounter longer than normal wait times. Please allow additional time. Wait times may be shorter on  Monday and Thursday afternoons.  We do not book appointments for walk-in labs. We appreciate your patience and understanding with our staff.   Labs are drawn by [facility]. Please bring your co-pay at the time of your lab draw.  You may receive a bill from [facility] for your lab work.  Please note if you are on Hydroxychloroquine and and an order has been placed for a Hydroxychloroquine level,  you will need to have it drawn 4 hours or more after your last dose.  If you wish to have your labs drawn at another location, please call the office 24 hours in advance so we can fax the orders.  The office is located at [address], [address], [place], [place] [address]   If you have any questions regarding directions or hours of operation,  please call [phone].   As a reminder, please drink plenty of water prior to coming for your lab work. Thanks!  Vaccines You are taking a medication(s) that  can suppress your immune system.  The following immunizations are recommended: Flu annually Covid-19  Td/Tdap (tetanus, diphtheria, pertussis) every 10 years Pneumonia (Prevnar 15 then Pneumovax 23 at least 1 year apart.  Alternatively, can take Prevnar 20 without needing additional dose) Shingrix: 2 doses from 4 weeks to 6 months apart  Please check with your PCP to make sure you are up to date.   If you have signs or symptoms of an infection or start antibiotics: First, call your PCP for workup of your infection. Hold your medication through the infection, until you complete your antibiotics, and until symptoms resolve if you take the following: Injectable medication (Actemra, Benlysta, Cimzia, Cosentyx , Enbrel, Humira, Kevzara, Orencia, Remicade, Simponi, Stelara, Taltz , Tremfya) Methotrexate Leflunomide (Arava) Mycophenolate (Cellcept) Cloria Danger, Olumiant, or Rinvoq

## 2024-03-23 NOTE — Progress Notes (Signed)
 Ricardo [name]

## 2024-03-24 ENCOUNTER — Ambulatory Visit (INDEPENDENT_AMBULATORY_CARE_PROVIDER_SITE_OTHER): Payer: 59 | Admitting: Neurology

## 2024-03-24 ENCOUNTER — Encounter: Payer: Self-pay | Admitting: Neurology

## 2024-03-24 VITALS — BP 126/75 | HR 67 | Ht 69.0 in | Wt 159.6 lb

## 2024-03-24 DIAGNOSIS — G4733 Obstructive sleep apnea (adult) (pediatric): Secondary | ICD-10-CM

## 2024-03-24 DIAGNOSIS — I7772 Dissection of iliac artery: Secondary | ICD-10-CM

## 2024-03-24 DIAGNOSIS — L4050 Arthropathic psoriasis, unspecified: Secondary | ICD-10-CM

## 2024-03-24 NOTE — Progress Notes (Signed)
 Provider:  [name], MD  Primary Care Physician:  Margarete Sharps, MD [address] [place] [address]     Referring Provider: Margarete Sharps, Md [address] 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  Chief concern according to patient : " I am doing well on  my CPAP and sleep well. 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  I am getting 7 hours of sleep.  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 He is still full time employed and commercially insured and would like to receive the new device now, rather than later.           He was greatly surprised when he was  diagnosed with a rather severe form of sleep apnea.  His sleep study was performed here at [facility] with an expanded EEG montage:      [name], M.D. 08-06-2019.ccnewpatient Slept for 57.6% of the recorded time.  The oxygen nadir was 78% the total time spent below 89% SPO2 equal 20 minutes.  There were several arousals most of them spontaneous but 43 of them associated with respiratory events.  There were 106 obstructive apneas 1 mixed apnea and 19 shallow breathing spells-hypopneas.  This amounts to an AHI per hour of sleep of 32.1.  In rem sleep the apnea index was accelerated to 52.1/h and the patient briefly resumed supine sleep he was noted to snore loudly.   He started  Using an auto CPAP, would ave liked to use a dental device , but his kind of apnea is not effectively responding to dental devices.    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  He is using  an air sense 10 AutoSet with a serial #[id number] the minimum pressure is 6 the maximum pressure of 16 cmH2O with 1 cm expiratory pressure relief.  The 95 percentile pressure is 8.5 cmH2O well within his current settings.  His residual AHI is 0.3/h which is an excellent resolution of his not mild baseline apnea.  No central apneas have emerged.    He used first a FFM but had aerophagia and now is on nasal pillows.  He hasn't been sleepy in daytime an that hasn't changed, normally gets a good 5 hours of sleep in bloc, and then another 1-2 hours.    Given the symptoms of dry mouth and ongoing burping, and dry airway- throat irritation, feeling parched we can attribute this to oral air flow.        Last visit :Chief concern according to patient : " I am woken up in the middle of the night with palpitations, and my wife stated I snore more"  and " I also have a brother with OSA diagnosis. I had a cardiac stent placed last year, now followed by Dr. Jacquelynn Matter ".  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  He has a past medical history of Coronary heart disease, DDD (degenerative disc disease), cervical (11/19/2018), H/O heart artery stent, History of coronary artery disease (11/19/2018), History of seizures, Psoriasis (11/19/2018), and Psoriatic arthritis (HCC)..  He had a seizure disorder in his teens and early twenties, had an overnight study in [place], that was in 1970. He took Carbamazepine for 20 years. He is not sure how to differentiate a  seizure form a nightmare, it felt similar. He was observed to have rhythmic head movements. He felt as if being touched in his sleep. Never had any event in daytime. He experienced sleep paralysis. He has been followed for psoriatic arthritis.     Family medical /sleep history: one brother with OSA, his older brother had CAD and Stent.     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 The couple has no children,.  The patient currently works/ used to work in shifts( Chief Technology Officer,) Pets are not present. Tobacco use: in college - now a party cigar smoker, none since stent .  ETOH use: drink 1 /month , Caffeine intake in form of Coffee( 2-3 /d) Soda( none ) Tea ( none ) ,no  energy drinks. Regular exercise in form of walking , was a regular at the gym.          Review of Systems: Out of a complete 14 system review, the patient complains of only the following symptoms, and all other reviewed systems are negative.:   Epworth Sleepiness Scale endorsed at 2 out of 24 points, fatigue severity scale at 11 out of 63 points, geriatric depression scale at 0 out of 15 points.  Medication list reviewed,  Social History   Socioeconomic History   Marital status: Married    Spouse name: Not on file   Number of children: 0   Years of education: Not on file   Highest education level: Not on file  Occupational History   Not on file  Tobacco Use   Smoking status: Former    Current packs/day: 0.00    Types: Cigarettes    Quit date: 53    Years since quitting: 32.4    Passive exposure: Never   Smokeless tobacco: Never  Vaping Use   Vaping status: Never Used  Substance and Sexual Activity   Alcohol use: Yes    Comment: socially, rarely   Drug use: Never   Sexual activity: Not on file  Other Topics Concern   Not on file  Social History Narrative   Right handed   3 cups coffee per day       Social Drivers of Health   Financial Resource Strain: Not on file  Food Insecurity: Not on file  Transportation Needs: Not on file  Physical Activity: Not on file  Stress: Not on file  Social Connections: Not on file    Family History  Problem Relation Age of Onset   Heart disease Father    IMPRESSION:   1. Obstructive Sleep Apnea(OSA) severe at AHI 32/h, REM  accentuated to AHI of 52.1/h.  No prolonged hypoxemia, but REM  clusters of intermittent hypoxia with heart rate increase.  2. No Periodic Limb Movement Disorder (PLMD)  3. Continuous Snoring in supine and right lateral position.  4. Normal sinus rhythm.  RECOMMENDATIONS:   1. Advise auto- CPAP titration to optimize therapy.  a dental  device is unlikely to work for this degree of apnea and REM  dependency.  Position did not play a major role either.  Auto CPAP 6-16 cm water with 1 cm EPR and mask of choice, heated  humidity.   Past Medical History:  Diagnosis Date   Coronary heart disease    2 stent placed March 2019   DDD (degenerative disc disease), cervical 11/19/2018   Difficult intubation    pt stated small airway   H/O heart artery stent    History of coronary artery disease 11/19/2018   Status post stent placement March 2019   History of seizures    No seizures in over 20 years.    Psoriasis 11/19/2018   clobetasol    Psoriatic arthritis (HCC)    On Cosentyx  since 2017    Past Surgical History:  Procedure Laterality Date   COLONOSCOPY  2007 & 2016   Normal   COLONOSCOPY WITH PROPOFOL  N/A 05/07/2021   Procedure: COLONOSCOPY WITH PROPOFOL ;  Surgeon: Tami Falcon, MD;  Location: [facility];  Service: Endoscopy;  Laterality: N/A;   CYSTOSCOPY Right 2009   Removal of Ureteric Stone   CYSTOSCOPY  12/2019   ESOPHAGOGASTRODUODENOSCOPY (EGD) WITH PROPOFOL  N/A 05/07/2021   Procedure: ESOPHAGOGASTRODUODENOSCOPY (EGD) WITH PROPOFOL ;  Surgeon: Tami Falcon, MD;  Location: [facility];  Service: Endoscopy;  Laterality: N/A;   HEMORRHOID SURGERY  2004   External hemorrhoid removed    HERNIA REPAIR     INGUINAL HERNIA REPAIR  1976   PILONIDAL CYST / SINUS EXCISION  2001   THORACOTOMY  1997   and biopsy   UPPER GI ENDOSCOPY  2007 & 2016   Normal     Current Outpatient Medications on File Prior to Visit  Medication Sig Dispense Refill   atorvastatin  (LIPITOR) 40 MG tablet Take 1 tablet (40 mg total)  by mouth daily. 90 tablet 0   clobetasol  cream (TEMOVATE ) 0.05 % Apply 1 Application topically 2 (two) times daily. (Patient taking differently: Apply 1 Application topically as needed.) 45 g 1   clopidogrel  (PLAVIX ) 75 MG tablet Take 1 tablet (75 mg total) by mouth daily. 90 tablet 3   Coenzyme Q10 200 MG capsule Take 200 mg by mouth daily.      Cyanocobalamin (VITAMIN B12) 1000 MCG TBCR Take 1,000 mg by mouth 2 (two) times a week.     diclofenac  Sodium (VOLTAREN ) 1 % GEL Apply 2-4 grams to affected joint 4 times daily as needed. 400 g 2   fluticasone (FLONASE) 50 MCG/ACT nasal spray Place into both nostrils as needed for allergies or rhinitis.     ixekizumab  (TALTZ ) 80 MG/ML pen Inject 80 mg into the skin every 28 (twenty-eight) days. 3 mL 0   loratadine (CLARITIN) 10 MG tablet Take 10 mg by mouth daily as needed for allergies.     metoprolol  succinate (TOPROL -XL) 25 MG 24 hr tablet Take 1/2 tablet (12.5 mg) by mouth daily. 45 tablet 3   Multiple Vitamin (MULTIVITAMIN PO) Take 1 tablet by mouth daily.     nitroGLYCERIN  (NITROSTAT ) 0.4 MG SL tablet Place 1 tablet (0.4 mg total) under the tongue every 5 (five) minutes as needed for chest pain. 25 tablet 3   atovaquone -proguanil (MALARONE ) 250-100 MG TABS tablet Take 1 tablet by mouth daily as directed with food or milk. 30 tablet 0   No current facility-administered medications on file prior to visit.  No Known Allergies   DIAGNOSTIC DATA (LABS, IMAGING, TESTING) - I reviewed patient records, labs, notes, testing and imaging myself where available.  Lab Results  Component Value Date   WBC 6.6 12/15/2023   HGB 13.3 12/15/2023   HCT 39.0 12/15/2023   MCV 90.5 12/15/2023   PLT 235 12/15/2023      Component Value Date/Time   NA 140 12/15/2023 0916   NA 140 01/13/2020 1211   K 4.4 12/15/2023 0916   CL 105 12/15/2023 0916   CO2 28 12/15/2023 0916   GLUCOSE 96 12/15/2023 0916   BUN 11 12/15/2023 0916   BUN 15 01/13/2020 1211    CREATININE 0.92 12/15/2023 0916   CALCIUM  9.5 12/15/2023 0916   PROT 6.8 12/15/2023 0916   PROT 7.2 01/13/2020 1211   ALBUMIN 4.4 01/13/2020 1211   AST 24 12/15/2023 0916   ALT 22 12/15/2023 0916   ALKPHOS 79 01/13/2020 1211   BILITOT 0.5 12/15/2023 0916   BILITOT 0.2 01/13/2020 1211   GFRNONAA 94 05/10/2021 1355   GFRAA 109 05/10/2021 1355   Lab Results  Component Value Date   CHOL 102 10/06/2019   HDL 37 (L) 10/06/2019   LDLCALC 45 10/06/2019   TRIG 107 10/06/2019   CHOLHDL 2.8 10/06/2019   Lab Results  Component Value Date   HGBA1C 5.7 (H) 11/22/2021   No results found for: "VITAMINB12" No results found for: "TSH"  PHYSICAL EXAM:  Today's Vitals   03/24/24 1516  BP: 126/75  Pulse: 67  Weight: 159 lb 9.6 oz (72.4 kg)  Height: 5\' 9"  (1.753 m)   Body mass index is 23.57 kg/m.   Wt Readings from Last 3 Encounters:  03/24/24 159 lb 9.6 oz (72.4 kg)  03/17/24 160 lb (72.6 kg)  02/09/24 156 lb (70.8 kg)     Ht Readings from Last 3 Encounters:  03/24/24 5\' 9"  (1.753 m)  03/17/24 5\' 9"  (1.753 m)  02/09/24 5\' 9"  (1.753 m)      General: The patient is awake, alert and appears not in acute distress. The patient is well groomed. Head: Normocephalic, atraumatic. Neck is supple. Mallampati 2,  neck circumference:15 inches .  Nasal airflow not fully patent.   Overbite Myrle Aspen is seen.  Dental status: irregular  Cardiovascular:  Regular rate and cardiac rhythm by pulse,  without distended neck veins. Respiratory: Lungs are clear to auscultation.  Skin:  Without evidence of ankle edema, or rash. Trunk: The patient's posture is erect.   NEUROLOGIC EXAM: The patient is awake and alert, oriented to place and time.   Memory subjective described as intact.  Attention span & concentration ability appears normal.  Speech is fluent,  .  Mood and affect are appropriate.   Cranial nerves: no loss of smell or taste reported  Pupils are equal. Funduscopic exam  deferred. .  Extraocular movements in vertical and horizontal planes were intact and without nystagmus.  No Diplopia. Visual fields by finger perimetry are intact. Hearing was intact to soft voice and finger rubbing.    Facial sensation intact to fine touch.  Facial motor strength is symmetric and tongue and uvula move midline.  Neck ROM : rotation, tilt and flexion extension were normal for age and shoulder shrug was symmetrical.    Motor exam:  Symmetric bulk, tone and ROM.   Normal tone without cog -wheeling, symmetric grip strength .      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  83% compliance   2) needs a new CPAP by November 2025 and I like to order a HST for baseline now. The study will be ordered as a HST off CPAP for August 2025. I prefer a watch pat study .   3)  the results should lead to a new Machine being issued at the same settings.  6-16 cm , 1 cm EPR- mask of choice.  The prescriptio should be issued for  November 2025.   RV in March 2026.   I would like to thank  Margarete Sharps, Md [address] [place],  [place] [address] for allowing me to meet with and to take care of this pleasant patient.    After spending a total time of  23  minutes face to face and additional time for physical and neurologic examination, review of laboratory studies,  personal review of imaging studies, reports and results of other testing and review of referral information / records as far as provided in visit,   Electronically signed by: [name], MD 03/24/2024 3:50 PM  [facility] and [facility] Sleep Board certified by The [employer] of Sleep Medicine and Diplomate of the [employer] of Sleep Medicine. Board certified In Neurology through the ABPN, Fellow of the [employer] of Neurology.

## 2024-04-13 ENCOUNTER — Other Ambulatory Visit: Payer: Self-pay | Admitting: Interventional Cardiology

## 2024-04-13 DIAGNOSIS — I2510 Atherosclerotic heart disease of native coronary artery without angina pectoris: Secondary | ICD-10-CM

## 2024-04-14 ENCOUNTER — Encounter (INDEPENDENT_AMBULATORY_CARE_PROVIDER_SITE_OTHER): Payer: Self-pay

## 2024-04-14 ENCOUNTER — Other Ambulatory Visit (HOSPITAL_COMMUNITY): Payer: Self-pay

## 2024-04-14 ENCOUNTER — Other Ambulatory Visit: Payer: Self-pay

## 2024-04-15 ENCOUNTER — Other Ambulatory Visit: Payer: Self-pay

## 2024-04-15 ENCOUNTER — Other Ambulatory Visit (HOSPITAL_COMMUNITY): Payer: Self-pay

## 2024-04-15 MED ORDER — ATORVASTATIN CALCIUM 40 MG PO TABS
40.0000 mg | ORAL_TABLET | Freq: Every day | ORAL | 3 refills | Status: AC
  Filled 2024-04-15: qty 90, 90d supply, fill #0
  Filled 2024-07-14: qty 90, 90d supply, fill #1
  Filled 2024-10-11: qty 90, 90d supply, fill #2

## 2024-04-15 NOTE — Progress Notes (Signed)
 Specialty Pharmacy Refill Coordination Note  Ricardo [name] is a 68 y.o. male contacted today regarding refills of specialty medication(s) Ixekizumab  (Taltz )   Patient requested Delivery   Delivery date: 04/19/24   Verified address: [address], [place], [place] [address]   Medication will be filled on 04/18/24.

## 2024-04-18 ENCOUNTER — Other Ambulatory Visit: Payer: Self-pay

## 2024-04-18 NOTE — Progress Notes (Signed)
 Patient called in to request Taltz  to be shipped to home instead of pickup. Med to be filled on  6/17 and will be delivered by 04/20/24.

## 2024-04-19 ENCOUNTER — Other Ambulatory Visit: Payer: Self-pay

## 2024-04-19 ENCOUNTER — Other Ambulatory Visit (HOSPITAL_COMMUNITY): Payer: Self-pay

## 2024-04-26 DIAGNOSIS — G4733 Obstructive sleep apnea (adult) (pediatric): Secondary | ICD-10-CM | POA: Diagnosis not present

## 2024-04-27 ENCOUNTER — Encounter: Payer: Self-pay | Admitting: Rheumatology

## 2024-04-28 ENCOUNTER — Other Ambulatory Visit: Payer: Self-pay | Admitting: *Deleted

## 2024-04-28 DIAGNOSIS — R7303 Prediabetes: Secondary | ICD-10-CM

## 2024-04-28 DIAGNOSIS — E785 Hyperlipidemia, unspecified: Secondary | ICD-10-CM

## 2024-04-28 DIAGNOSIS — Z79899 Other long term (current) drug therapy: Secondary | ICD-10-CM

## 2024-05-01 ENCOUNTER — Ambulatory Visit: Payer: Self-pay | Admitting: Physician Assistant

## 2024-05-01 LAB — QUANTIFERON-TB GOLD PLUS
Mitogen-NIL: 7.29 [IU]/mL
NIL: 0.01 [IU]/mL
QuantiFERON-TB Gold Plus: NEGATIVE
TB1-NIL: 0 [IU]/mL
TB2-NIL: 0.02 [IU]/mL

## 2024-05-01 LAB — COMPREHENSIVE METABOLIC PANEL WITH GFR
AG Ratio: 1.8 (calc) (ref 1.0–2.5)
ALT: 21 U/L (ref 9–46)
AST: 21 U/L (ref 10–35)
Albumin: 4.4 g/dL (ref 3.6–5.1)
Alkaline phosphatase (APISO): 61 U/L (ref 35–144)
BUN: 9 mg/dL (ref 7–25)
CO2: 28 mmol/L (ref 20–32)
Calcium: 9.4 mg/dL (ref 8.6–10.3)
Chloride: 104 mmol/L (ref 98–110)
Creat: 0.84 mg/dL (ref 0.70–1.35)
Globulin: 2.4 g/dL (ref 1.9–3.7)
Glucose, Bld: 93 mg/dL (ref 65–99)
Potassium: 4.4 mmol/L (ref 3.5–5.3)
Sodium: 139 mmol/L (ref 135–146)
Total Bilirubin: 0.5 mg/dL (ref 0.2–1.2)
Total Protein: 6.8 g/dL (ref 6.1–8.1)
eGFR: 96 mL/min/{1.73_m2}

## 2024-05-01 LAB — CBC WITH DIFFERENTIAL/PLATELET
Absolute Lymphocytes: 1755 {cells}/uL (ref 850–3900)
Absolute Monocytes: 679 {cells}/uL (ref 200–950)
Basophils Absolute: 62 {cells}/uL (ref 0–200)
Basophils Relative: 0.8 %
Eosinophils Absolute: 429 {cells}/uL (ref 15–500)
Eosinophils Relative: 5.5 %
HCT: 42.9 % (ref 38.5–50.0)
Hemoglobin: 13.8 g/dL (ref 13.2–17.1)
MCH: 30.1 pg (ref 27.0–33.0)
MCHC: 32.2 g/dL (ref 32.0–36.0)
MCV: 93.5 fL (ref 80.0–100.0)
MPV: 11 fL (ref 7.5–12.5)
Monocytes Relative: 8.7 %
Neutro Abs: 4875 {cells}/uL (ref 1500–7800)
Neutrophils Relative %: 62.5 %
Platelets: 222 10*3/uL (ref 140–400)
RBC: 4.59 Million/uL (ref 4.20–5.80)
RDW: 12.2 % (ref 11.0–15.0)
Total Lymphocyte: 22.5 %
WBC: 7.8 10*3/uL (ref 3.8–10.8)

## 2024-05-01 LAB — LIPID PANEL
Cholesterol: 111 mg/dL (ref <200)
HDL: 37 mg/dL — ABNORMAL LOW (ref >=40)
LDL Cholesterol (Calc): 49 mg/dL
Non-HDL Cholesterol (Calc): 74 mg/dL (ref <130)
Total CHOL/HDL Ratio: 3 (calc) (ref <5.0)
Triglycerides: 174 mg/dL — ABNORMAL HIGH (ref <150)

## 2024-05-01 LAB — HEMOGLOBIN A1C
Hgb A1c MFr Bld: 5.8 % — ABNORMAL HIGH (ref <5.7)
Mean Plasma Glucose: 120 mg/dL
eAG (mmol/L): 6.6 mmol/L

## 2024-05-04 ENCOUNTER — Other Ambulatory Visit (HOSPITAL_COMMUNITY): Payer: Self-pay

## 2024-05-12 ENCOUNTER — Other Ambulatory Visit: Payer: Self-pay

## 2024-05-12 ENCOUNTER — Ambulatory Visit: Payer: 59 | Admitting: Rheumatology

## 2024-05-12 NOTE — Progress Notes (Signed)
 Specialty Pharmacy Refill Coordination Note  Ricardo [name] is a 68 y.o. male contacted today regarding refills of specialty medication(s) Ixekizumab  (Taltz )   Patient requested Delivery   Delivery date: 05/19/24   Verified address: [address], [place], [place] [address]   Medication will be filled on 05/18/24.

## 2024-05-13 ENCOUNTER — Other Ambulatory Visit: Payer: Self-pay

## 2024-05-13 ENCOUNTER — Other Ambulatory Visit (HOSPITAL_COMMUNITY): Payer: Self-pay

## 2024-05-18 ENCOUNTER — Other Ambulatory Visit: Payer: Self-pay

## 2024-06-08 ENCOUNTER — Other Ambulatory Visit (HOSPITAL_COMMUNITY): Payer: Self-pay

## 2024-06-08 ENCOUNTER — Other Ambulatory Visit: Payer: Self-pay

## 2024-06-08 ENCOUNTER — Encounter (INDEPENDENT_AMBULATORY_CARE_PROVIDER_SITE_OTHER): Payer: Self-pay

## 2024-06-08 ENCOUNTER — Other Ambulatory Visit: Payer: Self-pay | Admitting: Rheumatology

## 2024-06-08 ENCOUNTER — Other Ambulatory Visit: Payer: Self-pay | Admitting: Pharmacy Technician

## 2024-06-08 DIAGNOSIS — L409 Psoriasis, unspecified: Secondary | ICD-10-CM

## 2024-06-08 DIAGNOSIS — Z79899 Other long term (current) drug therapy: Secondary | ICD-10-CM

## 2024-06-08 DIAGNOSIS — L4050 Arthropathic psoriasis, unspecified: Secondary | ICD-10-CM

## 2024-06-08 MED ORDER — TALTZ 80 MG/ML ~~LOC~~ SOAJ
80.0000 mg | SUBCUTANEOUS | 0 refills | Status: DC
  Filled 2024-06-08: qty 1, 28d supply, fill #0
  Filled 2024-07-08: qty 1, 28d supply, fill #1
  Filled 2024-08-08: qty 1, 28d supply, fill #2

## 2024-06-08 NOTE — Telephone Encounter (Signed)
 Last Fill: 03/03/2024  Labs: 04/28/2024 CBC and CMP WNL Hgb A1c 5.8%,HDL is borderline low and triglycerides are elevated-174.  TB Gold: 04/28/2024 Negative    Next Visit: 10/06/2024  Last Visit: 03/17/2024  DX: Psoriatic arthritis   Current Dose per office note 03/17/2024: Taltz  80 mg subcu every 28 days  Okay to refill Taltz ?

## 2024-06-08 NOTE — Progress Notes (Signed)
 Specialty Pharmacy Refill Coordination Note  Ricardo [name] is a 68 y.o. male contacted today regarding refills of specialty medication(s) Ixekizumab  (Taltz )   Patient requested (Patient-Rptd) Delivery   Delivery date: 06/17/24 Verified address: (Patient-Rptd) [address], [place], [place] [address]   Medication will be filled on 06/16/24.   RR sent to MD

## 2024-06-15 ENCOUNTER — Other Ambulatory Visit: Payer: Self-pay | Admitting: Interventional Cardiology

## 2024-06-16 ENCOUNTER — Other Ambulatory Visit: Payer: Self-pay

## 2024-06-17 ENCOUNTER — Other Ambulatory Visit (HOSPITAL_COMMUNITY): Payer: Self-pay

## 2024-06-17 MED ORDER — NITROGLYCERIN 0.4 MG SL SUBL
0.4000 mg | SUBLINGUAL_TABLET | SUBLINGUAL | 7 refills | Status: AC | PRN
Start: 2024-06-17 — End: ?
  Filled 2024-06-17: qty 25, 30d supply, fill #0
  Filled 2024-08-02: qty 25, 30d supply, fill #1

## 2024-06-28 ENCOUNTER — Ambulatory Visit (INDEPENDENT_AMBULATORY_CARE_PROVIDER_SITE_OTHER): Admitting: Neurology

## 2024-06-28 DIAGNOSIS — L4050 Arthropathic psoriasis, unspecified: Secondary | ICD-10-CM

## 2024-06-28 DIAGNOSIS — I7772 Dissection of iliac artery: Secondary | ICD-10-CM

## 2024-06-28 DIAGNOSIS — G4733 Obstructive sleep apnea (adult) (pediatric): Secondary | ICD-10-CM | POA: Diagnosis not present

## 2024-06-29 NOTE — Progress Notes (Signed)
 [name] 68 year old male 1956/09/14   HOME SLEEP TEST REPORT ( by Watch PAT)   STUDY DATE:  06-28-2024     ORDERING CLINICIAN:  [name], MD  REFERRING CLINICIAN: [name], MD    CLINICAL INFORMATION/HISTORY:  03-24-2024, [name] presented for follow up  OSA, using CPAP. 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  Chief concern according to patient :  I am doing well on  my CPAP and sleep well. 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  I am getting 7 hours of sleep.  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 He is still full time employed and commercially insured and would like to receive the new device now, rather than later. Hx of  psoriatic arthritis.Ricardo [name], M.D. 08-06-2019 Slept for 57.6% of the recorded time.  The oxygen nadir was 78% the total time spent below 89% SPO2 equal 20 minutes.  There were several arousals most of them spontaneous but 43 of them associated with respiratory events.  There were 106 obstructive apneas 1 mixed apnea and 19 shallow breathing spells-hypopneas.  This amounts to an AHI per hour of sleep of 32.1.  In REM sleep, the AHI  index was accelerated to 52.1/h and the patient briefly resumed supine sleep he was noted to snore loudly.        Epworth sleepiness score: Epworth Sleepiness Scale endorsed at 2 out of 24 points, fatigue severity scale at 11 out of 63 points, geriatric depression scale at 0 out of 15 points. Medication list reviewed,    BMI: 23.5  kg/m   Neck Circumference: 15    FINDINGS:   Sleep Summary:   Total Recording Time (hours, min):  7 h 25 m    Total Sleep Time (hours, min):      7 h 1 m           Percent REM (%):    33.3%                                      Respiratory Indices:   Calculated pAHI (per AASM  guideline):  6.3/h                       REM pAHI:   6/h                                              NREM pAHI:   6.4/h                            Positional AHI:  AHI was  15.8/h in supine and 3.5/h in left lateral sleep , which was the dominant sleep position.    Snoring:   41 db mean volume.  Oxygen Saturation Statistics:   Oxygen Saturation (%) Mean:  94% between 85-98%                          O2 Saturation (minutes) <89%:    < 1 minute        Pulse Rate Statistics:   Pulse Mean (bpm):       54 bpm           Pulse Range:     42 -96 bpm              IMPRESSION:  This HST confirms the presence of  mild sleep apnea by AASM criteria and is sleeping better when using CPAP.     (This HST  would not have shown any clinical significant apnea under CMS criteria. He is gainfully employed and not yet covered by [employer]).    RECOMMENDATION:  A new ResMed S 11 auto CPAP Machine will be issued at the same settings.  6-16 cm , 1 cm EPR- ResMed mask of patient's  choice.  The prescription should be issued for November 2025.     Any patient should be cautioned not to drive, work at heights, or operate dangerous or heavy equipment when tired or sleepy.  Review of good sleep hygiene measures is accessible to any sleep clinic patient and can be reiterated through online material- I we recommend the Guide to better Sleep   by the NIH.  Weight loss and Core Strength improvement is highly recommended for individuals with low muscle tone and/ or a BMI over 30.  Any CPAP patient should be reminded to be fully compliant with PAP therapy , (defined as using PAP therapy for more than 4 hours each night ) with the goal to improve sleep related symptoms and decrease long term cardiovascular risks. Any PAP therapy patient should be reminded, that it may take up to 3 months to get fully used to using PAP  and it may take 1-2 weeks for an established CPAP user to acclimatize to changes in pressure or mask. The earlier full compliance is achieved, the better long term compliance tends to be.   Please note that untreated obstructive sleep apnea may carry additional perioperative morbidity. Patients with significant obstructive sleep apnea should receive perioperative PAP therapy and the surgical team should be informed of the diagnosis and degree of sleep disordered breathing.  Sleep fragmentation in the presence of normal proportional sleep stages is a nonspecific findings and per se does not signify an intrinsic sleep disorder or a cause for the patient's sleep-related symptoms.  Causes include (but are not limited to) the unfamiliarity of sleeping while recorded by HST device or sleeping in a sleep lab for a full Polysomnography sleep study, but also circadian rhythm disturbances, medication side effects or an underlying mood disorder or medical problem.   The referring physician will be notified of the test results.     INTERPRETING PHYSICIAN:   Ricardo Gores, MD  [facility] and [facility] Sleep Board certified by The [employer] of Sleep Medicine and Diplomate of the [employer] of Sleep Medicine. Board certified In Neurology through the ABPN, Fellow of the [employer] of Neurology.

## 2024-07-01 DIAGNOSIS — H43813 Vitreous degeneration, bilateral: Secondary | ICD-10-CM | POA: Diagnosis not present

## 2024-07-01 DIAGNOSIS — H2513 Age-related nuclear cataract, bilateral: Secondary | ICD-10-CM | POA: Diagnosis not present

## 2024-07-01 DIAGNOSIS — H18513 Endothelial corneal dystrophy, bilateral: Secondary | ICD-10-CM | POA: Diagnosis not present

## 2024-07-06 ENCOUNTER — Ambulatory Visit: Payer: Self-pay | Admitting: Neurology

## 2024-07-06 NOTE — Procedures (Signed)
 [name] 68 year old male 1956/09/14   HOME SLEEP TEST REPORT ( by Watch PAT)   STUDY DATE:  06-28-2024     ORDERING CLINICIAN:  [name], MD  REFERRING CLINICIAN: [name], MD    CLINICAL INFORMATION/HISTORY:  03-24-2024, [name] presented for follow up  OSA, using CPAP. 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  Chief concern according to patient :  I am doing well on  my CPAP and sleep well. 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  I am getting 7 hours of sleep.  [name] CPAP will be 68 years old by November 2025 and I like to order him a new device. He is still full time employed and commercially insured and would like to receive the new device now, rather than later. Hx of  psoriatic arthritis.[name], M.D. 08-06-2019 Slept for 57.6% of the recorded time.  The oxygen nadir was 78% the total time spent below 89% SPO2 equal 20 minutes.  There were several arousals most of them spontaneous but 43 of them associated with respiratory events.  There were 106 obstructive apneas 1 mixed apnea and 19 shallow breathing spells-hypopneas.  This amounts to an AHI per hour of sleep of 32.1.  In REM sleep, the AHI  index was accelerated to 52.1/h and the patient briefly resumed supine sleep he was noted to snore loudly.        Epworth sleepiness score: Epworth Sleepiness Scale endorsed at 2 out of 24 points, fatigue severity scale at 11 out of 63 points, geriatric depression scale at 0 out of 15 points. Medication list reviewed,    BMI: 23.5  kg/m   Neck Circumference: 15    FINDINGS:   Sleep Summary:   Total Recording Time (hours, min):  7 h 25 m    Total Sleep Time (hours, min):      7 h 1 m           Percent REM (%):    33.3%                                      Respiratory Indices:   Calculated pAHI (per AASM  guideline):  6.3/h                       REM pAHI:   6/h                                              NREM pAHI:   6.4/h                            Positional AHI:  AHI was  15.8/h in supine and 3.5/h in left lateral sleep , which was the dominant sleep position.    Snoring:   41 db mean volume.  Oxygen Saturation Statistics:   Oxygen Saturation (%) Mean:  94% between 85-98%                          O2 Saturation (minutes) <89%:    < 1 minute        Pulse Rate Statistics:   Pulse Mean (bpm):       54 bpm           Pulse Range:     42 -96 bpm              IMPRESSION:  This HST confirms the presence of  mild sleep apnea by AASM criteria and is sleeping better when using CPAP.     (This HST  would not have shown any clinical significant apnea under CMS criteria. He is gainfully employed and not yet covered by [employer]).    RECOMMENDATION:  A new ResMed S 11 auto CPAP Machine will be issued at the same settings.  6-16 cm , 1 cm EPR- ResMed mask of patient's  choice.  The prescription should be issued for November 2025.     Any patient should be cautioned not to drive, work at heights, or operate dangerous or heavy equipment when tired or sleepy.  Review of good sleep hygiene measures is accessible to any sleep clinic patient and can be reiterated through online material- I we recommend the Guide to better Sleep   by the NIH.  Weight loss and Core Strength improvement is highly recommended for individuals with low muscle tone and/ or a BMI over 30.  Any CPAP patient should be reminded to be fully compliant with PAP therapy , (defined as using PAP therapy for more than 4 hours each night ) with the goal to improve sleep related symptoms and decrease long term cardiovascular risks. Any PAP therapy patient should be reminded, that it may take up to 3 months to get fully used to using PAP  and it may take 1-2 weeks for an established CPAP user to acclimatize to changes in pressure or mask. The earlier full compliance is achieved, the better long term compliance tends to be.   Please note that untreated obstructive sleep apnea may carry additional perioperative morbidity. Patients with significant obstructive sleep apnea should receive perioperative PAP therapy and the surgical team should be informed of the diagnosis and degree of sleep disordered breathing.  Sleep fragmentation in the presence of normal proportional sleep stages is a nonspecific findings and per se does not signify an intrinsic sleep disorder or a cause for the patient's sleep-related symptoms.  Causes include (but are not limited to) the unfamiliarity of sleeping while recorded by HST device or sleeping in a sleep lab for a full Polysomnography sleep study, but also circadian rhythm disturbances, medication side effects or an underlying mood disorder or medical problem.   The referring physician will be notified of the test results.     INTERPRETING PHYSICIAN:   [name], MD  [facility] Sleep Board certified by The [employer] of Sleep Medicine and Diplomate of the [employer] of Sleep Medicine. Board certified In Neurology through the ABPN, Fellow of the [employer] of Neu[employer].

## 2024-07-07 ENCOUNTER — Encounter (INDEPENDENT_AMBULATORY_CARE_PROVIDER_SITE_OTHER): Payer: Self-pay

## 2024-07-08 ENCOUNTER — Other Ambulatory Visit: Payer: Self-pay

## 2024-07-08 ENCOUNTER — Other Ambulatory Visit: Payer: Self-pay | Admitting: Pharmacy Technician

## 2024-07-08 NOTE — Progress Notes (Addendum)
 Specialty Pharmacy Refill Coordination Note  Ricardo [name] is a 68 y.o. male contacted today regarding refills of specialty medication(s) Ixekizumab  (Taltz )   Patient requested (Patient-Rptd) Delivery   Delivery date: 07/20/24 Verified address: (Patient-Rptd) [address], [place], [place] [address]   Medication will be filled on 07/19/24.

## 2024-07-19 ENCOUNTER — Other Ambulatory Visit: Payer: Self-pay

## 2024-07-22 ENCOUNTER — Encounter: Payer: Self-pay | Admitting: Neurology

## 2024-07-22 DIAGNOSIS — H2513 Age-related nuclear cataract, bilateral: Secondary | ICD-10-CM | POA: Diagnosis not present

## 2024-07-22 DIAGNOSIS — H43392 Other vitreous opacities, left eye: Secondary | ICD-10-CM | POA: Diagnosis not present

## 2024-07-22 DIAGNOSIS — H43811 Vitreous degeneration, right eye: Secondary | ICD-10-CM | POA: Diagnosis not present

## 2024-08-01 ENCOUNTER — Telehealth: Payer: Self-pay

## 2024-08-01 NOTE — Telephone Encounter (Signed)
 PA renewal initiated automatically by CoverMyMeds.  Submitted an URGENT Prior Authorization request to [facility] for TALTZ  via CoverMyMeds. Will update once we receive a response.   KEY: [id number]

## 2024-08-02 NOTE — Telephone Encounter (Signed)
 Received notification from [facility] regarding a prior authorization for TALTZ . Authorization has been APPROVED from 08/13/2024 to 08/12/2025. Approval letter sent to scan center.  Patient must continue to fill through [facility]: [phone]   Authorization # [id number]  Sherry Pennant, PharmD, MPH, BCPS, CPP Clinical Pharmacist [facility])

## 2024-08-07 ENCOUNTER — Encounter: Payer: Self-pay | Admitting: Rheumatology

## 2024-08-07 DIAGNOSIS — Z79899 Other long term (current) drug therapy: Secondary | ICD-10-CM

## 2024-08-07 DIAGNOSIS — E785 Hyperlipidemia, unspecified: Secondary | ICD-10-CM

## 2024-08-08 NOTE — Telephone Encounter (Signed)
 Yes, thank you.

## 2024-08-08 NOTE — Telephone Encounter (Signed)
 Okay to add lipid panel to standing labs this week?

## 2024-08-09 ENCOUNTER — Other Ambulatory Visit: Payer: Self-pay

## 2024-08-09 ENCOUNTER — Other Ambulatory Visit: Payer: Self-pay | Admitting: *Deleted

## 2024-08-09 ENCOUNTER — Other Ambulatory Visit (HOSPITAL_COMMUNITY): Payer: Self-pay

## 2024-08-09 DIAGNOSIS — Z79899 Other long term (current) drug therapy: Secondary | ICD-10-CM

## 2024-08-09 DIAGNOSIS — E785 Hyperlipidemia, unspecified: Secondary | ICD-10-CM

## 2024-08-09 LAB — CBC WITH DIFFERENTIAL/PLATELET
Absolute Lymphocytes: 1728 {cells}/uL (ref 850–3900)
Absolute Monocytes: 486 {cells}/uL (ref 200–950)
Basophils Absolute: 77 {cells}/uL (ref 0–200)
Basophils Relative: 1.2 %
Eosinophils Absolute: 499 {cells}/uL (ref 15–500)
Eosinophils Relative: 7.8 %
HCT: 39.8 % (ref 38.5–50.0)
Hemoglobin: 13.3 g/dL (ref 13.2–17.1)
MCH: 31.2 pg (ref 27.0–33.0)
MCHC: 33.4 g/dL (ref 32.0–36.0)
MCV: 93.4 fL (ref 80.0–100.0)
MPV: 11.1 fL (ref 7.5–12.5)
Monocytes Relative: 7.6 %
Neutro Abs: 3610 {cells}/uL (ref 1500–7800)
Neutrophils Relative %: 56.4 %
Platelets: 232 Thousand/uL (ref 140–400)
RBC: 4.26 Million/uL (ref 4.20–5.80)
RDW: 12.3 % (ref 11.0–15.0)
Total Lymphocyte: 27 %
WBC: 6.4 Thousand/uL (ref 3.8–10.8)

## 2024-08-09 LAB — LIPID PANEL
Cholesterol: 102 mg/dL (ref <200)
HDL: 36 mg/dL — ABNORMAL LOW (ref >=40)
LDL Cholesterol (Calc): 45 mg/dL
Non-HDL Cholesterol (Calc): 66 mg/dL (ref <130)
Total CHOL/HDL Ratio: 2.8 (calc) (ref <5.0)
Triglycerides: 131 mg/dL (ref <150)

## 2024-08-09 LAB — COMPREHENSIVE METABOLIC PANEL WITH GFR
AG Ratio: 1.8 (calc) (ref 1.0–2.5)
ALT: 17 U/L (ref 9–46)
AST: 18 U/L (ref 10–35)
Albumin: 4.4 g/dL (ref 3.6–5.1)
Alkaline phosphatase (APISO): 63 U/L (ref 35–144)
BUN: 14 mg/dL (ref 7–25)
CO2: 27 mmol/L (ref 20–32)
Calcium: 9.1 mg/dL (ref 8.6–10.3)
Chloride: 106 mmol/L (ref 98–110)
Creat: 0.79 mg/dL (ref 0.70–1.35)
Globulin: 2.5 g/dL (ref 1.9–3.7)
Glucose, Bld: 95 mg/dL (ref 65–99)
Potassium: 4.4 mmol/L (ref 3.5–5.3)
Sodium: 139 mmol/L (ref 135–146)
Total Bilirubin: 0.4 mg/dL (ref 0.2–1.2)
Total Protein: 6.9 g/dL (ref 6.1–8.1)
eGFR: 97 mL/min/1.73m2 (ref >=60)

## 2024-08-09 MED ORDER — COVID-19 MRNA VAC-TRIS(PFIZER) 30 MCG/0.3ML IM SUSY
0.3000 mL | PREFILLED_SYRINGE | Freq: Once | INTRAMUSCULAR | 0 refills | Status: AC
  Filled 2024-08-09: qty 0.3, 1d supply, fill #0

## 2024-08-09 NOTE — Progress Notes (Signed)
 Specialty Pharmacy Refill Coordination Note  Ricardo [name] is a 68 y.o. male contacted today regarding refills of specialty medication(s) Ixekizumab  (Taltz )   Patient requested Delivery   Delivery date: 08/11/24   Verified address: [address] [place], [address]   Medication will be filled on 08/10/24.

## 2024-08-10 ENCOUNTER — Other Ambulatory Visit: Payer: Self-pay

## 2024-08-10 ENCOUNTER — Ambulatory Visit: Payer: Self-pay | Admitting: Rheumatology

## 2024-08-10 NOTE — Progress Notes (Signed)
 CBC and CMP are normal.  HDL is low.  Triglycerides and LDL are normal.  Please forward results to his PCP and cardiologist.

## 2024-08-18 ENCOUNTER — Other Ambulatory Visit: Payer: Self-pay

## 2024-09-06 ENCOUNTER — Other Ambulatory Visit: Payer: Self-pay

## 2024-09-06 ENCOUNTER — Other Ambulatory Visit (HOSPITAL_COMMUNITY): Payer: Self-pay

## 2024-09-06 ENCOUNTER — Other Ambulatory Visit: Payer: Self-pay | Admitting: Physician Assistant

## 2024-09-06 DIAGNOSIS — Z79899 Other long term (current) drug therapy: Secondary | ICD-10-CM

## 2024-09-06 DIAGNOSIS — L4050 Arthropathic psoriasis, unspecified: Secondary | ICD-10-CM

## 2024-09-06 DIAGNOSIS — L409 Psoriasis, unspecified: Secondary | ICD-10-CM

## 2024-09-06 MED ORDER — TALTZ 80 MG/ML ~~LOC~~ SOAJ
80.0000 mg | SUBCUTANEOUS | 0 refills | Status: DC
  Filled 2024-09-06: qty 3, 84d supply, fill #0
  Filled 2024-09-08: qty 1, 28d supply, fill #0
  Filled 2024-10-05 – 2024-10-07 (×2): qty 1, 28d supply, fill #1
  Filled 2024-11-01: qty 1, 28d supply, fill #2

## 2024-09-06 NOTE — Telephone Encounter (Signed)
 Last Fill: 06/08/2024  Labs: 08/09/2024 CBC and CMP are normal. HDL is low. Triglycerides and LDL are normal.   TB Gold: 04/28/2024 negative    Next Visit: 10/06/2024  Last Visit: 03/17/2024  DX: Psoriatic arthritis   Current Dose per office note on 03/17/2024: Taltz  80 mg subcu every 28 days   Okay to refill Taltz ?

## 2024-09-07 ENCOUNTER — Encounter (INDEPENDENT_AMBULATORY_CARE_PROVIDER_SITE_OTHER): Payer: Self-pay

## 2024-09-08 ENCOUNTER — Other Ambulatory Visit: Payer: Self-pay | Admitting: Pharmacy Technician

## 2024-09-08 ENCOUNTER — Other Ambulatory Visit: Payer: Self-pay

## 2024-09-08 NOTE — Progress Notes (Signed)
 Specialty Pharmacy Refill Coordination Note  Ricardo [name] is a 68 y.o. male contacted today regarding refills of specialty medication(s) Ixekizumab  (Taltz )   Patient requested (Patient-Rptd) Delivery   Delivery date: 09/14/2024 Verified address: (Patient-Rptd) [address], [place],  [address]   Medication will be filled on: 09/13/2024

## 2024-09-13 ENCOUNTER — Other Ambulatory Visit: Payer: Self-pay

## 2024-09-13 NOTE — Progress Notes (Signed)
 Specialty Pharmacy Refill Coordination Note  Roger Kettles is a 68 y.o. male contacted today regarding refills of specialty medication(s) Ixekizumab  (Taltz )   Patient requested Delivery   Delivery date: 09/15/24   Verified address: [address], [place], [place] [address]   Medication will be filled on: 09/14/24

## 2024-09-16 DIAGNOSIS — E785 Hyperlipidemia, unspecified: Secondary | ICD-10-CM | POA: Diagnosis not present

## 2024-09-16 DIAGNOSIS — Z8611 Personal history of tuberculosis: Secondary | ICD-10-CM | POA: Diagnosis not present

## 2024-09-16 DIAGNOSIS — G4733 Obstructive sleep apnea (adult) (pediatric): Secondary | ICD-10-CM | POA: Diagnosis not present

## 2024-09-16 DIAGNOSIS — D8989 Other specified disorders involving the immune mechanism, not elsewhere classified: Secondary | ICD-10-CM | POA: Diagnosis not present

## 2024-09-16 DIAGNOSIS — L4050 Arthropathic psoriasis, unspecified: Secondary | ICD-10-CM | POA: Diagnosis not present

## 2024-09-16 DIAGNOSIS — M4306 Spondylolysis, lumbar region: Secondary | ICD-10-CM | POA: Diagnosis not present

## 2024-09-16 DIAGNOSIS — I2510 Atherosclerotic heart disease of native coronary artery without angina pectoris: Secondary | ICD-10-CM | POA: Diagnosis not present

## 2024-09-16 DIAGNOSIS — Z0000 Encounter for general adult medical examination without abnormal findings: Secondary | ICD-10-CM | POA: Diagnosis not present

## 2024-09-16 DIAGNOSIS — R82998 Other abnormal findings in urine: Secondary | ICD-10-CM | POA: Diagnosis not present

## 2024-09-16 DIAGNOSIS — M180 Bilateral primary osteoarthritis of first carpometacarpal joints: Secondary | ICD-10-CM | POA: Diagnosis not present

## 2024-09-16 DIAGNOSIS — J069 Acute upper respiratory infection, unspecified: Secondary | ICD-10-CM | POA: Diagnosis not present

## 2024-09-16 DIAGNOSIS — I7789 Other specified disorders of arteries and arterioles: Secondary | ICD-10-CM | POA: Diagnosis not present

## 2024-09-23 NOTE — Progress Notes (Signed)
 Office Visit Note  Patient: Ricardo [name]             Date of Birth: Nov 22, 1955           MRN: [id number]             PCP: Onita Rush, MD Referring: Onita Rush, MD Visit Date: 10/06/2024 Occupation: Physician  Subjective:  Medication Management   History of Present Illness: Joakim Huesman is a 68 y.o. male with psoriatic arthritis, psoriasis and osteoarthritis.  He returns today after his last visit in May 2025.  He denies having a flare of psoriatic arthritis and psoriasis.  He states he notices some discomfort after certain activities or off-and-on.  He has not noticed any joint swelling.  He has been tolerating Taltz  without any side effects.  He denies any interruption in falls.  He has not had a psoriasis flare.  He denies any history of plantar fasciitis, Achilles tendinitis, uveitis or dactylitis.    Activities of Daily Living:  Patient reports morning stiffness for 0 minutes.   Patient Denies nocturnal pain.  Difficulty dressing/grooming: Denies Difficulty climbing stairs: Denies Difficulty getting out of chair: Denies Difficulty using hands for taps, buttons, cutlery, and/or writing: Denies  Review of Systems  Constitutional:  Negative for fatigue.  HENT:  Negative for mouth sores and mouth dryness.   Eyes:  Negative for dryness.  Respiratory:  Negative for shortness of breath.   Cardiovascular:  Negative for chest pain and palpitations.  Gastrointestinal:  Negative for blood in stool, constipation and diarrhea.  Endocrine: Negative for increased urination.  Genitourinary:  Negative for involuntary urination.  Musculoskeletal:  Negative for joint pain, gait problem, joint pain, joint swelling, myalgias, muscle weakness, morning stiffness, muscle tenderness and myalgias.  Skin:  Negative for color change, rash, hair loss and sensitivity to sunlight.  Allergic/Immunologic: Negative for susceptible to infections.  Neurological:  Negative for dizziness and headaches.   Hematological:  Negative for swollen glands.  Psychiatric/Behavioral:  Negative for depressed mood and sleep disturbance. The patient is not nervous/anxious.     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   History of seizures as a child 07/21/2019   Snoring 07/21/2019   History of coronary artery disease 11/19/2018   Current use of anticoagulant therapy 11/19/2018   Psoriasis 11/19/2018   Psoriatic arthritis (HCC) 11/19/2018   High risk medication use 11/19/2018   DDD (degenerative disc disease), cervical 11/19/2018   Encounter for medication review 05/13/2018    Past Medical History:  Diagnosis Date   Coronary heart disease    2 stent placed March 2019   DDD (degenerative disc disease), cervical 11/19/2018   Difficult intubation    pt stated small airway   H/O heart artery stent    History of coronary artery disease 11/19/2018   Status post stent placement March 2019   History of seizures    No seizures in over 20 years.    Psoriasis 11/19/2018   clobetasol    Psoriatic arthritis (HCC)    On Cosentyx  since 2017    Family History  Problem Relation Age of Onset   Heart disease Father    Past Surgical History:  Procedure Laterality Date   COLONOSCOPY  2007 & 2016   Normal   COLONOSCOPY WITH PROPOFOL  N/A 05/07/2021   Procedure: COLONOSCOPY WITH PROPOFOL ;  Surgeon: Kristie Lamprey, MD;  Location: [facility];  Service: Endoscopy;  Laterality: N/A;   CYSTOSCOPY Right 2009   Removal of Ureteric Stone   CYSTOSCOPY  12/2019   ESOPHAGOGASTRODUODENOSCOPY (EGD) WITH PROPOFOL  N/A 05/07/2021   Procedure: ESOPHAGOGASTRODUODENOSCOPY (EGD) WITH PROPOFOL ;  Surgeon: Kristie Lamprey, MD;  Location: [facility];  Service: Endoscopy;  Laterality: N/A;   HEMORRHOID SURGERY  2004   External hemorrhoid removed    HERNIA REPAIR     INGUINAL HERNIA REPAIR  1976   PILONIDAL CYST / SINUS EXCISION  2001    THORACOTOMY  1997   and biopsy   UPPER GI ENDOSCOPY  2007 & 2016   Normal   Social History   Tobacco Use   Smoking status: Former    Current packs/day: 0.00    Types: Cigarettes    Quit date: 1993    Years since quitting: 32.9    Passive exposure: Never   Smokeless tobacco: Never  Vaping Use   Vaping status: Never Used  Substance Use Topics   Alcohol use: Yes    Comment: socially, rarely   Drug use: Never   Social History   Social History Narrative   Right handed   3 cups coffee per day         Immunization History  Administered Date(s) Administered   PFIZER(Purple Top)SARS-COV-2 Vaccination 10/23/2019, 11/14/2019, 07/13/2020, 07/21/2021   Pfizer(Comirnaty )Fall Seasonal Vaccine 12 years and older 09/03/2022, 07/21/2023, 08/09/2024     Objective: Vital Signs: BP 126/78 (BP Location: Left Arm, Patient Position: Sitting)   Pulse 61   Temp (!) 97.5 F (36.4 C)   Resp 12   Ht 5' 9 (1.753 m)   Wt 163 lb (73.9 kg)   BMI 24.07 kg/m    Physical Exam Vitals and nursing note reviewed.  Constitutional:      Appearance: He is well-developed.  HENT:     Head: Normocephalic and atraumatic.  Eyes:     Conjunctiva/sclera: Conjunctivae normal.     Pupils: Pupils are equal, round, and reactive to light.  Cardiovascular:     Rate and Rhythm: Normal rate and regular rhythm.     Heart sounds: Normal heart sounds.  Pulmonary:     Effort: Pulmonary effort is normal.     Breath sounds: Normal breath sounds.  Abdominal:     General: Bowel sounds are normal.     Palpations: Abdomen is soft.  Musculoskeletal:     Cervical back: Normal range of motion and neck supple.  Skin:    General: Skin is warm and dry.     Capillary Refill: Capillary refill takes less than 2 seconds.  Neurological:     Mental Status: He is alert and oriented to person, place, and time.  Psychiatric:        Behavior: Behavior normal.      Musculoskeletal Exam: Cervical, thoracic and lumbar  spine were in good range of motion.  Mild thoracic kyphosis was noted.  He was unable to reach his toes due to tight hamstrings.  There was no SI joint tenderness.  Shoulder joints, elbow joints, wrist joints, and MCPs.  No synovitis was noted.  All PIPs and DIPs were in good range of motion with no synovitis except limited extension of right third and bilateral fifth PIP joints.  Left fifth PIP joint thickening was noted..  Hip joints and knee joints were in good range of motion without any warmth swelling or effusion.  There was no tenderness over ankles or MTPs.  No plantar fasciitis or Achilles tendinitis was  noted.   CDAI Exam: CDAI Score: -- Patient Global: --; Provider Global: -- Swollen: --; Tender: -- Joint Exam 10/06/2024   No joint exam has been documented for this visit   There is currently no information documented on the homunculus. Go to the Rheumatology activity and complete the homunculus joint exam.  Investigation: No additional findings.  Imaging: No results found.  Recent Labs: Lab Results  Component Value Date   WBC 6.4 08/09/2024   HGB 13.3 08/09/2024   PLT 232 08/09/2024   NA 139 08/09/2024   K 4.4 08/09/2024   CL 106 08/09/2024   CO2 27 08/09/2024   GLUCOSE 95 08/09/2024   BUN 14 08/09/2024   CREATININE 0.79 08/09/2024   BILITOT 0.4 08/09/2024   ALKPHOS 79 01/13/2020   AST 18 08/09/2024   ALT 17 08/09/2024   PROT 6.9 08/09/2024   ALBUMIN 4.4 01/13/2020   CALCIUM  9.1 08/09/2024   GFRAA 109 05/10/2021   QFTBGOLDPLUS NEGATIVE 04/28/2024   September 16, 2024 hemoglobin A1c 5.7 Dr. Monia office   Speciality Comments: Inadequate response to methotrexate, Enbrel, Humira Cosyntex November 2017-April 2023 Taltz -03/05/22  Procedures:  No procedures performed Allergies: Patient has no known allergies.   Assessment / Plan:     Visit Diagnoses: Psoriatic arthritis (HCC) - diagnosed in 1999, treated by Dr. Darina Blanch in [place] -he had inadequate response  to methotrexate, Enbrel, Humira and Cosentyx .  He has been on Toltz since Mar 05, 2022 which has been tolerating well and.  He denies any interruption in the treatment.  He denies any side effects from the medication.  He had no active synovitis.  There was no plantar fasciitis or Achilles tendinitis.  There is no history of uveitis.  Psoriasis-he sees his dermatologist on a regular basis.  No active psoriasis patches were noted.  High risk medication use - Taltz  80 mg subcu every 28 days started on Mar 05, 2022.  Previous treatment methotrexate, Enbrel, Humira, Cosentyx .  August 09, 2024 CBC and CMP were normal.  TB Gold was negative on April 28, 2024.  He was advised to get repeat labs in a January and every 3 months.  Information reimmunization was placed in the AVS.  He has been getting immunizations through Dr. Monia office.  He was advised to hold Taltz  if he develops an infection resume after infection resolves.  Arthritis of carpometacarpal (CMC) joint of right thumb-he has intermittent discomfort in the CMC joints.  No tenderness was noted today.  Chronic pain of left knee-doing well.  Pes planus of both feet-he has noted improvement with proper fitting shoes.  Chronic left-sided low back pain without sciatica-he continues to have some lower back pain.  Current use of anticoagulant therapy  Dyslipidemia-August 09, 2024 HDL 36, triglycerides were better at 131 and LDL 45.  He has been exercising on a regular basis.  History of coronary artery disease - Status post stent placement March 2019.  Followed by cardiology.  Prediabetes-hemoglobin A1c was 5.7 on September 16, 2024 per patient.  Gastroesophageal reflux disease with esophagitis without hemorrhage  History of vitamin D  deficiency-vitamin D  was normal at 48 on November 22, 2021.  Obstructive sleep apnea syndrome  Orders: No orders of the defined types were placed in this encounter.  No orders of the defined types were placed  in this encounter.    Follow-Up Instructions: Return in about 6 months (around 04/06/2025) for Psoriatic arthritis.   Maya Nash, MD  Note - This record has been  created using [employer].  Chart creation errors have been sought, but may not always  have been located. Such creation errors do not reflect on  the standard of medical care.

## 2024-10-01 ENCOUNTER — Encounter: Payer: Self-pay | Admitting: Neurology

## 2024-10-05 ENCOUNTER — Other Ambulatory Visit: Payer: Self-pay

## 2024-10-05 ENCOUNTER — Telehealth: Payer: Self-pay

## 2024-10-05 ENCOUNTER — Other Ambulatory Visit (HOSPITAL_COMMUNITY): Payer: Self-pay

## 2024-10-05 NOTE — Telephone Encounter (Signed)
 Received notification from [facility] that pt's copay card has expired. Will send pt a MyChart message instructing him to contact the help desk for renewal.

## 2024-10-06 ENCOUNTER — Encounter: Payer: Self-pay | Admitting: Rheumatology

## 2024-10-06 ENCOUNTER — Ambulatory Visit: Attending: Rheumatology | Admitting: Rheumatology

## 2024-10-06 ENCOUNTER — Other Ambulatory Visit: Payer: Self-pay

## 2024-10-06 VITALS — BP 126/78 | HR 61 | Temp 97.5°F | Resp 12 | Ht 69.0 in | Wt 163.0 lb

## 2024-10-06 DIAGNOSIS — G4733 Obstructive sleep apnea (adult) (pediatric): Secondary | ICD-10-CM

## 2024-10-06 DIAGNOSIS — Z7901 Long term (current) use of anticoagulants: Secondary | ICD-10-CM

## 2024-10-06 DIAGNOSIS — M545 Low back pain, unspecified: Secondary | ICD-10-CM | POA: Diagnosis not present

## 2024-10-06 DIAGNOSIS — M2141 Flat foot [pes planus] (acquired), right foot: Secondary | ICD-10-CM | POA: Diagnosis not present

## 2024-10-06 DIAGNOSIS — M25562 Pain in left knee: Secondary | ICD-10-CM | POA: Diagnosis not present

## 2024-10-06 DIAGNOSIS — L409 Psoriasis, unspecified: Secondary | ICD-10-CM

## 2024-10-06 DIAGNOSIS — M1811 Unilateral primary osteoarthritis of first carpometacarpal joint, right hand: Secondary | ICD-10-CM | POA: Diagnosis not present

## 2024-10-06 DIAGNOSIS — K2100 Gastro-esophageal reflux disease with esophagitis, without bleeding: Secondary | ICD-10-CM

## 2024-10-06 DIAGNOSIS — E785 Hyperlipidemia, unspecified: Secondary | ICD-10-CM | POA: Diagnosis not present

## 2024-10-06 DIAGNOSIS — Z79899 Other long term (current) drug therapy: Secondary | ICD-10-CM

## 2024-10-06 DIAGNOSIS — L4050 Arthropathic psoriasis, unspecified: Secondary | ICD-10-CM

## 2024-10-06 DIAGNOSIS — M2142 Flat foot [pes planus] (acquired), left foot: Secondary | ICD-10-CM

## 2024-10-06 DIAGNOSIS — Z8679 Personal history of other diseases of the circulatory system: Secondary | ICD-10-CM

## 2024-10-06 DIAGNOSIS — R252 Cramp and spasm: Secondary | ICD-10-CM

## 2024-10-06 DIAGNOSIS — G8929 Other chronic pain: Secondary | ICD-10-CM

## 2024-10-06 DIAGNOSIS — R7303 Prediabetes: Secondary | ICD-10-CM

## 2024-10-06 DIAGNOSIS — Z8639 Personal history of other endocrine, nutritional and metabolic disease: Secondary | ICD-10-CM

## 2024-10-06 NOTE — Patient Instructions (Addendum)
 Standing Labs We placed an order today for your standing lab work.   Please have your standing labs drawn in January and every 3 months  Please have your labs drawn 2 weeks prior to your appointment so that the provider can discuss your lab results at your appointment, if possible.  Please note that you may see your imaging and lab results in MyChart before we have reviewed them. We will contact you once all results are reviewed. Please allow our office up to 72 hours to thoroughly review all of the results before contacting the office for clarification of your results.  WALK-IN LAB HOURS  Monday through Thursday from 8:00 am - 4:30 pm and Friday from 8:00 am-12:00 pm.  Patients with office visits requiring labs will be seen before walk-in labs.  You may encounter longer than normal wait times. Please allow additional time. Wait times may be shorter on  Monday and Thursday afternoons.  We do not book appointments for walk-in labs. We appreciate your patience and understanding with our staff.   Labs are drawn by [facility]. Please bring your co-pay at the time of your lab draw.  You may receive a bill from [facility] for your lab work.  Please note if you are on Hydroxychloroquine and and an order has been placed for a Hydroxychloroquine level,  you will need to have it drawn 4 hours or more after your last dose.  If you wish to have your labs drawn at another location, please call the office 24 hours in advance so we can fax the orders.  The office is located at [address], [address], [place], [place] [address]   If you have any questions regarding directions or hours of operation,  please call [phone].   As a reminder, please drink plenty of water  prior to coming for your lab work. Thanks!   Vaccines You are taking a medication(s) that can suppress your immune system.  The following immunizations are recommended: Flu annually Covid-19  RSV Td/Tdap (tetanus, diphtheria, pertussis)  every 10 years Pneumonia (Prevnar 15 then Pneumovax 23 at least 1 year apart.  Alternatively, can take Prevnar 20 without needing additional dose) Shingrix: 2 doses from 4 weeks to 6 months apart  Please check with your PCP to make sure you are up to date.   If you have signs or symptoms of an infection or start antibiotics: First, call your PCP for workup of your infection. Hold your medication through the infection, until you complete your antibiotics, and until symptoms resolve if you take the following: Injectable medication (Actemra, Benlysta, Cimzia, Cosentyx, Enbrel, Humira, Kevzara, Orencia, Remicade, Simponi, Stelara, Taltz, Tremfya) Methotrexate  Leflunomide (Arava) Mycophenolate (Cellcept) Xeljanz, Olumiant, or Rinvoq

## 2024-10-07 ENCOUNTER — Other Ambulatory Visit: Payer: Self-pay

## 2024-10-07 DIAGNOSIS — D1801 Hemangioma of skin and subcutaneous tissue: Secondary | ICD-10-CM | POA: Diagnosis not present

## 2024-10-07 DIAGNOSIS — L400 Psoriasis vulgaris: Secondary | ICD-10-CM | POA: Diagnosis not present

## 2024-10-07 DIAGNOSIS — Z85828 Personal history of other malignant neoplasm of skin: Secondary | ICD-10-CM | POA: Diagnosis not present

## 2024-10-07 DIAGNOSIS — L821 Other seborrheic keratosis: Secondary | ICD-10-CM | POA: Diagnosis not present

## 2024-10-07 DIAGNOSIS — B078 Other viral warts: Secondary | ICD-10-CM | POA: Diagnosis not present

## 2024-10-07 NOTE — Progress Notes (Signed)
 Patient Satisfaction Survey Completed.

## 2024-10-07 NOTE — Progress Notes (Signed)
 Specialty Pharmacy Refill Coordination Note  Ricardo [name] is a 68 y.o. male contacted today regarding refills of specialty medication(s) Ixekizumab  (Taltz )   Patient requested Delivery   Delivery date: 10/12/24   Verified address: [address] [place], [place] [address]   Medication will be filled on: 10/11/24

## 2024-10-07 NOTE — Telephone Encounter (Signed)
 Message sent to Rai to see if claim reprocesses with group number

## 2024-10-07 NOTE — Telephone Encounter (Signed)
 Claim successfully reprocessed. Rai, CPhT, will reach out to patient to shcedule shipment

## 2024-10-10 ENCOUNTER — Other Ambulatory Visit: Payer: Self-pay

## 2024-10-16 DIAGNOSIS — G4733 Obstructive sleep apnea (adult) (pediatric): Secondary | ICD-10-CM | POA: Diagnosis not present

## 2024-11-01 ENCOUNTER — Other Ambulatory Visit: Payer: Self-pay

## 2024-11-04 ENCOUNTER — Other Ambulatory Visit (HOSPITAL_COMMUNITY): Payer: Self-pay

## 2024-11-04 NOTE — Progress Notes (Signed)
 Specialty Pharmacy Refill Coordination Note  Ricardo [name] is a 69 y.o. male contacted today regarding refills of specialty medication(s) Ixekizumab  (Taltz )   Patient requested Delivery   Delivery date: 11/18/24   Verified address: [address] [place], [place] [address]   Medication will be filled on: 11/17/24

## 2024-11-07 NOTE — Progress Notes (Deleted)
 SABRA

## 2024-11-08 ENCOUNTER — Ambulatory Visit: Admitting: Neurology

## 2024-11-10 ENCOUNTER — Telehealth: Payer: Self-pay | Admitting: Neurology

## 2024-11-10 NOTE — Telephone Encounter (Signed)
 Patient rescheduled Initial CPAP with NP due to scheduling conflict.

## 2024-11-17 ENCOUNTER — Other Ambulatory Visit: Payer: Self-pay

## 2024-11-28 ENCOUNTER — Encounter: Payer: Self-pay | Admitting: Neurology

## 2024-11-28 ENCOUNTER — Other Ambulatory Visit: Payer: Self-pay

## 2024-11-28 ENCOUNTER — Encounter: Payer: Self-pay | Admitting: Rheumatology

## 2024-11-28 DIAGNOSIS — L4050 Arthropathic psoriasis, unspecified: Secondary | ICD-10-CM

## 2024-11-28 DIAGNOSIS — Z79899 Other long term (current) drug therapy: Secondary | ICD-10-CM

## 2024-11-28 NOTE — Progress Notes (Signed)
 Labs already pended. Opened in error.

## 2024-11-29 ENCOUNTER — Ambulatory Visit: Admitting: Neurology

## 2024-11-29 NOTE — Progress Notes (Unsigned)
 Ricardo [name]

## 2024-11-30 ENCOUNTER — Encounter: Payer: Self-pay | Admitting: Neurology

## 2024-11-30 ENCOUNTER — Other Ambulatory Visit: Payer: Self-pay | Admitting: *Deleted

## 2024-11-30 ENCOUNTER — Ambulatory Visit: Admitting: Neurology

## 2024-11-30 VITALS — Ht 69.0 in | Wt 163.0 lb

## 2024-11-30 DIAGNOSIS — J069 Acute upper respiratory infection, unspecified: Secondary | ICD-10-CM | POA: Insufficient documentation

## 2024-11-30 DIAGNOSIS — Z79899 Other long term (current) drug therapy: Secondary | ICD-10-CM

## 2024-11-30 DIAGNOSIS — G4733 Obstructive sleep apnea (adult) (pediatric): Secondary | ICD-10-CM | POA: Diagnosis not present

## 2024-11-30 DIAGNOSIS — R739 Hyperglycemia, unspecified: Secondary | ICD-10-CM | POA: Insufficient documentation

## 2024-11-30 NOTE — Progress Notes (Signed)
 "  Patient: Ricardo [name] Date of Birth: 1956-01-08  Reason for Visit: Follow up History from: Patient Primary Neurologist: Dohmeier  ASSESSMENT AND PLAN 69 y.o. year old male   1.  OSA on CPAP - Excellent CPAP usage! - Continue CPAP nightly minimum 4 hours - Continue current settings - Continue to replace supplies routinely through DME - Follow-up in 1 year  -HST 06/28/24 for a new CPAP machine showed mild sleep apnea, excellent benefit with CPAP. Setup 09/16/24   HISTORY OF PRESENT ILLNESS: Today 11/30/24 11/30/24 SS: HST 06/28/24 for a new CPAP machine showed mild sleep apnea, excellent benefit with CPAP. Setup 09/16/24 new CPAP. CPAP report shows 100% usage > 4 hours, 6-16 cm water, leak 21.3, AHI 0.1. Pleased with CPAP, it is smaller.  Using nasal pillow mask.  He no longer snores. His wife is pleased.  Sleeping well, feels well rested with CPAP.  Has no issues with CPAP.  HISTORY  03/24/24 Dr. 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 male patient who is here for a Sleep Medicine revisit on 03/24/2024 , carrying a dx of severe OSA and using CPAP compliantly.  Chief concern according to patient :  I am doing well on  my CPAP and sleep well. 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  I am getting 7 hours of sleep.   [name] CPAP will be 69 years old by November 2025 and I like to order him a new device. He is still full time employed and commercially insured and would like to receive the new device now, rather than later.  REVIEW OF SYSTEMS: Out of a complete 14 system review of symptoms, the patient complains only of the following symptoms, and all other reviewed systems are negative.  See HPI  ALLERGIES: Allergies[1]  HOME MEDICATIONS: Outpatient Medications Prior to Visit  Medication Sig Dispense Refill   atorvastatin  (LIPITOR) 40 MG tablet Take 1 tablet (40 mg total) by mouth daily. 90 tablet 3   clobetasol  cream  (TEMOVATE ) 0.05 % Apply 1 Application topically 2 (two) times daily. (Patient taking differently: Apply 1 Application topically as needed.) 45 g 1   clopidogrel  (PLAVIX ) 75 MG tablet Take 1 tablet (75 mg total) by mouth daily. 90 tablet 3   Coenzyme Q10 200 MG capsule Take 200 mg by mouth daily.      Cyanocobalamin (VITAMIN B12) 1000 MCG TBCR Take 1,000 mg by mouth 2 (two) times a week.     fluticasone (FLONASE) 50 MCG/ACT nasal spray Place into both nostrils as needed for allergies or rhinitis.     ixekizumab  (TALTZ ) 80 MG/ML pen Inject 80 mg into the skin every 28 (twenty-eight) days. 3 mL 0   loratadine (CLARITIN) 10 MG tablet Take 10 mg by mouth daily as needed for allergies.     metoprolol  succinate (TOPROL -XL) 25 MG 24 hr tablet Take 1/2 tablet (12.5 mg) by mouth daily. 45 tablet 3   Multiple Vitamin (MULTIVITAMIN PO) Take 1 tablet by mouth daily.     nitroGLYCERIN  (NITROSTAT ) 0.4 MG SL tablet Place 1 tablet (0.4 mg total) under the tongue every 5 (five) minutes as needed for chest pain. 25 tablet 7   diclofenac  Sodium (VOLTAREN ) 1 % GEL Apply 2-4 grams to affected joint 4 times daily as needed. (Patient not taking: Reported on 11/30/2024) 400 g 2   No facility-administered medications prior to visit.    PAST  MEDICAL HISTORY: Past Medical History:  Diagnosis Date   Coronary heart disease    2 stent placed March 2019   DDD (degenerative disc disease), cervical 11/19/2018   Difficult intubation    pt stated small airway   H/O heart artery stent    History of coronary artery disease 11/19/2018   Status post stent placement March 2019   History of seizures    No seizures in over 20 years.    Psoriasis 11/19/2018   clobetasol    Psoriatic arthritis (HCC)    On Cosentyx  since 2017    PAST SURGICAL HISTORY: Past Surgical History:  Procedure Laterality Date   COLONOSCOPY  2007 & 2016   Normal   COLONOSCOPY WITH PROPOFOL  N/A 05/07/2021   Procedure: COLONOSCOPY WITH PROPOFOL ;   Surgeon: Kristie Lamprey, MD;  Location: [facility];  Service: Endoscopy;  Laterality: N/A;   CYSTOSCOPY Right 2009   Removal of Ureteric Stone   CYSTOSCOPY  12/2019   ESOPHAGOGASTRODUODENOSCOPY (EGD) WITH PROPOFOL  N/A 05/07/2021   Procedure: ESOPHAGOGASTRODUODENOSCOPY (EGD) WITH PROPOFOL ;  Surgeon: Kristie Lamprey, MD;  Location: [facility];  Service: Endoscopy;  Laterality: N/A;   HEMORRHOID SURGERY  2004   External hemorrhoid removed    HERNIA REPAIR     INGUINAL HERNIA REPAIR  1976   PILONIDAL CYST / SINUS EXCISION  2001   THORACOTOMY  1997   and biopsy   UPPER GI ENDOSCOPY  2007 & 2016   Normal    FAMILY HISTORY: Family History  Problem Relation Age of Onset   Heart disease Father     SOCIAL HISTORY: Social History   Socioeconomic History   Marital status: Married    Spouse name: Not on file   Number of children: 0   Years of education: Not on file   Highest education level: Not on file  Occupational History   Not on file  Tobacco Use   Smoking status: Former    Current packs/day: 0.00    Types: Cigarettes    Quit date: 1993    Years since quitting: 33.0    Passive exposure: Never   Smokeless tobacco: Never  Vaping Use   Vaping status: Never Used  Substance and Sexual Activity   Alcohol  use: Yes    Comment: socially, rarely   Drug use: Never   Sexual activity: Not on file  Other Topics Concern   Not on file  Social History Narrative   Right handed   3 cups coffee per day       Social Drivers of Health   Tobacco Use: Medium Risk (11/30/2024)   Patient History    Smoking Tobacco Use: Former    Smokeless Tobacco Use: Never    Passive Exposure: Never  Programmer, Applications: Not on [employer] Insecurity: Not on file  Transportation Needs: Not on file  Physical Activity: Not on file  Stress: Not on file  Social Connections: Not on file  Intimate Partner Violence: Not on file  Depression (PHQ2-9): Not on file  Alcohol  Screen: Not on file  Housing:  Not on file  Utilities: Not on file  Health Literacy: Not on file    PHYSICAL EXAM  Vitals:   11/30/24 1031  Weight: 163 lb (73.9 kg)  Height: 5' 9 (1.753 m)   Body mass index is 24.07 kg/m.  Generalized: Well developed, in no acute distress  Neurological examination  Mentation: Alert oriented to time, place, history taking. Follows all commands speech and language fluent Motor: Moves  all extremities independent Gait and station: Gait is normal.    DIAGNOSTIC DATA (LABS, IMAGING, TESTING) - I reviewed patient records, labs, notes, testing and imaging myself where available.  Lab Results  Component Value Date   WBC 6.4 08/09/2024   HGB 13.3 08/09/2024   HCT 39.8 08/09/2024   MCV 93.4 08/09/2024   PLT 232 08/09/2024      Component Value Date/Time   NA 139 08/09/2024 0806   NA 140 01/13/2020 1211   K 4.4 08/09/2024 0806   CL 106 08/09/2024 0806   CO2 27 08/09/2024 0806   GLUCOSE 95 08/09/2024 0806   BUN 14 08/09/2024 0806   BUN 15 01/13/2020 1211   CREATININE 0.79 08/09/2024 0806   CALCIUM  9.1 08/09/2024 0806   PROT 6.9 08/09/2024 0806   PROT 7.2 01/13/2020 1211   ALBUMIN 4.4 01/13/2020 1211   AST 18 08/09/2024 0806   ALT 17 08/09/2024 0806   ALKPHOS 79 01/13/2020 1211   BILITOT 0.4 08/09/2024 0806   BILITOT 0.2 01/13/2020 1211   GFRNONAA 94 05/10/2021 1355   GFRAA 109 05/10/2021 1355   Lab Results  Component Value Date   CHOL 102 08/09/2024   HDL 36 (L) 08/09/2024   LDLCALC 45 08/09/2024   TRIG 131 08/09/2024   CHOLHDL 2.8 08/09/2024   Lab Results  Component Value Date   HGBA1C 5.8 (H) 04/28/2024   No results found for: VITAMINB12 No results found for: TSH  Lauraine Born, AGNP-C, DNP 11/30/2024, 10:54 AM [facility] [address] [place], [place] [address] [phone]     [1] No Known Allergies  "

## 2024-11-30 NOTE — Patient Instructions (Signed)
 Great to see you today! Continue CPAP usage minimum 4 hours nightly Continue current settings Continue to replace supplies routinely through DME Follow-up in 1 year or sooner if needed. Thanks!!

## 2024-12-01 ENCOUNTER — Ambulatory Visit: Payer: Self-pay | Admitting: Physician Assistant

## 2024-12-01 LAB — COMPREHENSIVE METABOLIC PANEL WITH GFR
AG Ratio: 1.8 (calc) (ref 1.0–2.5)
ALT: 20 U/L (ref 9–46)
AST: 19 U/L (ref 10–35)
Albumin: 4.6 g/dL (ref 3.6–5.1)
Alkaline phosphatase (APISO): 64 U/L (ref 35–144)
BUN: 13 mg/dL (ref 7–25)
CO2: 31 mmol/L (ref 20–32)
Calcium: 9.3 mg/dL (ref 8.6–10.3)
Chloride: 103 mmol/L (ref 98–110)
Creat: 0.82 mg/dL (ref 0.70–1.35)
Globulin: 2.5 g/dL (ref 1.9–3.7)
Glucose, Bld: 101 mg/dL — ABNORMAL HIGH (ref 65–99)
Potassium: 4.1 mmol/L (ref 3.5–5.3)
Sodium: 140 mmol/L (ref 135–146)
Total Bilirubin: 0.4 mg/dL (ref 0.2–1.2)
Total Protein: 7.1 g/dL (ref 6.1–8.1)
eGFR: 96 mL/min/{1.73_m2}

## 2024-12-01 LAB — CBC WITH DIFFERENTIAL/PLATELET
Absolute Lymphocytes: 2657 {cells}/uL (ref 850–3900)
Absolute Monocytes: 437 {cells}/uL (ref 200–950)
Basophils Absolute: 81 {cells}/uL (ref 0–200)
Basophils Relative: 1.1 %
Eosinophils Absolute: 392 {cells}/uL (ref 15–500)
Eosinophils Relative: 5.3 %
HCT: 41.3 % (ref 39.4–51.1)
Hemoglobin: 13.8 g/dL (ref 13.2–17.1)
MCH: 30.5 pg (ref 27.0–33.0)
MCHC: 33.4 g/dL (ref 31.6–35.4)
MCV: 91.4 fL (ref 81.4–101.7)
MPV: 11.1 fL (ref 7.5–12.5)
Monocytes Relative: 5.9 %
Neutro Abs: 3833 {cells}/uL (ref 1500–7800)
Neutrophils Relative %: 51.8 %
Platelets: 236 10*3/uL (ref 140–400)
RBC: 4.52 Million/uL (ref 4.20–5.80)
RDW: 12 % (ref 11.0–15.0)
Total Lymphocyte: 35.9 %
WBC: 7.4 10*3/uL (ref 3.8–10.8)

## 2024-12-08 ENCOUNTER — Other Ambulatory Visit: Payer: Self-pay | Admitting: Rheumatology

## 2024-12-08 ENCOUNTER — Other Ambulatory Visit: Payer: Self-pay

## 2024-12-08 DIAGNOSIS — L4050 Arthropathic psoriasis, unspecified: Secondary | ICD-10-CM

## 2024-12-08 DIAGNOSIS — L409 Psoriasis, unspecified: Secondary | ICD-10-CM

## 2024-12-08 DIAGNOSIS — Z79899 Other long term (current) drug therapy: Secondary | ICD-10-CM

## 2024-12-08 MED ORDER — TALTZ 80 MG/ML ~~LOC~~ SOAJ
80.0000 mg | SUBCUTANEOUS | 0 refills | Status: AC
  Filled 2024-12-09: qty 1, 28d supply, fill #0
  Filled 2024-12-09: qty 3, 84d supply, fill #0

## 2024-12-08 NOTE — Telephone Encounter (Signed)
 Last Fill: 09/06/2024  Labs: 11/30/2024 CBC and CMP WNL   TB Gold: 04/28/2024 negative    Next Visit: 04/13/2025  Last Visit: 10/06/2024  IK:Ednmpjupr arthritis   Current Dose per office note on 10/06/2024: Taltz  80 mg subcu every 28 days started on Mar 05, 2022.   Okay to refill Taltz ?

## 2024-12-09 ENCOUNTER — Other Ambulatory Visit: Payer: Self-pay

## 2024-12-09 NOTE — Progress Notes (Signed)
 Specialty Pharmacy Refill Coordination Note  Ricardo [name] is a 69 y.o. male contacted today regarding refills of specialty medication(s) Ixekizumab  (Taltz )   Patient requested Delivery   Delivery date: 12/20/24   Verified address: [address], [place], [place] [address]   Medication will be filled on: 12/19/24

## 2025-02-28 ENCOUNTER — Ambulatory Visit: Admitting: Cardiovascular Disease

## 2025-04-13 ENCOUNTER — Ambulatory Visit: Admitting: Rheumatology

## 2025-11-30 ENCOUNTER — Ambulatory Visit: Admitting: Neurology
